# Patient Record
Sex: Female | Born: 1950
Health system: Southern US, Community
[De-identification: ages and names within clinical notes are randomized; demographics above are authoritative.]

## PROBLEM LIST (undated history)

## (undated) ENCOUNTER — Inpatient Hospital Stay: Admission: EM | Payer: Self-pay | Source: Home / Self Care

## (undated) DIAGNOSIS — Z923 Personal history of irradiation: Secondary | ICD-10-CM

## (undated) DIAGNOSIS — IMO0001 Reserved for inherently not codable concepts without codable children: Secondary | ICD-10-CM

## (undated) DIAGNOSIS — K08109 Complete loss of teeth, unspecified cause, unspecified class: Secondary | ICD-10-CM

## (undated) DIAGNOSIS — Z973 Presence of spectacles and contact lenses: Secondary | ICD-10-CM

## (undated) DIAGNOSIS — IMO0002 Reserved for concepts with insufficient information to code with codable children: Secondary | ICD-10-CM

## (undated) DIAGNOSIS — Z972 Presence of dental prosthetic device (complete) (partial): Secondary | ICD-10-CM

## (undated) DIAGNOSIS — M1990 Unspecified osteoarthritis, unspecified site: Secondary | ICD-10-CM

## (undated) DIAGNOSIS — C801 Malignant (primary) neoplasm, unspecified: Secondary | ICD-10-CM

## (undated) HISTORY — DX: Reserved for concepts with insufficient information to code with codable children: IMO0002

## (undated) HISTORY — PX: TUBAL LIGATION: SHX77

## (undated) HISTORY — DX: Reserved for inherently not codable concepts without codable children: IMO0001

## (undated) HISTORY — PX: APPENDECTOMY: SHX54

## (undated) HISTORY — DX: Unspecified osteoarthritis, unspecified site: M19.90

---

## 1999-03-10 ENCOUNTER — Other Ambulatory Visit: Admission: RE | Admit: 1999-03-10 | Discharge: 1999-03-10 | Payer: Self-pay | Admitting: Obstetrics

## 1999-03-23 ENCOUNTER — Ambulatory Visit (HOSPITAL_COMMUNITY): Admission: RE | Admit: 1999-03-23 | Discharge: 1999-03-23 | Payer: Self-pay | Admitting: Obstetrics

## 1999-03-23 ENCOUNTER — Encounter: Payer: Self-pay | Admitting: Obstetrics

## 2000-11-01 ENCOUNTER — Other Ambulatory Visit: Admission: RE | Admit: 2000-11-01 | Discharge: 2000-11-01 | Payer: Self-pay | Admitting: Obstetrics

## 2000-11-08 ENCOUNTER — Ambulatory Visit (HOSPITAL_COMMUNITY): Admission: RE | Admit: 2000-11-08 | Discharge: 2000-11-08 | Payer: Self-pay | Admitting: Obstetrics

## 2000-11-08 ENCOUNTER — Encounter: Payer: Self-pay | Admitting: Obstetrics

## 2002-07-15 ENCOUNTER — Emergency Department (HOSPITAL_COMMUNITY): Admission: EM | Admit: 2002-07-15 | Discharge: 2002-07-15 | Payer: Self-pay | Admitting: Emergency Medicine

## 2002-07-15 ENCOUNTER — Encounter: Payer: Self-pay | Admitting: Emergency Medicine

## 2003-07-13 ENCOUNTER — Emergency Department (HOSPITAL_COMMUNITY): Admission: EM | Admit: 2003-07-13 | Discharge: 2003-07-13 | Payer: Self-pay | Admitting: Emergency Medicine

## 2005-01-31 ENCOUNTER — Emergency Department (HOSPITAL_COMMUNITY): Admission: EM | Admit: 2005-01-31 | Discharge: 2005-01-31 | Payer: Self-pay | Admitting: *Deleted

## 2010-01-30 ENCOUNTER — Emergency Department (HOSPITAL_COMMUNITY): Admission: EM | Admit: 2010-01-30 | Discharge: 2010-01-30 | Payer: Self-pay | Admitting: Emergency Medicine

## 2011-08-01 ENCOUNTER — Emergency Department (HOSPITAL_COMMUNITY)
Admission: EM | Admit: 2011-08-01 | Discharge: 2011-08-01 | Disposition: A | Discharge status: Home / Self Care | Payer: Self-pay | Attending: Emergency Medicine | Admitting: Emergency Medicine

## 2011-08-01 DIAGNOSIS — M545 Low back pain, unspecified: Secondary | ICD-10-CM | POA: Insufficient documentation

## 2012-06-15 ENCOUNTER — Emergency Department (HOSPITAL_BASED_OUTPATIENT_CLINIC_OR_DEPARTMENT_OTHER): Payer: BC Managed Care – PPO

## 2012-06-15 ENCOUNTER — Emergency Department (HOSPITAL_BASED_OUTPATIENT_CLINIC_OR_DEPARTMENT_OTHER)
Admission: EM | Admit: 2012-06-15 | Discharge: 2012-06-15 | Disposition: A | Discharge status: Home / Self Care | Payer: BC Managed Care – PPO | Attending: Emergency Medicine | Admitting: Emergency Medicine

## 2012-06-15 ENCOUNTER — Encounter (HOSPITAL_BASED_OUTPATIENT_CLINIC_OR_DEPARTMENT_OTHER): Payer: Self-pay | Admitting: *Deleted

## 2012-06-15 DIAGNOSIS — S92501A Displaced unspecified fracture of right lesser toe(s), initial encounter for closed fracture: Secondary | ICD-10-CM

## 2012-06-15 DIAGNOSIS — W2209XA Striking against other stationary object, initial encounter: Secondary | ICD-10-CM | POA: Insufficient documentation

## 2012-06-15 DIAGNOSIS — S92919A Unspecified fracture of unspecified toe(s), initial encounter for closed fracture: Secondary | ICD-10-CM | POA: Insufficient documentation

## 2012-06-15 MED ORDER — TRAMADOL HCL 50 MG PO TABS: 50.0000 mg | ORAL_TABLET | Freq: Four times a day (QID) | ORAL | Status: AC | PRN

## 2012-06-15 NOTE — ED Provider Notes (Signed)
Medical screening examination/treatment/procedure(s) were performed by non-physician practitioner and as supervising physician I was immediately available for consultation/collaboration.    Cyndra Numbers, MD 06/15/12 [phone]

## 2012-06-15 NOTE — ED Notes (Signed)
3am she hit her foot against a wall on the way from the bathroom. Right 3rd and 4th toes are painful. Drove herself here.

## 2012-06-15 NOTE — ED Provider Notes (Signed)
History     CSN: [id number]  Arrival date & time 06/15/12  1646   First MD Initiated Contact with Patient 06/15/12 1648      Chief Complaint  Patient presents with  . Foot Pain    (Consider location/radiation/quality/duration/timing/severity/associated sxs/prior treatment) HPI Hx from pt. [name] [name] is a 61 y.o. female presenting with pain to her right third and fourth toes. She states that she was walking back to her bedroom from the bathroom last evening when she hit her foot against the baseboard. She has had persistent pain to the toes since then. She has been able to weight-bear and walk. Pain worsens with these actions. Improves with rest. She has been soaking the foot in Epsom salt and has taken Aleve once with minimal relief of her symptoms. She has not tried ice. Pain aching and constant. No radiation.  History reviewed. No pertinent past medical history.  History reviewed. No pertinent past surgical history.  No family history on file.  History  Substance Use Topics  . Smoking status: Never Smoker   . Smokeless tobacco: Not on file  . Alcohol Use: No    OB History    Grav Para Term Preterm Abortions TAB SAB Ect Mult Living                  Review of Systems as per history of present illness  Allergies  Review of patient's allergies indicates not on file.  Home Medications  No current outpatient prescriptions on file.  BP 135/67  Pulse 66  Temp 98 F (36.7 C) (Oral)  Resp 20  SpO2 100%  Physical Exam  Nursing note and vitals reviewed. Constitutional: She appears well-developed and well-nourished. No distress.  HENT:  Head: Normocephalic and atraumatic.  Neck: Normal range of motion.  Cardiovascular: Normal rate.   Pulmonary/Chest: Effort normal.  Musculoskeletal: Normal range of motion.       R foot: No visible swelling, bruising, or deformity. DP/PT pulses intact. TTP to 3rd and 4th toes, worse to 4th toe. Sensation intact to lt touch. Able  to wiggle toes and wt bear.  Neurological: She is alert.  Skin: Skin is warm and dry. She is not diaphoretic.  Psychiatric: She has a normal mood and affect.    ED Course  Procedures (including critical care time)  Labs Reviewed - No data to display Dg Foot Complete Right  06/15/2012  *RADIOLOGY REPORT*  Clinical Data: History of right foot pain after injury.  RIGHT FOOT COMPLETE - 3+ VIEW  Comparison: None.  Findings: Acute oblique fracture of the diaphysis of the proximal phalanx of the right fourth toe is present.  There is near apposition at the fracture site.  There is near anatomic alignment. There is slight lateral displacement of the distal fracture fragment.  No comminution, dislocation, or other fracture is seen.  IMPRESSION: Fracture of the proximal phalanx of right fourth toe.   Original Report Authenticated By: Crawford Givens, M.D.      No diagnosis found.    MDM  Patient with right third and fourth toe pain after striking the foot on a wall. She has no visible deformity to the foot but is tender to palpation to the third and fourth toes. X-rays reviewed by me and show oblique fracture of the proximal fourth phalanx. Patient's toes buddy taped; she was placed in postop shoe. To f/u with ortho prn. Reasons to return discussed.  Grant Fontana, PA-C 06/15/12 1722

## 2013-04-05 ENCOUNTER — Ambulatory Visit (INDEPENDENT_AMBULATORY_CARE_PROVIDER_SITE_OTHER): Payer: BC Managed Care – PPO | Admitting: Physician Assistant

## 2013-04-05 VITALS — BP 118/70 | HR 77 | Temp 98.0°F | Resp 18 | Ht 64.0 in | Wt 161.8 lb

## 2013-04-05 DIAGNOSIS — H6120 Impacted cerumen, unspecified ear: Secondary | ICD-10-CM

## 2013-04-05 DIAGNOSIS — H6123 Impacted cerumen, bilateral: Secondary | ICD-10-CM

## 2013-04-05 NOTE — Progress Notes (Signed)
  Subjective:    Patient ID: [name] [name], female    DOB: 04/21/1951, 62 y.o.   MRN: [id number]  HPI   [name] [name] is a very pleasant 62 yr old female here because her ears have been stopped up for about a week.  Denies pain, states the ears just feel full.  Unable to pop them.  No URI symptoms  No air travel or scuba diving.  No fever, chills.  No drainage from the ear.  Had something similar about 2 yrs ago.  Has tried otc drops without success.     Review of Systems  Constitutional: Negative for fever and chills.  HENT: Positive for hearing loss and ear pain. Negative for congestion, rhinorrhea and ear discharge.   Respiratory: Negative for cough.   Cardiovascular: Negative.   Gastrointestinal: Negative.   Musculoskeletal: Negative.   Skin: Negative.   Neurological: Negative.        Objective:   Physical Exam  Vitals reviewed. Constitutional: She is oriented to person, place, and time. She appears well-developed and well-nourished. No distress.  HENT:  Head: Normocephalic and atraumatic.  Right Ear: External ear normal.  Left Ear: External ear normal.  bilat ear canals completely occluded with cerumen  Eyes: Conjunctivae are normal. No scleral icterus.  Cardiovascular: Normal rate, regular rhythm and normal heart sounds.   Pulmonary/Chest: Effort normal. She has no wheezes. She has no rales.  Neurological: She is alert and oriented to person, place, and time.  Skin: Skin is warm and dry.  Psychiatric: She has a normal mood and affect. Her behavior is normal.     Bilateral ears irrigated successfully; canals now patent, TMs intact, subjective relief of symptoms     Assessment & Plan:  Cerumen impaction, bilateral   [name] [name] is a very pleasant 62 yr old female with bilateral cerumen impaction.  Ears were successfully irrigated with resolution of symptoms.  Canals patent and TMs clear, no evidence of infection.  Follow up prn.

## 2013-09-26 ENCOUNTER — Other Ambulatory Visit: Payer: Self-pay | Admitting: Family Medicine

## 2013-09-26 DIAGNOSIS — Z1231 Encounter for screening mammogram for malignant neoplasm of breast: Secondary | ICD-10-CM

## 2013-10-29 ENCOUNTER — Ambulatory Visit
Admission: RE | Admit: 2013-10-29 | Discharge: 2013-10-29 | Disposition: A | Discharge status: Home / Self Care | Payer: BC Managed Care – PPO | Source: Ambulatory Visit | Attending: Family Medicine | Admitting: Family Medicine

## 2013-10-29 DIAGNOSIS — Z1231 Encounter for screening mammogram for malignant neoplasm of breast: Secondary | ICD-10-CM

## 2013-10-31 ENCOUNTER — Other Ambulatory Visit: Payer: Self-pay | Admitting: Family Medicine

## 2013-10-31 DIAGNOSIS — R928 Other abnormal and inconclusive findings on diagnostic imaging of breast: Secondary | ICD-10-CM

## 2013-11-18 ENCOUNTER — Other Ambulatory Visit: Payer: Self-pay | Admitting: Family Medicine

## 2013-11-18 ENCOUNTER — Ambulatory Visit
Admission: RE | Admit: 2013-11-18 | Discharge: 2013-11-18 | Disposition: A | Discharge status: Home / Self Care | Payer: BC Managed Care – PPO | Source: Ambulatory Visit | Attending: Physician Assistant | Admitting: Physician Assistant

## 2013-11-18 DIAGNOSIS — R928 Other abnormal and inconclusive findings on diagnostic imaging of breast: Secondary | ICD-10-CM

## 2013-11-18 LAB — PROCEDURE REPORT - SCANNED: PAP SMEAR: NEGATIVE

## 2013-11-19 ENCOUNTER — Ambulatory Visit
Admission: RE | Admit: 2013-11-19 | Discharge: 2013-11-19 | Disposition: A | Discharge status: Home / Self Care | Payer: BC Managed Care – PPO | Source: Ambulatory Visit | Attending: Physician Assistant | Admitting: Physician Assistant

## 2013-11-19 DIAGNOSIS — R928 Other abnormal and inconclusive findings on diagnostic imaging of breast: Secondary | ICD-10-CM

## 2013-11-27 ENCOUNTER — Ambulatory Visit (INDEPENDENT_AMBULATORY_CARE_PROVIDER_SITE_OTHER): Payer: BC Managed Care – PPO | Admitting: General Surgery

## 2013-11-27 ENCOUNTER — Encounter (INDEPENDENT_AMBULATORY_CARE_PROVIDER_SITE_OTHER): Payer: Self-pay | Admitting: General Surgery

## 2013-11-27 VITALS — BP 120/70 | HR 64 | Resp 18 | Ht 64.0 in | Wt 162.8 lb

## 2013-11-27 DIAGNOSIS — N63 Unspecified lump in unspecified breast: Secondary | ICD-10-CM

## 2013-11-27 DIAGNOSIS — N6310 Unspecified lump in the right breast, unspecified quadrant: Secondary | ICD-10-CM

## 2013-11-27 NOTE — Patient Instructions (Signed)
You had developed a large mass in your right breast laterally.  Recent biopsy shows atypical cells, but there  no obvious cancer cells were  seen.   This may or may not be a breast cancer.  The next step is to conservatively excise this area as described and have it completely analyzed in the pathology lab.  Further decisions will be made based on those lab tests.  You'll be scheduled for a right lumpectomy in the near future.     Lumpectomy A lumpectomy is a form of "breast conserving" or "breast preservation" surgery. It may also be referred to as a partial mastectomy. During a lumpectomy, the portion of the breast that contains the cancerous tumor or breast mass (the lump) is removed. Some normal tissue around the lump may also be removed to make sure all the tumor has been removed. This surgery should take 40 minutes or less. LET [facility] CARE PROVIDER KNOW ABOUT:  Any allergies you have.  All medicines you are taking, including vitamins, herbs, eye drops, creams, and over-the-counter medicines.  Previous problems you or members of your family have had with the use of anesthetics.  Any blood disorders you have.  Previous surgeries you have had.  Medical conditions you have. RISKS AND COMPLICATIONS Generally, this is a safe procedure. However, as with any procedure, complications can occur. Possible complications include:  Bleeding.  Infection.  Pain.  Temporary swelling.  Change in the shape of the breast, particularly if a large portion is removed. BEFORE THE PROCEDURE  Ask your health care provider about changing or stopping your regular medicines.  Do not eat or drink anything for 7 8 hours before the surgery or as directed by your health care provider. Ask your health care provider if you can take a sip of water with any approved medicines.  On the day of surgery, your healthcare provider will use a mammogram or ultrasound to locate and mark the tumor in  your breast. These markings on your breast will show where the cut (incision) will be made. PROCEDURE   An IV tube will be put into one of your veins.  You may be given medicine to help you relax before the surgery (sedative). You will be given one of the following:  A medicine that numbs the area (local anesthesia).  A medicine that makes you go to sleep (general anesthesia).  Your health care provider will use a kind of electric scalpel that uses heat to minimize bleeding (electrocautery knife).  A curved incision (like a smile or frown) that follows the natural curve of your breast is made, to allow for minimal scarring and better healing.  The tumor will be removed with some of the surrounding tissue. This will be sent to the lab for analysis. Your health care provider may also remove your lymph nodes at this time if needed.  Sometimes, but not always, a rubber tube called a drain will be surgically inserted into your breast area or armpit to collect excess fluid that may accumulate in the space where the tumor was. This drain is connected to a plastic bulb on the outside of your body. This drain creates suction to help remove the fluid.  The incisions will be closed with stitches (sutures).  A bandage may be placed over the incisions. AFTER THE PROCEDURE  You will be taken to the recovery area.  You will be given medicine for pain.  A small rubber drain may be placed in the breast  for 2 3 days to prevent a collection of blood (hematoma) from developing in the breast. You will be given instructions on caring for the drain before you go home.  A pressure bandage (dressing) will be applied for 1 2 days to prevent bleeding. Ask your health care provider how to care for your bandage at home. Document Released: 11/14/2006 Document Revised: 06/05/2013 Document Reviewed: 03/08/2013 [facility] Patient Information 2014 [place].

## 2013-11-27 NOTE — Progress Notes (Signed)
Patient ID: [name] [name], female   DOB: 10-Aug-1951, 63 y.o.   MRN: [id number]  Chief Complaint  Patient presents with  . papillary lesion of breast    HPI [name] [name] is a 63 y.o. female.  She is referred by Dr. Luberta Robertson at the breast center agrees for a for evaluation and management of a palpable mass in the right breast at the 9:00 position.  Dr. Ruthann Cancer is her gynecologist. Dr. Kreg Shropshire is her PCP.  The patient has no prior history of breast disease. Her last Mammogram was 12 years ago.  Stay she states that she felt a lump in the lateral right breast 2 months ago. Occasional pain. No discharge. Imaging studies showed a 6 cm irregular mass in the upper-outer quadrant of the right breast. The radiologist describes a palpable mass in the right breast at the 9 clock position. Ultrasound of the right axilla is normal. Imaging studies the left breast showed a benign lymph node at the 3:00 position of the left breast. Image guided biopsy of the right breast mass shows atypical papillary lesion. Atypical ductal hyperplasia could not be ruled out. Breast cancer could not be ruled out.  Past history significant for open appendectomy, tubal ligation, otherwise healthy. Takes no medications.  Family history is negative for breast or ovarian cancer.  Social history she is a widow. 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  HPI  Past Medical History  Diagnosis Date  . Arthritis     right knee    Past Surgical History  Procedure Laterality Date  . Appendectomy      History reviewed. No pertinent family history.  Social History History  Substance Use Topics  . Smoking status: Former Research scientist (life sciences)  . Smokeless tobacco: Not on file  . Alcohol Use: No    Allergies  Allergen Reactions  . Codeine Nausea And Vomiting    Current Outpatient Prescriptions  Medication Sig Dispense Refill  . naproxen sodium (ANAPROX) 220 MG tablet Take 220 mg by  mouth 2 (two) times daily with a meal. For pain.       No current facility-administered medications for this visit.    Review of Systems Review of Systems  Constitutional: Negative for fever, chills and unexpected weight change.  HENT: Negative for congestion, hearing loss, sore throat, trouble swallowing and voice change.   Eyes: Negative for visual disturbance.  Respiratory: Negative for cough and wheezing.   Cardiovascular: Negative for chest pain, palpitations and leg swelling.  Gastrointestinal: Negative for nausea, vomiting, abdominal pain, diarrhea, constipation, blood in stool, abdominal distention and anal bleeding.  Genitourinary: Negative for hematuria, vaginal bleeding and difficulty urinating.  Musculoskeletal: Negative for arthralgias.  Skin: Negative for rash and wound.  Neurological: Negative for seizures, syncope and headaches.  Hematological: Negative for adenopathy. Does not bruise/bleed easily.  Psychiatric/Behavioral: Negative for confusion.    Blood pressure 120/70, pulse 64, resp. rate 18, height 5\' 4"  (1.626 m), weight 162 lb 12.8 oz (73.846 kg).  Physical Exam Physical Exam  Constitutional: She is oriented to person, place, and time. She appears well-developed and well-nourished. No distress.  HENT:  Head: Normocephalic and atraumatic.  Nose: Nose normal.  Mouth/Throat: No oropharyngeal exudate.  Eyes: Conjunctivae and EOM are normal. Pupils are equal, round, and reactive to light. Left eye exhibits no discharge. No scleral icterus.  Neck: Neck supple. No JVD present. No tracheal deviation present. No thyromegaly present.  Cardiovascular: Normal rate, regular  rhythm, normal heart sounds and intact distal pulses.   No murmur heard. Pulmonary/Chest: Effort normal and breath sounds normal. No respiratory distress. She has no wheezes. She has no rales. She exhibits no tenderness.    6 cm transversely by 4 cm vertically mobile, palpable, nontender mass right  breast 9:00 position. This does not appear to be fixed or involving skin or pectoralis muscle. No skin changes in either breast. No other mass in either breast. No axillary adenopathy.breast size 38 D.  Abdominal: Soft. Bowel sounds are normal. She exhibits no distension and no mass. There is no tenderness. There is no rebound and no guarding.    Vertical scar, infraumbilical area. Right lower quadrant scar.  Musculoskeletal: She exhibits no edema and no tenderness.  Lymphadenopathy:    She has no cervical adenopathy.  Neurological: She is alert and oriented to person, place, and time. She exhibits normal muscle tone. Coordination normal.  Skin: Skin is warm. No rash noted. She is not diaphoretic. No erythema. No pallor.  Psychiatric: She has a normal mood and affect. Her behavior is normal. Judgment and thought content normal.    Data Reviewed Imaging studies. Pathology report  Assessment    Large right breast mass, 9:00 position, palpable. Due to the size and physical findings this must be considered to be breast cancer until proven otherwise  History open appendectomy  History tubal ligation     Plan    Scheduled for right lumpectomy under general anesthesia in the near future. Since this is so clearly palpable, there is no indication for any localization technique. We will try to achieve conservative but negative margin due to the possibility of malignancy.  I discussed the indications, details, techniques, and numerous risk of the surgery with her. She is aware of the risk of bleeding, infection, cosmetic deformity, skin necrosis, chronic pain, reoperation if this turns out to be a cancer. She understands all these issues well. All questions are answered. She agrees with this plan.        Edsel Petrin. Dalbert Batman, M.D., [facility] General and Minimally invasive Surgery Breast and Colorectal Surgery Office:   [phone] Pager:    [phone]  11/27/2013, 2:33 PM

## 2013-12-10 ENCOUNTER — Encounter (HOSPITAL_BASED_OUTPATIENT_CLINIC_OR_DEPARTMENT_OTHER): Payer: Self-pay | Admitting: *Deleted

## 2013-12-10 NOTE — Progress Notes (Signed)
To come in for [facility] labs and ua

## 2013-12-11 ENCOUNTER — Encounter (HOSPITAL_BASED_OUTPATIENT_CLINIC_OR_DEPARTMENT_OTHER)
Admission: RE | Admit: 2013-12-11 | Discharge: 2013-12-11 | Disposition: A | Discharge status: Home / Self Care | Payer: BC Managed Care – PPO | Source: Ambulatory Visit | Attending: General Surgery | Admitting: General Surgery

## 2013-12-11 DIAGNOSIS — Z01812 Encounter for preprocedural laboratory examination: Secondary | ICD-10-CM | POA: Insufficient documentation

## 2013-12-11 LAB — COMPREHENSIVE METABOLIC PANEL
ALK PHOS: 94 U/L (ref 39–117)
ALT: 15 U/L (ref 0–35)
AST: 20 U/L (ref 0–37)
Albumin: 4.3 g/dL (ref 3.5–5.2)
BILIRUBIN TOTAL: 0.4 mg/dL (ref 0.3–1.2)
BUN: 23 mg/dL (ref 6–23)
CHLORIDE: 103 meq/L (ref 96–112)
CO2: 27 mEq/L (ref 19–32)
Calcium: 9.8 mg/dL (ref 8.4–10.5)
Creatinine, Ser: 0.93 mg/dL (ref 0.50–1.10)
GFR calc Af Amer: 74 mL/min — ABNORMAL LOW (ref >90)
GFR calc non Af Amer: 64 mL/min — ABNORMAL LOW (ref >90)
Glucose, Bld: 91 mg/dL (ref 70–99)
POTASSIUM: 4.6 meq/L (ref 3.7–5.3)
Sodium: 142 mEq/L (ref 137–147)
Total Protein: 7.9 g/dL (ref 6.0–8.3)

## 2013-12-11 LAB — CBC WITH DIFFERENTIAL/PLATELET
BASOS PCT: 0 % (ref 0–1)
Basophils Absolute: 0 10*3/uL (ref 0.0–0.1)
Eosinophils Absolute: 0.1 10*3/uL (ref 0.0–0.7)
Eosinophils Relative: 2 % (ref 0–5)
HCT: 39.7 % (ref 36.0–46.0)
HEMOGLOBIN: 13.4 g/dL (ref 12.0–15.0)
LYMPHS ABS: 1.7 10*3/uL (ref 0.7–4.0)
Lymphocytes Relative: 33 % (ref 12–46)
MCH: 27.5 pg (ref 26.0–34.0)
MCHC: 33.8 g/dL (ref 30.0–36.0)
MCV: 81.5 fL (ref 78.0–100.0)
MONOS PCT: 7 % (ref 3–12)
Monocytes Absolute: 0.4 10*3/uL (ref 0.1–1.0)
NEUTROS ABS: 3 10*3/uL (ref 1.7–7.7)
Neutrophils Relative %: 58 % (ref 43–77)
PLATELETS: 170 10*3/uL (ref 150–400)
RBC: 4.87 MIL/uL (ref 3.87–5.11)
RDW: 13.2 % (ref 11.5–15.5)
WBC: 5.2 10*3/uL (ref 4.0–10.5)

## 2013-12-11 LAB — URINALYSIS, ROUTINE W REFLEX MICROSCOPIC
BILIRUBIN URINE: NEGATIVE
GLUCOSE, UA: NEGATIVE mg/dL
HGB URINE DIPSTICK: NEGATIVE
KETONES UR: NEGATIVE mg/dL
Nitrite: NEGATIVE
PH: 6.5 (ref 5.0–8.0)
PROTEIN: NEGATIVE mg/dL
Specific Gravity, Urine: 1.029 (ref 1.005–1.030)
Urobilinogen, UA: 1 mg/dL (ref 0.0–1.0)

## 2013-12-11 LAB — URINE MICROSCOPIC-ADD ON

## 2013-12-15 NOTE — H&P (Signed)
[name] [name]   MRN:  [id number]   Description: 63 year old female  Provider: Ernestene Mention, MD  Department: [facility]              Diagnoses      Breast mass, right    -  Primary      [phone]       Reason for Visit      papillary lesion of breast        Current Vitals - Last Recorded      BP Pulse Resp Ht Wt BMI      120/70 64 18 5\' 4"  (1.626 m) 162 lb 12.8 oz (73.846 kg) 27.93 kg/m2            History and Physical   Ernestene Mention, MD     Status: Signed            Patient ID: [name] [name], female   DOB: [birth year], 63 y.o.   MRN: [id number]     Chief Complaint   Patient presents with   .  papillary lesion of breast               HPI [name] [name] is a 63 y.o. female.  She is referred by Dr. Anselmo Pickler at the breast center agrees for a for evaluation and management of a palpable mass in the right breast at the 9:00 position.  Dr. Gaynell [name] is her gynecologist. Dr. Hiram Gash is her PCP.   The patient has no prior history of breast disease. Her last Mammogram was 12 years ago.  Stay she states that she felt a lump in the lateral right breast 2 months ago. Occasional pain. No discharge. Imaging studies showed a 6 cm irregular mass in the upper-outer quadrant of the right breast. The radiologist describes a palpable mass in the right breast at the 9 clock position. Ultrasound of the right axilla is normal. Imaging studies the left breast showed a benign lymph node at the 3:00 position of the left breast. Image guided biopsy of the right breast mass shows atypical papillary lesion. Atypical ductal hyperplasia could not be ruled out. Breast cancer could not be ruled out.   Past history significant for open appendectomy, tubal ligation, otherwise healthy. Takes no medications.   Family history is negative for breast or ovarian cancer.   Social history she is a widow. 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         Past Medical History   Diagnosis  Date   .  Arthritis         right knee         Past Surgical History   Procedure  Laterality  Date   .  Appendectomy            History reviewed. No pertinent family history.   Social History History   Substance Use Topics   .  Smoking status:  Former Games developer   .  Smokeless tobacco:  Not on file   .  Alcohol Use:  No         Allergies   Allergen  Reactions   .  Codeine  Nausea And Vomiting         Current Outpatient Prescriptions   Medication  Sig  Dispense  Refill   .  naproxen sodium (ANAPROX) 220 MG tablet  Take 220 mg by mouth 2 (two) times  daily with a meal. For pain.                     Review of Systems  Constitutional: Negative for fever, chills and unexpected weight change.  HENT: Negative for congestion, hearing loss, sore throat, trouble swallowing and voice change.   Eyes: Negative for visual disturbance.  Respiratory: Negative for cough and wheezing.   Cardiovascular: Negative for chest pain, palpitations and leg swelling.  Gastrointestinal: Negative for nausea, vomiting, abdominal pain, diarrhea, constipation, blood in stool, abdominal distention and anal bleeding.  Genitourinary: Negative for hematuria, vaginal bleeding and difficulty urinating.  Musculoskeletal: Negative for arthralgias.  Skin: Negative for rash and wound.  Neurological: Negative for seizures, syncope and headaches.  Hematological: Negative for adenopathy. Does not bruise/bleed easily.  Psychiatric/Behavioral: Negative for confusion.      Blood pressure 120/70, pulse 64, resp. rate 18, height 5\' 4"  (1.626 m), weight 162 lb 12.8 oz (73.846 kg).   Physical Exam  Constitutional: She is oriented to person, place, and time. She appears well-developed and well-nourished. No distress.  HENT:   Head: Normocephalic and atraumatic.   Nose: Nose normal.   Mouth/Throat: No oropharyngeal exudate.  Eyes: Conjunctivae  and EOM are normal. Pupils are equal, round, and reactive to light. Left eye exhibits no discharge. No scleral icterus.  Neck: Neck supple. No JVD present. No tracheal deviation present. No thyromegaly present.  Cardiovascular: Normal rate, regular rhythm, normal heart sounds and intact distal pulses.    No murmur heard. Pulmonary/Chest: Effort normal and breath sounds normal. No respiratory distress. She has no wheezes. She has no rales. She exhibits no tenderness.    6 cm transversely by 4 cm vertically mobile, palpable, nontender mass right breast 9:00 position. This does not appear to be fixed or involving skin or pectoralis muscle. No skin changes in either breast. No other mass in either breast. No axillary adenopathy.breast size 38 D.  Abdominal: Soft. Bowel sounds are normal. She exhibits no distension and no mass. There is no tenderness. There is no rebound and no guarding.    Vertical scar, infraumbilical area. Right lower quadrant scar.  Musculoskeletal: She exhibits no edema and no tenderness.  Lymphadenopathy:    She has no cervical adenopathy.  Neurological: She is alert and oriented to person, place, and time. She exhibits normal muscle tone. Coordination normal.  Skin: Skin is warm. No rash noted. She is not diaphoretic. No erythema. No pallor.  Psychiatric: She has a normal mood and affect. Her behavior is normal. Judgment and thought content normal.      Data Reviewed Imaging studies. Pathology report   Assessment    Large right breast mass, 9:00 position, palpable. Due to the size and physical findings this must be considered to be breast cancer until proven otherwise   History open appendectomy   History tubal ligation      Plan    Scheduled for right lumpectomy under general anesthesia in the near future. Since this is so clearly palpable, there is no indication for any localization technique. We will try to achieve conservative but negative margin due to  the possibility of malignancy.   I discussed the indications, details, techniques, and numerous risk of the surgery with her. She is aware of the risk of bleeding, infection, cosmetic deformity, skin necrosis, chronic pain, reoperation if this turns out to be a cancer. She understands all these issues well. All questions are answered. She agrees with this plan.  Edsel Petrin. Dalbert Batman, M.D., [facility] General and Minimally invasive Surgery Breast and Colorectal Surgery Office:   [phone] Pager:   [phone]

## 2013-12-16 ENCOUNTER — Ambulatory Visit (HOSPITAL_BASED_OUTPATIENT_CLINIC_OR_DEPARTMENT_OTHER): Payer: BC Managed Care – PPO | Admitting: Anesthesiology

## 2013-12-16 ENCOUNTER — Encounter (HOSPITAL_BASED_OUTPATIENT_CLINIC_OR_DEPARTMENT_OTHER)
Admission: RE | Disposition: A | Discharge status: Home / Self Care | Payer: Self-pay | Source: Ambulatory Visit | Attending: General Surgery

## 2013-12-16 ENCOUNTER — Encounter (HOSPITAL_BASED_OUTPATIENT_CLINIC_OR_DEPARTMENT_OTHER): Payer: Self-pay | Admitting: *Deleted

## 2013-12-16 ENCOUNTER — Encounter (HOSPITAL_BASED_OUTPATIENT_CLINIC_OR_DEPARTMENT_OTHER): Payer: BC Managed Care – PPO | Admitting: Anesthesiology

## 2013-12-16 ENCOUNTER — Ambulatory Visit (HOSPITAL_BASED_OUTPATIENT_CLINIC_OR_DEPARTMENT_OTHER)
Admission: RE | Admit: 2013-12-16 | Discharge: 2013-12-16 | Disposition: A | Discharge status: Home / Self Care | Payer: BC Managed Care – PPO | Source: Ambulatory Visit | Attending: General Surgery | Admitting: General Surgery

## 2013-12-16 DIAGNOSIS — N6310 Unspecified lump in the right breast, unspecified quadrant: Secondary | ICD-10-CM

## 2013-12-16 DIAGNOSIS — C50419 Malignant neoplasm of upper-outer quadrant of unspecified female breast: Secondary | ICD-10-CM | POA: Insufficient documentation

## 2013-12-16 DIAGNOSIS — Z87891 Personal history of nicotine dependence: Secondary | ICD-10-CM | POA: Insufficient documentation

## 2013-12-16 DIAGNOSIS — C50919 Malignant neoplasm of unspecified site of unspecified female breast: Secondary | ICD-10-CM

## 2013-12-16 HISTORY — DX: Complete loss of teeth, unspecified cause, unspecified class: K08.109

## 2013-12-16 HISTORY — DX: Presence of spectacles and contact lenses: Z97.3

## 2013-12-16 HISTORY — PX: BREAST LUMPECTOMY: SHX2

## 2013-12-16 HISTORY — DX: Presence of dental prosthetic device (complete) (partial): Z97.2

## 2013-12-16 SURGERY — BREAST LUMPECTOMY
Anesthesia: Regional | Site: Breast | Laterality: Right

## 2013-12-16 MED ORDER — ACETAMINOPHEN 650 MG RE SUPP: 650.0000 mg | RECTAL | Status: DC | PRN

## 2013-12-16 MED ORDER — CEFAZOLIN SODIUM-DEXTROSE 2-3 GM-% IV SOLR
2.0000 g | INTRAVENOUS | Status: AC
  Administered 2013-12-16: 2 g via INTRAVENOUS

## 2013-12-16 MED ORDER — MIDAZOLAM HCL 2 MG/2ML IJ SOLN
INTRAMUSCULAR | Status: AC
  Filled 2013-12-16: qty 2

## 2013-12-16 MED ORDER — SODIUM CHLORIDE 0.9 % IJ SOLN
3.0000 mL | INTRAMUSCULAR | Status: DC | PRN
Start: 2013-12-16 — End: 2013-12-16

## 2013-12-16 MED ORDER — FENTANYL CITRATE 0.05 MG/ML IJ SOLN
INTRAMUSCULAR | Status: AC
  Filled 2013-12-16: qty 6

## 2013-12-16 MED ORDER — SODIUM CHLORIDE 0.9 % IJ SOLN: 3.0000 mL | Freq: Two times a day (BID) | INTRAMUSCULAR | Status: DC

## 2013-12-16 MED ORDER — FENTANYL CITRATE 0.05 MG/ML IJ SOLN: 25.0000 ug | INTRAMUSCULAR | Status: DC | PRN

## 2013-12-16 MED ORDER — BUPIVACAINE-EPINEPHRINE PF 0.5-1:200000 % IJ SOLN
INTRAMUSCULAR | Status: AC
  Filled 2013-12-16: qty 30

## 2013-12-16 MED ORDER — LACTATED RINGERS IV SOLN
INTRAVENOUS | Status: DC
  Administered 2013-12-16: 20 mL/h via INTRAVENOUS
  Administered 2013-12-16: 09:00:00 via INTRAVENOUS

## 2013-12-16 MED ORDER — CEFAZOLIN SODIUM-DEXTROSE 2-3 GM-% IV SOLR
INTRAVENOUS | Status: AC
  Filled 2013-12-16: qty 50

## 2013-12-16 MED ORDER — ONDANSETRON HCL 4 MG/2ML IJ SOLN
INTRAMUSCULAR | Status: DC | PRN
  Administered 2013-12-16: 4 mg via INTRAVENOUS

## 2013-12-16 MED ORDER — FENTANYL CITRATE 0.05 MG/ML IJ SOLN
50.0000 ug | INTRAMUSCULAR | Status: DC | PRN
  Administered 2013-12-16: 100 ug via INTRAVENOUS

## 2013-12-16 MED ORDER — HYDROCODONE-ACETAMINOPHEN 5-325 MG PO TABS: 1.0000 | ORAL_TABLET | ORAL | Status: DC | PRN

## 2013-12-16 MED ORDER — SODIUM CHLORIDE 0.9 % IV SOLN: 250.0000 mL | INTRAVENOUS | Status: DC | PRN

## 2013-12-16 MED ORDER — LIDOCAINE HCL (CARDIAC) 20 MG/ML IV SOLN
INTRAVENOUS | Status: DC | PRN
  Administered 2013-12-16: 50 mg via INTRAVENOUS

## 2013-12-16 MED ORDER — ACETAMINOPHEN 325 MG PO TABS: 650.0000 mg | ORAL_TABLET | ORAL | Status: DC | PRN

## 2013-12-16 MED ORDER — OXYCODONE HCL 5 MG PO TABS: 5.0000 mg | ORAL_TABLET | Freq: Once | ORAL | Status: DC | PRN

## 2013-12-16 MED ORDER — DEXAMETHASONE SODIUM PHOSPHATE 4 MG/ML IJ SOLN
INTRAMUSCULAR | Status: DC | PRN
  Administered 2013-12-16: 10 mg via INTRAVENOUS

## 2013-12-16 MED ORDER — SODIUM CHLORIDE 0.9 % IV SOLN: INTRAVENOUS | Status: DC

## 2013-12-16 MED ORDER — HYDROMORPHONE HCL PF 1 MG/ML IJ SOLN: 0.2500 mg | INTRAMUSCULAR | Status: DC | PRN

## 2013-12-16 MED ORDER — BUPIVACAINE-EPINEPHRINE PF 0.5-1:200000 % IJ SOLN
INTRAMUSCULAR | Status: DC | PRN
  Administered 2013-12-16: 30 mL

## 2013-12-16 MED ORDER — CHLORHEXIDINE GLUCONATE 4 % EX LIQD: 1.0000 "application " | Freq: Once | CUTANEOUS | Status: DC

## 2013-12-16 MED ORDER — ONDANSETRON HCL 4 MG/2ML IJ SOLN
4.0000 mg | Freq: Four times a day (QID) | INTRAMUSCULAR | Status: DC | PRN
Start: 2013-12-16 — End: 2013-12-16

## 2013-12-16 MED ORDER — PROPOFOL 10 MG/ML IV BOLUS
INTRAVENOUS | Status: DC | PRN
  Administered 2013-12-16: 150 mg via INTRAVENOUS

## 2013-12-16 MED ORDER — BUPIVACAINE-EPINEPHRINE 0.5% -1:200000 IJ SOLN
INTRAMUSCULAR | Status: DC | PRN
  Administered 2013-12-16: 5 mL

## 2013-12-16 MED ORDER — MIDAZOLAM HCL 2 MG/2ML IJ SOLN
1.0000 mg | INTRAMUSCULAR | Status: DC | PRN
Start: 2013-12-16 — End: 2013-12-16
  Administered 2013-12-16: 1 mg via INTRAVENOUS

## 2013-12-16 MED ORDER — OXYCODONE HCL 5 MG/5ML PO SOLN: 5.0000 mg | Freq: Once | ORAL | Status: DC | PRN

## 2013-12-16 MED ORDER — EPHEDRINE SULFATE 50 MG/ML IJ SOLN
INTRAMUSCULAR | Status: DC | PRN
  Administered 2013-12-16: 10 mg via INTRAVENOUS

## 2013-12-16 MED ORDER — OXYCODONE HCL 5 MG PO TABS
5.0000 mg | ORAL_TABLET | ORAL | Status: DC | PRN
Start: 2013-12-16 — End: 2013-12-16

## 2013-12-16 MED ORDER — FENTANYL CITRATE 0.05 MG/ML IJ SOLN
INTRAMUSCULAR | Status: AC
  Filled 2013-12-16: qty 2

## 2013-12-16 SURGICAL SUPPLY — 63 items
ADH SKN CLS APL DERMABOND .7 (GAUZE/BANDAGES/DRESSINGS) ×1
APL SKNCLS STERI-STRIP NONHPOA (GAUZE/BANDAGES/DRESSINGS)
APPLIER CLIP 9.375 MED OPEN (MISCELLANEOUS)
APR CLP MED 9.3 20 MLT OPN (MISCELLANEOUS)
BANDAGE ELASTIC 6 VELCRO ST LF (GAUZE/BANDAGES/DRESSINGS) IMPLANT
BENZOIN TINCTURE PRP APPL 2/3 (GAUZE/BANDAGES/DRESSINGS) IMPLANT
BINDER BREAST XLRG (GAUZE/BANDAGES/DRESSINGS) ×1 IMPLANT
BLADE SURG 15 STRL LF DISP TIS (BLADE) ×2 IMPLANT
BLADE SURG 15 STRL SS (BLADE) ×2
CANISTER SUCT 1200ML W/VALVE (MISCELLANEOUS) ×2 IMPLANT
CHLORAPREP W/TINT 26ML (MISCELLANEOUS) ×2 IMPLANT
CLIP APPLIE 9.375 MED OPEN (MISCELLANEOUS) IMPLANT
COVER MAYO STAND STRL (DRAPES) ×2 IMPLANT
COVER TABLE BACK 60X90 (DRAPES) ×2 IMPLANT
DECANTER SPIKE VIAL GLASS SM (MISCELLANEOUS) IMPLANT
DERMABOND ADVANCED (GAUZE/BANDAGES/DRESSINGS) ×1
DERMABOND ADVANCED .7 DNX12 (GAUZE/BANDAGES/DRESSINGS) IMPLANT
DEVICE DUBIN W/COMP PLATE 8390 (MISCELLANEOUS) IMPLANT
DRAPE LAPAROSCOPIC ABDOMINAL (DRAPES) IMPLANT
DRAPE LAPAROTOMY TRNSV 102X78 (DRAPE) IMPLANT
DRAPE PED LAPAROTOMY (DRAPES) ×2 IMPLANT
DRAPE UTILITY XL STRL (DRAPES) ×2 IMPLANT
ELECT REM PT RETURN 9FT ADLT (ELECTROSURGICAL) ×2
ELECTRODE REM PT RTRN 9FT ADLT (ELECTROSURGICAL) ×1 IMPLANT
GAUZE SPONGE 4X4 16PLY XRAY LF (GAUZE/BANDAGES/DRESSINGS) IMPLANT
GLOVE BIO SURGEON STRL SZ7 (GLOVE) ×1 IMPLANT
GLOVE BIOGEL PI IND STRL 7.5 (GLOVE) IMPLANT
GLOVE BIOGEL PI INDICATOR 7.5 (GLOVE) ×1
GLOVE EUDERMIC 7 POWDERFREE (GLOVE) ×2 IMPLANT
GOWN STRL REUS W/ TWL LRG LVL3 (GOWN DISPOSABLE) ×1 IMPLANT
GOWN STRL REUS W/ TWL XL LVL3 (GOWN DISPOSABLE) ×1 IMPLANT
GOWN STRL REUS W/TWL LRG LVL3 (GOWN DISPOSABLE) ×2
GOWN STRL REUS W/TWL XL LVL3 (GOWN DISPOSABLE) ×2
KIT MARKER MARGIN INK (KITS) IMPLANT
NDL HYPO 25X1 1.5 SAFETY (NEEDLE) ×1 IMPLANT
NEEDLE HYPO 22GX1.5 SAFETY (NEEDLE) IMPLANT
NEEDLE HYPO 25X1 1.5 SAFETY (NEEDLE) ×2 IMPLANT
NS IRRIG 1000ML POUR BTL (IV SOLUTION) ×2 IMPLANT
PACK BASIN DAY SURGERY FS (CUSTOM PROCEDURE TRAY) ×2 IMPLANT
PENCIL BUTTON HOLSTER BLD 10FT (ELECTRODE) ×2 IMPLANT
SLEEVE SCD COMPRESS KNEE MED (MISCELLANEOUS) ×1 IMPLANT
SPONGE GAUZE 4X4 12PLY STER LF (GAUZE/BANDAGES/DRESSINGS) IMPLANT
SPONGE LAP 4X18 X RAY DECT (DISPOSABLE) ×2 IMPLANT
STAPLER VISISTAT 35W (STAPLE) IMPLANT
STRIP CLOSURE SKIN 1/2X4 (GAUZE/BANDAGES/DRESSINGS) IMPLANT
SUT ETHILON 4 0 PS 2 18 (SUTURE) IMPLANT
SUT MNCRL AB 4-0 PS2 18 (SUTURE) ×1 IMPLANT
SUT SILK 2 0 SH (SUTURE) ×2 IMPLANT
SUT VIC AB 2-0 CT1 27 (SUTURE) ×6
SUT VIC AB 2-0 CT1 TAPERPNT 27 (SUTURE) IMPLANT
SUT VIC AB 2-0 SH 27 (SUTURE)
SUT VIC AB 2-0 SH 27XBRD (SUTURE) IMPLANT
SUT VIC AB 3-0 FS2 27 (SUTURE) IMPLANT
SUT VIC AB 4-0 P-3 18XBRD (SUTURE) IMPLANT
SUT VIC AB 4-0 P3 18 (SUTURE)
SUT VICRYL 3-0 CR8 SH (SUTURE) ×2 IMPLANT
SUT VICRYL 4-0 PS2 18IN ABS (SUTURE) IMPLANT
SYR BULB 3OZ (MISCELLANEOUS) IMPLANT
SYR CONTROL 10ML LL (SYRINGE) ×2 IMPLANT
TAPE HYPAFIX 4 X10 (GAUZE/BANDAGES/DRESSINGS) IMPLANT
TOWEL OR NON WOVEN STRL DISP B (DISPOSABLE) ×2 IMPLANT
TUBE CONNECTING 20X1/4 (TUBING) ×2 IMPLANT
YANKAUER SUCT BULB TIP NO VENT (SUCTIONS) ×2 IMPLANT

## 2013-12-16 NOTE — Transfer of Care (Signed)
Immediate Anesthesia Transfer of Care Note  Patient: [name] [name]  Procedure(s) Performed: Procedure(s): LUMPECTOMY (Right)  Patient Location: PACU  Anesthesia Type:General  Level of Consciousness: awake and alert   Airway & Oxygen Therapy: Patient Spontanous Breathing and Patient connected to face mask oxygen  Post-op Assessment: Report given to PACU RN and Post -op Vital signs reviewed and stable  Post vital signs: Reviewed and stable  Complications: No apparent anesthesia complications

## 2013-12-16 NOTE — Anesthesia Postprocedure Evaluation (Signed)
  Anesthesia Post-op Note  Patient: [name] [name]  Procedure(s) Performed: Procedure(s): LUMPECTOMY (Right)  Patient Location: PACU  Anesthesia Type:General and PECS block  Level of Consciousness: awake and alert   Airway and Oxygen Therapy: Patient Spontanous Breathing  Post-op Pain: none  Post-op Assessment: Post-op Vital signs reviewed, Patient's Cardiovascular Status Stable and Respiratory Function Stable  Post-op Vital Signs: Reviewed  Filed Vitals:   12/16/13 1148  BP: 132/71  Pulse: 68  Temp: 36.6 C  Resp: 14    Complications: No apparent anesthesia complications

## 2013-12-16 NOTE — Anesthesia Procedure Notes (Addendum)
Anesthesia Regional Block:  Pectoralis block  Pre-Anesthetic Checklist: ,, timeout performed, Correct Patient, Correct Site, Correct Laterality, Correct Procedure, Correct Position, site marked, Risks and benefits discussed, pre-op evaluation, post-op pain management  Laterality: Right  Prep: Maximum Sterile Barrier Precautions used and chloraprep       Needles:  Injection technique: Single-shot  Needle Type: Echogenic Stimulator Needle     Needle Length: 9cm 9 cm Needle Gauge: 21 and 21 G    Additional Needles:  Procedures: ultrasound guided (picture in chart) Pectoralis block Narrative:  Start time: 12/16/2013 8:57 AM End time: 12/16/2013 9:07 AM Injection made incrementally with aspirations every 5 mL. Anesthesiologist: Fitzgerald,MD  Additional Notes: 2% Lidocaine skin wheel.   Procedure Name: LMA Insertion Date/Time: 12/16/2013 9:32 AM Performed by: Melynda Ripple D Pre-anesthesia Checklist: Patient identified, Emergency Drugs available, Suction available and Patient being monitored Patient Re-evaluated:Patient Re-evaluated prior to inductionOxygen Delivery Method: Circle System Utilized Preoxygenation: Pre-oxygenation with 100% oxygen Intubation Type: IV induction Ventilation: Mask ventilation without difficulty LMA: LMA inserted LMA Size: 4.0 Number of attempts: 1 Airway Equipment and Method: bite block Placement Confirmation: positive ETCO2 Tube secured with: Tape Dental Injury: Teeth and Oropharynx as per pre-operative assessment

## 2013-12-16 NOTE — Op Note (Signed)
Patient Name:           [name] [name]   Date of Surgery:        12/16/2013  Pre op Diagnosis:      Right breast mass, 9:00 position, 6 cm diameter  Post op Diagnosis:    Same  Procedure:                 Right partial mastectomy with margin assessment  Surgeon:                     Edsel Petrin. Dalbert Batman, M.D., FACS  Assistant:                      None   Operative Indications:   [name] is a 63 y.o. female. She is referred by Dr. Luberta Robertson at the breast center agrees for a for evaluation and management of a palpable mass in the right breast at the 9:00 position. Dr. Ruthann Cancer is her gynecologist. Dr. Kreg Shropshire is her PCP.  The patient has no prior history of breast disease. Her last Mammogram was 12 years ago. Stay she states that she felt a lump in the lateral right breast 2 months ago. Occasional pain. No discharge. Imaging studies showed a 6 cm Well circumscribed irregular mass in the upper-outer quadrant of the right breast. The radiologist describes a palpable mass in the right breast at the 9 clock position. Ultrasound of the right axilla is normal. Imaging studies the left breast showed a benign lymph node at the 3:00 position of the left breast. Image guided biopsy of the right breast mass shows atypical papillary lesion. Atypical ductal hyperplasia could not be ruled out. Breast cancer could not be ruled out.  All my exam there is an obvious, a lemon sized palpable mass at the 9:00 position the right breast. Quite mobile. No fixation to skin or chest wall. Past history significant for open appendectomy, tubal ligation, otherwise healthy. Takes no medications.  Family history is negative for breast or ovarian cancer  Operative Findings:       The mass at the 9:00 position the right breast was at the 9 oclock position and was easily palpable. I was able to dissect widely around this and get a good cosmetic reconstruction of the lumpectomy defect. Gross intraoperative  assessment by Dr. Donato Heinz was performed, and he stated that the closest margin was 1 cm, anterior and all the other margins appeared to be greater.  Procedure in Detail:          Following the induction of general LMA anesthesia the patient's entire right breast was prepped and draped in a sterile fashion. The patient had undergone a right pectoral block by Dr. Ola Spurr preoperatively. Surgical time out was performed and intravenous antibiotics were given. I supplemented the pectoral blocked with 0.25% Marcaine with epinephrine injected the entire intended incision and subcutaneous tissue around that. I made a radially oriented incision in the modified position overlying the palpable mass. I dissected down into the breast tissue widely around the palpable mass. The specimen was removed and marked with a silk suture to mark superior, lateral, and deep margins. Specimen was sent to the lab and Dr. Donato Heinz performed an intraoperative assessment and stated there were  widely negative margins as described above. The wound was irrigated with sterile saline. Hemostasis was excellent and achieved with electrocautery. I closed the defect in multiple layers with 2-0 Vicryl sutures and  3-0 Vicryl sutures. The skin was closed with a running subcuticular suture of 4-0 Monocryl and Dermabond.   A clean dressing was placed and the breast binder was placed. The patient tolerated the procedure well and was taken to PACU in stable condition. EBL 15 cc. Counts correct. Complications none.     Edsel Petrin. Dalbert Batman, M.D., FACS General and Minimally Invasive Surgery Breast and Colorectal Surgery  12/16/2013 10:20 AM

## 2013-12-16 NOTE — Interval H&P Note (Signed)
History and Physical Interval Note:  12/16/2013 9:16 AM  [name] [name]  has presented today for surgery, with the diagnosis of right breast mass   The goals and the  various methods of treatment have been discussed with the patient and family. After consideration of risks, benefits and other options for treatment, the patient has consented to  Procedure(s): LUMPECTOMY (Right) as a surgical intervention .  The patient's history has been reviewed, patient examined today , no change in status, stable for surgery.  I have reviewed the patient's chart and labs.  Questions were answered to the patient's satisfaction.     Adin Hector

## 2013-12-16 NOTE — Anesthesia Preprocedure Evaluation (Signed)
Anesthesia Evaluation  Patient identified by MRN, date of birth, ID band Patient awake    Reviewed: Allergy & Precautions, H&P , NPO status , Patient's Chart, lab work & pertinent test results  Airway Mallampati: II TM Distance: >3 FB Neck ROM: Full    Dental no notable dental hx. (+) Upper Dentures, Lower Dentures, Dental Advisory Given   Pulmonary neg pulmonary ROS, former smoker,  breath sounds clear to auscultation  Pulmonary exam normal       Cardiovascular negative cardio ROS  Rhythm:Regular Rate:Normal     Neuro/Psych negative neurological ROS  negative psych ROS   GI/Hepatic negative GI ROS, Neg liver ROS,   Endo/Other  negative endocrine ROS  Renal/GU negative Renal ROS  negative genitourinary   Musculoskeletal   Abdominal   Peds  Hematology negative hematology ROS (+)   Anesthesia Other Findings   Reproductive/Obstetrics negative OB ROS                           Anesthesia Physical Anesthesia Plan  ASA: I  Anesthesia Plan: General and Regional   Post-op Pain Management:    Induction: Intravenous  Airway Management Planned: LMA  Additional Equipment:   Intra-op Plan:   Post-operative Plan: Extubation in OR  Informed Consent: I have reviewed the patients History and Physical, chart, labs and discussed the procedure including the risks, benefits and alternatives for the proposed anesthesia with the patient or authorized representative who has indicated his/her understanding and acceptance.   Dental advisory given  Plan Discussed with: CRNA  Anesthesia Plan Comments:         Anesthesia Quick Evaluation

## 2013-12-16 NOTE — Discharge Instructions (Signed)
[place] Office Phone Number [phone]  BREAST BIOPSY/ LUMPECTOMY: POST OP INSTRUCTIONS  Always review your discharge instruction sheet given to you by the facility where your surgery was performed.    1. A prescription for pain medication may be given to you upon discharge.  Take your pain medication as prescribed, if needed.  If narcotic pain medicine is not needed, then you may take acetaminophen (Tylenol) or ibuprofen (Advil) as needed. 2. Take your usually prescribed medications unless otherwise directed 3. If you need a refill on your pain medication, please contact your pharmacy.  They will contact our office to request authorization.  Prescriptions will not be filled after 5pm or on week-ends. 4. You should eat very light the first 24 hours after surgery, such as soup, crackers, pudding, etc.  Resume your normal diet the day after surgery. 5. Most patients will experience some swelling and bruising in the breast.  Ice packs and a good support bra will help.  Swelling and bruising can take several days to resolve.  6. It is common to experience some constipation if taking pain medication after surgery.  Increasing fluid intake and taking a stool softener will usually help or prevent this problem from occurring.  A mild laxative (Milk of Magnesia or Miralax) should be taken according to package directions if there are no bowel movements after 48 hours. 7. Unless discharge instructions indicate otherwise, you may remove your bandages 24-48 hours after surgery, and you may shower at that time.  You may have steri-strips (small skin tapes) in place directly over the incision.  These strips should be left on the skin for 7-10 days.  If your surgeon used skin glue on the incision, you may shower in 24 hours.  The glue will flake off over the next 2-3 weeks.  Any sutures or staples will be removed at the office during your follow-up visit. 8. ACTIVITIES:  You may resume regular  daily activities (gradually increasing) beginning the next day.  Wearing a good support bra or sports bra minimizes pain and swelling.  You may have sexual intercourse when it is comfortable. a. You may drive when you no longer are taking prescription pain medication, you can comfortably wear a seatbelt, and you can safely maneuver your car and apply brakes. b. RETURN TO WORK:  _________________________see our doctor in the office for a follow-up appointment approximately two weeks after your surgery.  Your doctors nurse will typically make your follow-up appointment when she calls you with your pathology report.  Expect your pathology report 2-3 business days after your surgery.  You may call to check if you do not hear from [place] after three days.  WHEN TO CALL YOUR DOCTOR: 1. Fever over 101.0 2. Nausea and/or vomiting. 3. Extreme swelling or bruising. 4. Continued bleeding from incision. 5. Increased pain, redness, or drainage from the incision.  The clinic staff is available to answer your questions during regular business hours.  Please dont hesitate to call and ask to speak to one of the nurses for clinical concerns.  If you have a medical emergency, go to the nearest emergency room or call 911.  A surgeon from [facility] is always on call at the hospital.  For further questions, please visit [email or web address]

## 2013-12-16 NOTE — Progress Notes (Signed)
Assisted Dr. Ola Spurr with right, ultrasound guided, pectoralis block. Side rails up, monitors on throughout procedure. See vital signs in flow sheet. Tolerated Procedure well.

## 2013-12-17 LAB — POCT HEMOGLOBIN-HEMACUE: Hemoglobin: 12.1 g/dL (ref 12.0–15.0)

## 2013-12-18 ENCOUNTER — Encounter (HOSPITAL_BASED_OUTPATIENT_CLINIC_OR_DEPARTMENT_OTHER): Payer: Self-pay | Admitting: General Surgery

## 2013-12-19 ENCOUNTER — Other Ambulatory Visit (INDEPENDENT_AMBULATORY_CARE_PROVIDER_SITE_OTHER): Payer: Self-pay

## 2013-12-19 ENCOUNTER — Telehealth (INDEPENDENT_AMBULATORY_CARE_PROVIDER_SITE_OTHER): Payer: Self-pay | Admitting: General Surgery

## 2013-12-19 DIAGNOSIS — C50919 Malignant neoplasm of unspecified site of unspecified female breast: Secondary | ICD-10-CM

## 2013-12-19 NOTE — Telephone Encounter (Signed)
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 Widely negative margins. Dr. Donato Heinz described this as an indolent breast cancer with a good prognosis but still needs to be staged as an invasive cancer technically.  I called [name] [name] and discussed this with her. I told her that we would need to make further decisions about whether she needed chemotherapy, radiation therapy, and axillary lymph node biopsy. She will be scheduled for MRI. She'll be scheduled for medical oncology consultation She will be posted for presentation at breast conference next week She is scheduled to see me on March 17.  She expressed understanding and appreciation for all this information and is willing to undergo all of these of next steps.   Edsel Petrin. Dalbert Batman, M.D., [facility]

## 2013-12-19 NOTE — Telephone Encounter (Signed)
Ms. is his pathology report shows intracystic papillary carcinoma of the breast. I've discussed this with Dr. Chriss Czar. He says that he we have widely negative margins. He says this is a fairly indolent carcinoma and has an excellent prognosis but will still need to be technically staged as an invasive carcinoma.  I called Ms. [name] [name] and discuss this with her. I told her that we would need to make decisions about additional therapies including axillary lymph node biopsy, radiation therapy, and chemotherapy.  She will be discussed at breast conference next week. She'll be referred to medical oncology female MRI will be scheduled She is scheduled to see me on 12/31/2013.  We discussed this for a while. She seems to understand all these issues. She is willing to undergo all these next aspirin.   Edsel Petrin. Dalbert Batman, M.D., [facility] General and Minimally invasive Surgery Breast and Colorectal Surgery Office:   [phone] Pager:   [phone]

## 2013-12-19 NOTE — Telephone Encounter (Signed)
LMOM for her to call me to discuss Pathology report.

## 2013-12-20 ENCOUNTER — Telehealth (INDEPENDENT_AMBULATORY_CARE_PROVIDER_SITE_OTHER): Payer: Self-pay | Admitting: *Deleted

## 2013-12-20 ENCOUNTER — Telehealth (INDEPENDENT_AMBULATORY_CARE_PROVIDER_SITE_OTHER): Payer: Self-pay

## 2013-12-20 NOTE — Telephone Encounter (Signed)
Pt notified and states she understands. 

## 2013-12-20 NOTE — Telephone Encounter (Signed)
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 Pt rtw date was 12-23-13. Breast lumpectomy was 12-16-13.  Pt states they are 10-15 lbs each and she may lift them all day long. Pt wants to know if it will be safe for her to return to work on 12-23-13 with this much lifting. I advised pt this question will be sent to Dr Dalbert Batman for review. Pt advised we will call her back and do a work note per Dr Darrel Hoover recommendation.

## 2013-12-20 NOTE — Telephone Encounter (Signed)
She may lift 10-15 pounds as much as she would like. No restrictions there. The biggest thing to avoid is being hit in the lumpectomy incision. Also do not want her to carry heavy objects up against her chest where would cause pressure on the breast.   hmi

## 2013-12-20 NOTE — Telephone Encounter (Signed)
I spoke with pt and informed her of the appt for her MRI at [id number] on 12/24/13 with an arrival time of 8:45am.  She is agreeable with appt information provided.

## 2013-12-23 ENCOUNTER — Encounter (INDEPENDENT_AMBULATORY_CARE_PROVIDER_SITE_OTHER): Payer: Self-pay | Admitting: General Surgery

## 2013-12-24 ENCOUNTER — Ambulatory Visit
Admission: RE | Admit: 2013-12-24 | Discharge: 2013-12-24 | Disposition: A | Discharge status: Home / Self Care | Payer: BC Managed Care – PPO | Source: Ambulatory Visit | Attending: General Surgery | Admitting: General Surgery

## 2013-12-24 ENCOUNTER — Telehealth (INDEPENDENT_AMBULATORY_CARE_PROVIDER_SITE_OTHER): Payer: Self-pay

## 2013-12-24 ENCOUNTER — Encounter (INDEPENDENT_AMBULATORY_CARE_PROVIDER_SITE_OTHER): Payer: Self-pay

## 2013-12-24 DIAGNOSIS — C50919 Malignant neoplasm of unspecified site of unspecified female breast: Secondary | ICD-10-CM

## 2013-12-24 MED ORDER — GADOBENATE DIMEGLUMINE 529 MG/ML IV SOLN
15.0000 mL | Freq: Once | INTRAVENOUS | Status: AC | PRN
  Administered 2013-12-24: 15 mL via INTRAVENOUS

## 2013-12-24 NOTE — Telephone Encounter (Signed)
Patient states she went back to soon , She is asking to be out of work  until 12-30-13 please advise; Please fax note to [phone]

## 2013-12-24 NOTE — Telephone Encounter (Signed)
3/16 is OK.  hmi

## 2013-12-27 ENCOUNTER — Encounter: Payer: Self-pay | Admitting: *Deleted

## 2013-12-27 NOTE — Progress Notes (Signed)
Received referral in EPIC - gave paperwork to Dr. Humphrey Rolls for an appt date and time and emailed Deanna at [facility] to make her aware.

## 2013-12-30 ENCOUNTER — Telehealth (INDEPENDENT_AMBULATORY_CARE_PROVIDER_SITE_OTHER): Payer: Self-pay | Admitting: General Surgery

## 2013-12-30 NOTE — Telephone Encounter (Signed)
I called the patient's home number this evening to discuss her MRI report. I left a message on the machine and requested that she call me in the office tomorrow.   Adin Hector 6:29 PM

## 2013-12-31 ENCOUNTER — Encounter (INDEPENDENT_AMBULATORY_CARE_PROVIDER_SITE_OTHER): Payer: Self-pay

## 2013-12-31 ENCOUNTER — Ambulatory Visit (INDEPENDENT_AMBULATORY_CARE_PROVIDER_SITE_OTHER): Payer: BC Managed Care – PPO | Admitting: General Surgery

## 2013-12-31 ENCOUNTER — Telehealth (INDEPENDENT_AMBULATORY_CARE_PROVIDER_SITE_OTHER): Payer: Self-pay

## 2013-12-31 ENCOUNTER — Telehealth: Payer: Self-pay | Admitting: *Deleted

## 2013-12-31 ENCOUNTER — Encounter (INDEPENDENT_AMBULATORY_CARE_PROVIDER_SITE_OTHER): Payer: Self-pay | Admitting: General Surgery

## 2013-12-31 VITALS — BP 128/74 | HR 75 | Temp 97.8°F | Resp 16 | Ht 64.0 in | Wt 166.0 lb

## 2013-12-31 DIAGNOSIS — C50311 Malignant neoplasm of lower-inner quadrant of right female breast: Secondary | ICD-10-CM | POA: Insufficient documentation

## 2013-12-31 DIAGNOSIS — C50319 Malignant neoplasm of lower-inner quadrant of unspecified female breast: Secondary | ICD-10-CM

## 2013-12-31 DIAGNOSIS — Z170 Estrogen receptor positive status [ER+]: Secondary | ICD-10-CM | POA: Insufficient documentation

## 2013-12-31 NOTE — Patient Instructions (Signed)
Your recent right breast lumpectomy reveals a large, but very slow growing  breast cancer called intracystic papillary carcinoma. The tumor has been completely removed from your breast with clean margins.  Your MRI shows no other area of abnormality.  You will be scheduled for a right axillary sentinel lymph node biopsy, as we discussed. You have also been referred to a medical oncologist to talk about whether you will need radiation therapy or other forms of treatment such as antiestrogen pill treatment.  Sentinel Lymph Node Biopsy Sentinel lymph node biopsy is a procedure in which a single lymph node is identified, removed, and examined for cancer. Lymph nodes are collections of tissue that help filter infections, cancer cells, and other waste substances from the bloodstream. Certain types of cancer can spread to nearby lymph nodes. The cancer spreads to one lymph node first, and then to others. The first lymph node that your cancer could spread to is called the sentinel lymph node. Examining the sentinel lymph node for cancer can help your caregiver plan future treatment for you. LET YOUR CAREGIVER KNOW ABOUT:   Allergies to food or medicine.  Medicines taken, including vitamins, herbs, eyedrops, over-the-counter medicines, and creams.  Use of steroids (by mouth or creams).  Previous problems with numbing medicines.  History of bleeding problems or blood clots.  Previous surgery.  Other health problems, including diabetes and kidney problems.  Possibility of pregnancy, if this applies. RISKS AND COMPLICATIONS   Infection.  Bleeding.  Allergic reaction to the dye used for the procedure.  Blue staining of the skin where the dye is injected.  Damaged lymph vessels, causing a buildup of fluid (lymphedema).  Pain or bruising at the biopsy site. BEFORE THE PROCEDURE   Stop smoking at least 2 weeks before the procedure. Not smoking will improve your health after the procedure and  decrease the chance of getting a wound infection.  You may have blood tests to make sure your blood clots normally.  Ask your caregiver about changing or stopping your regular medicines.  Do not eat or drink anything for 8 hours before the procedure. PROCEDURE   You will be given medicine that makes you sleep (general anesthetic).  A blue, radioactive dye will be injected near the tumor. The dye will then spread into the sentinel lymph node.  A scanner will identify the sentinel lymph node.  A small cut (incision) will be made, and the sentinel lymph node will be removed.  The sentinel lymph node will be examined in a lab. Sometimes, a sentinel lymph node biopsy is performed during another surgery, such as a mastectomy or lumpectomy for breast cancer.  AFTER THE PROCEDURE   You will go to a recovery room.  You will be monitored for several hours.  If complications do not occur, you will be allowed to go home a few hours after the procedure.  Your urine may be blue for the next 24 hours. This is normal. It is caused by the dye used during the procedure.  Your skin where the dye was injected may be blue for up to 8 weeks. Document Released: 12/26/2011 Document Reviewed: 12/26/2011 [facility] Patient Information 2014 [place].

## 2013-12-31 NOTE — Telephone Encounter (Signed)
Received appt date and time from Dr. Humphrey Rolls & I called and left a message for the pt to return my call so I can schedule a med onc appt.

## 2013-12-31 NOTE — Telephone Encounter (Signed)
Pt needs to be informed of the following:  Please call patient Tuesday morning. Tell her that her breast MRI shows only lumpectomy changes in the outer right breast but there is no MRI evidence of malignancy elsewhere. However the lymph nodes looked normal. Tell her this is good news.   Thanks

## 2013-12-31 NOTE — Progress Notes (Signed)
Patient ID: [name] [name], female   DOB: 1951-03-19, 63 y.o.   MRN: [id number] History: This patient recently presented with a very large palpable mass at the 9:00 position of the right breast. Image guided biopsy showed atypical papillary lesion. Family history is negative Right partial mastectomy was performed by me on 12/16/2013. She is recovering uneventfully from that surgery. The final pathology report shows papillary carcinoma, intracystic type, solid variant. 5.2 cm.  Widely negative margins. ER-positive 100%, PR-positive 100%, Ki-67 8%, HER-2 negative. MRI of both breasts reveal no other abnormal areas other than the biopsy site. Review of NIH guidelines revealed that this is a low-grade tumor with excellent long-term prognosis. Sentinel lymph node biopsy is felt to be prudent. Antiestrogen therapy is felt to be indicated. Radiation therapy is recommended on a case by case basis. I've discussed this with the patient in detail. She is scheduled to see one of the medical oncologist in the near future.  Past history, family history, social history, and review of systems are documented on the chart, unchanged, and noncontributory except as described above  Exam: Patient is alert and in no distress Neck without adenopathy or mass Lungs clear to auscultation bilaterally Heart regular rate and rhythm. No murmur. No ectopy   breasts:  right breast is examined. It is large. Linear incision in the lower outer quadrant of the right breast is healing nicely. The tissues are soft. Cosmesis is excellent. No sign of infection or hematoma.  Assessment: Invasive cancer right breast, lower outer quadrant, 5.2 cm, low-grade papillary carcinoma, intracystic type. Stage T3, Nx. History of open appendectomy History tubal ligation Family history negative for breast or ovarian cancer  Plan: Scheduled for a right axillary sentinel lymph node biopsy Referral to medical oncology and radiation oncology I  discussed the indications, details, techniques, and numerous risk of the surgery with her. She is aware of the risk of bleeding, infection, nerve damage and numbness or pain, or swelling, and other unforeseen problems. All of her questions are answered. She understands these issues well. She agrees with this plan.    Edsel Petrin. Dalbert Batman, M.D., [facility] General and Minimally invasive Surgery Breast and Colorectal Surgery Office:   [phone] Pager:   [phone]

## 2014-01-02 ENCOUNTER — Telehealth: Payer: Self-pay | Admitting: *Deleted

## 2014-01-02 NOTE — Telephone Encounter (Signed)
Left message for pt to return my call so I can schedule a Med Onc appt. 

## 2014-01-03 ENCOUNTER — Telehealth: Payer: Self-pay | Admitting: *Deleted

## 2014-01-03 NOTE — Telephone Encounter (Signed)
Received appt date and time from Dr. Humphrey Rolls.  Called and confirmed 01/28/14 appt w/ pt.  Mailed before appt letter, welcome packet & intake form to pt.  Emailed Deanna at [facility] to make her aware.  Took paperwork to Med Rec for chart.

## 2014-01-20 ENCOUNTER — Other Ambulatory Visit: Payer: Self-pay | Admitting: *Deleted

## 2014-01-20 DIAGNOSIS — C50319 Malignant neoplasm of lower-inner quadrant of unspecified female breast: Secondary | ICD-10-CM

## 2014-01-20 NOTE — Progress Notes (Signed)
Lab orders put in EPIC.  Chart placed in Dr. Debbe Odea for review.

## 2014-01-22 ENCOUNTER — Other Ambulatory Visit (INDEPENDENT_AMBULATORY_CARE_PROVIDER_SITE_OTHER): Payer: Self-pay

## 2014-01-22 DIAGNOSIS — C50919 Malignant neoplasm of unspecified site of unspecified female breast: Secondary | ICD-10-CM

## 2014-01-23 ENCOUNTER — Encounter (HOSPITAL_BASED_OUTPATIENT_CLINIC_OR_DEPARTMENT_OTHER): Payer: Self-pay | Admitting: *Deleted

## 2014-01-23 NOTE — Progress Notes (Signed)
Pt had lumpectomy here 12/16/13-did well-not needs snbx

## 2014-01-27 NOTE — H&P (Signed)
  History and Physical    History:  This patient recently presented with a very large palpable mass at the 9:00 position of the right breast. Image guided biopsy showed atypical papillary lesion.  Family history is negative  Right partial mastectomy was performed by me on 12/16/2013. She is recovering uneventfully from that surgery.  The final pathology report shows papillary carcinoma, intracystic type, solid variant. 5.2 cm. Widely negative margins. ER-positive 100%, PR-positive 100%, Ki-67 8%, HER-2 negative.  MRI of both breasts reveal no other abnormal areas other than the biopsy site.  Review of NIH guidelines revealed that this is a low-grade tumor with excellent long-term prognosis. Sentinel lymph node biopsy is felt to be prudent. Antiestrogen therapy is felt to be indicated. Radiation therapy is recommended on a case by case basis.  I've discussed this with the patient in detail.  She is scheduled to see one of the medical oncologists in the near future.   Past history, family history, social history, and review of systems are documented on the chart, unchanged, and noncontributory except as described above   Exam:  Patient is alert and in no distress  Neck without adenopathy or mass  Lungs clear to auscultation bilaterally  Heart regular rate and rhythm. No murmur. No ectopy  breasts: right breast is examined. It is large. Linear incision in the lower outer quadrant of the right breast is healing nicely. The tissues are soft. Cosmesis is excellent. No sign of infection or hematoma.   Assessment:  Invasive cancer right breast, lower outer quadrant, 5.2 cm, low-grade papillary carcinoma, intracystic type. Stage T3, Nx.  History of open appendectomy  History tubal ligation  Family history negative for breast or ovarian cancer   Plan:  Scheduled for a right axillary sentinel lymph node biopsy  Referral to medical oncology and radiation oncology  I discussed the indications,  details, techniques, and numerous risk of the surgery with her. She is aware of the risk of bleeding, infection, nerve damage and numbness or pain, or swelling, and other unforeseen problems. All of her questions are answered. She understands these issues well. She agrees with this plan.     Edsel Petrin. Dalbert Batman, M.D., [facility]  General and Minimally invasive Surgery  Breast and Colorectal Surgery  Office: [phone]  Pager: [phone]

## 2014-01-28 ENCOUNTER — Encounter: Payer: Self-pay | Admitting: Oncology

## 2014-01-28 ENCOUNTER — Other Ambulatory Visit (HOSPITAL_BASED_OUTPATIENT_CLINIC_OR_DEPARTMENT_OTHER): Payer: BC Managed Care – PPO

## 2014-01-28 ENCOUNTER — Encounter: Payer: Self-pay | Admitting: *Deleted

## 2014-01-28 ENCOUNTER — Ambulatory Visit (HOSPITAL_BASED_OUTPATIENT_CLINIC_OR_DEPARTMENT_OTHER): Payer: BC Managed Care – PPO | Admitting: Oncology

## 2014-01-28 ENCOUNTER — Ambulatory Visit: Payer: BC Managed Care – PPO

## 2014-01-28 VITALS — BP 133/79 | HR 76 | Temp 97.1°F | Resp 18 | Ht 64.0 in | Wt 163.2 lb

## 2014-01-28 DIAGNOSIS — C50319 Malignant neoplasm of lower-inner quadrant of unspecified female breast: Secondary | ICD-10-CM

## 2014-01-28 DIAGNOSIS — C50311 Malignant neoplasm of lower-inner quadrant of right female breast: Secondary | ICD-10-CM

## 2014-01-28 DIAGNOSIS — C50419 Malignant neoplasm of upper-outer quadrant of unspecified female breast: Secondary | ICD-10-CM

## 2014-01-28 LAB — CBC WITH DIFFERENTIAL/PLATELET
BASO%: 0.4 % (ref 0.0–2.0)
BASOS ABS: 0 10*3/uL (ref 0.0–0.1)
EOS%: 2.4 % (ref 0.0–7.0)
Eosinophils Absolute: 0.1 10*3/uL (ref 0.0–0.5)
HCT: 39.7 % (ref 34.8–46.6)
HEMOGLOBIN: 13 g/dL (ref 11.6–15.9)
LYMPH#: 1.8 10*3/uL (ref 0.9–3.3)
LYMPH%: 33.8 % (ref 14.0–49.7)
MCH: 26.9 pg (ref 25.1–34.0)
MCHC: 32.7 g/dL (ref 31.5–36.0)
MCV: 82 fL (ref 79.5–101.0)
MONO#: 0.4 10*3/uL (ref 0.1–0.9)
MONO%: 6.7 % (ref 0.0–14.0)
NEUT#: 3.1 10*3/uL (ref 1.5–6.5)
NEUT%: 56.7 % (ref 38.4–76.8)
Platelets: 177 10*3/uL (ref 145–400)
RBC: 4.84 10*6/uL (ref 3.70–5.45)
RDW: 13.4 % (ref 11.2–14.5)
WBC: 5.4 10*3/uL (ref 3.9–10.3)

## 2014-01-28 LAB — COMPREHENSIVE METABOLIC PANEL (CC13)
ALT: 10 U/L (ref 0–55)
AST: 18 U/L (ref 5–34)
Albumin: 4.4 g/dL (ref 3.5–5.0)
Alkaline Phosphatase: 101 U/L (ref 40–150)
Anion Gap: 11 mEq/L (ref 3–11)
BUN: 17.4 mg/dL (ref 7.0–26.0)
CALCIUM: 10.1 mg/dL (ref 8.4–10.4)
CO2: 24 mEq/L (ref 22–29)
Chloride: 108 mEq/L (ref 98–109)
Creatinine: 1 mg/dL (ref 0.6–1.1)
Glucose: 91 mg/dl (ref 70–140)
POTASSIUM: 4.2 meq/L (ref 3.5–5.1)
Sodium: 143 mEq/L (ref 136–145)
Total Bilirubin: 0.38 mg/dL (ref 0.20–1.20)
Total Protein: 7.8 g/dL (ref 6.4–8.3)

## 2014-01-28 NOTE — Progress Notes (Signed)
Checked in new patient with no financial issues. She has not been out of [place]. °

## 2014-01-28 NOTE — [facility] Oncology Navigator Note (Signed)
Met with pt to give Journey bad, binder and navigation resources.  Informed pt we will be sending an oncotype on her specimen once we have the results of her SLN bx.  Received verbal understanding.  Pt denies further needs at this time.

## 2014-01-28 NOTE — Progress Notes (Signed)
[name] [name] [id number] [birth year] 63 y.o. 01/28/2014 3:46 PM  CC  Hollis,Lachina M, FNP [address] [id number] Dr. Fanny Skates  REASON FOR CONSULTATION:  63 year old female with new diagnosis of papillary carcinoma of the right breast.  STAGE:     REFERRING PHYSICIAN: Dr. Fanny Skates  HISTORY OF PRESENT ILLNESS:  [name] [name] is a 63 y.o. female.  Has not had a mammogram in about 12 years. Approximately in December patient felt a mass in the lateral right breast. She does tell me that she has occasionally had some pain. She had mammogram performed that showed a 6 cm irregular mass in the upper outer quadrant of the right breast. Ultrasound of the right axilla was normal. The left breast showed a benign lymph node at the 3:00 position. Patient underwent a biopsy of the right breast mass that showed atypical papillary lesion. Because of this she was seen by Dr. Fanny Skates. He recommended a lumpectomy and removal of this lesion. Patient went on to have a lumpectomy performed on 11/27/2013. 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 Margins were negative. Tumor was ER +100% PR +100% HER-2/neu negative with a proliferation marker Ki-67 8%. She went on to have MRI of the breasts performed that were negative. Patient's case was discussed at the multidisciplinary breast conference. She was recommended further workup including doing sentinel lymph node. Patient is seen in medical oncology for discussion of treatment options adjuvantly. She is without any complaints  Past Medical History: Past Medical History  Diagnosis Date  . Arthritis     right knee  . Wears glasses   . Full dentures     Past Surgical History: Past Surgical History  Procedure Laterality Date  . Appendectomy    . Tubal ligation    . Breast lumpectomy Right 12/16/2013    Procedure: LUMPECTOMY;  Surgeon: Adin Hector, MD;  Location:  [facility] [facility];  Service: General;  Laterality: Right;    Family History: Family History  Problem Relation Age of Onset  . Hypertension Sister   . Hypertension Brother     Social History History  Substance Use Topics  . Smoking status: Former Smoker    Quit date: 12/10/1990  . Smokeless tobacco: Not on file  . Alcohol Use: No    Allergies: Allergies  Allergen Reactions  . Codeine Nausea And Vomiting    Current Medications: Current Outpatient Prescriptions  Medication Sig Dispense Refill  . celecoxib (CELEBREX) 200 MG capsule Take 200 mg by mouth as needed.      Marland Kitchen HYDROcodone-acetaminophen (NORCO/VICODIN) 5-325 MG per tablet Take 1-2 tablets by mouth every 4 (four) hours as needed.  30 tablet  0  . naproxen sodium (ANAPROX) 220 MG tablet Take 220 mg by mouth 2 (two) times daily with a meal. For pain.       No current facility-administered medications for this visit.    OB/GYN History: menarche at 47, menopause at 68, no HRT, first live birth at 19, GxP62  Fertility Discussion: n/a Prior History of Cancer: no  Health Maintenance:  Colonoscopy yes 01/2014 Bone Density none Last PAP smear march 2015  ECOG PERFORMANCE STATUS: 0 - Asymptomatic  Genetic Counseling/testing: N/A  REVIEW OF SYSTEMS:  A comprehensive review of systems was negative.  PHYSICAL EXAMINATION: Blood pressure 133/79, pulse 76, temperature 97.1 F (36.2 C), temperature source Oral, resp. rate 18, height _0  (1.626 m), weight 163 lb  3.2 oz (74.027 kg).  General:  well-nourished in no acute distress.  Eyes:  no scleral icterus.  ENT:  There were no oropharyngeal lesions.  Neck was without thyromegaly.  Lymphatics:  Negative cervical, supraclavicular or axillary adenopathy.  Respiratory: lungs were clear bilaterally without wheezing or crackles.  Cardiovascular:  Regular rate and rhythm, S1/S2, without murmur, rub or gallop.  There was no pedal edema.  GI:  abdomen was soft, flat,  nontender, nondistended, without organomegaly.  Muscoloskeletal:  no spinal tenderness of palpation of vertebral spine.  Skin exam was without echymosis, petichae.  Neuro exam was nonfocal.  Patient was able to get on and off exam table without assistance.  Gait was normal.  Patient was alerted and oriented.  Attention was good.   Language was appropriate.  Mood was normal without depression.  Speech was not pressured.  Thought content was not tangential.   Breasts: right breast normal without mass, skin or nipple changes or axillary nodes healing lumpectomy scar. No other changes, left breast normal without mass, skin or nipple changes or axillary nodes.   STUDIES/RESULTS: No results found.   LABS:    Chemistry      Component Value Date/Time   NA 143 01/28/2014 1443   NA 142 12/11/2013 1400   K 4.2 01/28/2014 1443   K 4.6 12/11/2013 1400   CL 103 12/11/2013 1400   CO2 24 01/28/2014 1443   CO2 27 12/11/2013 1400   BUN 17.4 01/28/2014 1443   BUN 23 12/11/2013 1400   CREATININE 1.0 01/28/2014 1443   CREATININE 0.93 12/11/2013 1400      Component Value Date/Time   CALCIUM 10.1 01/28/2014 1443   CALCIUM 9.8 12/11/2013 1400   ALKPHOS 101 01/28/2014 1443   ALKPHOS 94 12/11/2013 1400   AST 18 01/28/2014 1443   AST 20 12/11/2013 1400   ALT 10 01/28/2014 1443   ALT 15 12/11/2013 1400   BILITOT 0.38 01/28/2014 1443   BILITOT 0.4 12/11/2013 1400      Lab Results  Component Value Date   WBC 5.4 01/28/2014   HGB 13.0 01/28/2014   HCT 39.7 01/28/2014   MCV 82.0 01/28/2014   PLT 177 01/28/2014       ASSESSMENT/PLAN   63 year old female with  #1 presentation with a right palpable mass at the 9:00 position. Eventual workup including a right partial mastectomy/lumpectomy performed on 12/16/2013 revealed 5.2 cm papillary carcinoma, intracystic type, solid variant. Tumor was ER positive PR positive HER-2/neu negative with a low proliferation marker Ki-67. Postoperatively she is doing well. Patient overall  has a good prognosis. However question arises whether or not she needs further adjuvant therapy such as chemotherapy versus just antiestrogen therapy. According to guidelines this tumor has an excellent prognosis and she may do very well with antiestrogen therapy alone. However I did discuss doing an Oncotype DX testing on the patient. I would also like to await the results of her sentinel node biopsy that will be performed on 01/30/2014. We discussed rationale for Oncotype testing.  #2 I would also like to have the patient be seen by radiation oncology for further evaluation and possible consideration of adjuvant radiation therapy.  #3 I will plan on seeing the patient back in 3-4 weeks time in followup after her Oncotype results are back to   Thank you so much for allowing me to participate in the care of [name] [name]. I will continue to follow up the patient with you and assist in her  care.  All questions were answered. The patient knows to call the clinic with any problems, questions or concerns. We can certainly see the patient much sooner if necessary.  I spent 40 minutes counseling the patient face to face. The total time spent in the appointment was 45 minutes.  Marcy Panning, MD Medical/Oncology [facility] [phone] (beeper) [phone] (Office)  01/28/2014, 3:46 PM

## 2014-01-29 ENCOUNTER — Telehealth: Payer: Self-pay

## 2014-01-29 ENCOUNTER — Encounter (HOSPITAL_BASED_OUTPATIENT_CLINIC_OR_DEPARTMENT_OTHER)
Admission: RE | Disposition: A | Discharge status: Home / Self Care | Payer: Self-pay | Source: Ambulatory Visit | Attending: General Surgery

## 2014-01-29 ENCOUNTER — Ambulatory Visit (HOSPITAL_BASED_OUTPATIENT_CLINIC_OR_DEPARTMENT_OTHER): Payer: BC Managed Care – PPO | Admitting: Anesthesiology

## 2014-01-29 ENCOUNTER — Encounter (HOSPITAL_BASED_OUTPATIENT_CLINIC_OR_DEPARTMENT_OTHER): Payer: Self-pay | Admitting: Anesthesiology

## 2014-01-29 ENCOUNTER — Encounter (HOSPITAL_BASED_OUTPATIENT_CLINIC_OR_DEPARTMENT_OTHER): Payer: BC Managed Care – PPO | Admitting: Anesthesiology

## 2014-01-29 ENCOUNTER — Other Ambulatory Visit (INDEPENDENT_AMBULATORY_CARE_PROVIDER_SITE_OTHER): Payer: Self-pay | Admitting: General Surgery

## 2014-01-29 ENCOUNTER — Ambulatory Visit (HOSPITAL_BASED_OUTPATIENT_CLINIC_OR_DEPARTMENT_OTHER)
Admission: RE | Admit: 2014-01-29 | Discharge: 2014-01-29 | Disposition: A | Discharge status: Home / Self Care | Payer: BC Managed Care – PPO | Source: Ambulatory Visit | Attending: General Surgery | Admitting: General Surgery

## 2014-01-29 ENCOUNTER — Encounter (HOSPITAL_COMMUNITY)
Admission: RE | Admit: 2014-01-29 | Discharge: 2014-01-29 | Disposition: A | Discharge status: Home / Self Care | Payer: BC Managed Care – PPO | Source: Ambulatory Visit | Attending: General Surgery | Admitting: General Surgery

## 2014-01-29 ENCOUNTER — Telehealth: Payer: Self-pay | Admitting: Oncology

## 2014-01-29 DIAGNOSIS — C50311 Malignant neoplasm of lower-inner quadrant of right female breast: Secondary | ICD-10-CM | POA: Diagnosis present

## 2014-01-29 DIAGNOSIS — Z87891 Personal history of nicotine dependence: Secondary | ICD-10-CM | POA: Insufficient documentation

## 2014-01-29 DIAGNOSIS — C50919 Malignant neoplasm of unspecified site of unspecified female breast: Secondary | ICD-10-CM

## 2014-01-29 DIAGNOSIS — Z170 Estrogen receptor positive status [ER+]: Secondary | ICD-10-CM | POA: Diagnosis present

## 2014-01-29 DIAGNOSIS — C50519 Malignant neoplasm of lower-outer quadrant of unspecified female breast: Secondary | ICD-10-CM | POA: Insufficient documentation

## 2014-01-29 DIAGNOSIS — C50319 Malignant neoplasm of lower-inner quadrant of unspecified female breast: Secondary | ICD-10-CM | POA: Insufficient documentation

## 2014-01-29 HISTORY — PX: AXILLARY SENTINEL NODE BIOPSY: SHX5738

## 2014-01-29 SURGERY — BIOPSY, LYMPH NODE, SENTINEL, AXILLARY
Anesthesia: General | Site: Breast | Laterality: Right

## 2014-01-29 MED ORDER — HYDROCODONE-ACETAMINOPHEN 5-325 MG PO TABS: 1.0000 | ORAL_TABLET | Freq: Four times a day (QID) | ORAL | Status: DC | PRN

## 2014-01-29 MED ORDER — SODIUM CHLORIDE 0.9 % IJ SOLN: 3.0000 mL | INTRAMUSCULAR | Status: DC | PRN

## 2014-01-29 MED ORDER — OXYCODONE HCL 5 MG PO TABS: 5.0000 mg | ORAL_TABLET | ORAL | Status: DC | PRN

## 2014-01-29 MED ORDER — ONDANSETRON HCL 4 MG/2ML IJ SOLN
INTRAMUSCULAR | Status: DC | PRN
  Administered 2014-01-29: 4 mg via INTRAVENOUS

## 2014-01-29 MED ORDER — DEXAMETHASONE SODIUM PHOSPHATE 4 MG/ML IJ SOLN
INTRAMUSCULAR | Status: DC | PRN
  Administered 2014-01-29: 10 mg via INTRAVENOUS

## 2014-01-29 MED ORDER — FENTANYL CITRATE 0.05 MG/ML IJ SOLN
INTRAMUSCULAR | Status: AC
  Filled 2014-01-29: qty 4

## 2014-01-29 MED ORDER — OXYCODONE HCL 5 MG PO TABS
ORAL_TABLET | ORAL | Status: AC
  Filled 2014-01-29: qty 1

## 2014-01-29 MED ORDER — BUPIVACAINE-EPINEPHRINE 0.5% -1:200000 IJ SOLN
INTRAMUSCULAR | Status: DC | PRN
  Administered 2014-01-29: 8 mL

## 2014-01-29 MED ORDER — MIDAZOLAM HCL 2 MG/2ML IJ SOLN
1.0000 mg | INTRAMUSCULAR | Status: DC | PRN
  Administered 2014-01-29: 2 mg via INTRAVENOUS

## 2014-01-29 MED ORDER — CHLORHEXIDINE GLUCONATE 4 % EX LIQD: 1.0000 "application " | Freq: Once | CUTANEOUS | Status: DC

## 2014-01-29 MED ORDER — MIDAZOLAM HCL 2 MG/2ML IJ SOLN
INTRAMUSCULAR | Status: AC
  Filled 2014-01-29: qty 2

## 2014-01-29 MED ORDER — OXYCODONE HCL 5 MG/5ML PO SOLN: 5.0000 mg | Freq: Once | ORAL | Status: AC | PRN

## 2014-01-29 MED ORDER — SODIUM CHLORIDE 0.9 % IV SOLN: INTRAVENOUS | Status: DC

## 2014-01-29 MED ORDER — FENTANYL CITRATE 0.05 MG/ML IJ SOLN
INTRAMUSCULAR | Status: AC
  Filled 2014-01-29: qty 2

## 2014-01-29 MED ORDER — FENTANYL CITRATE 0.05 MG/ML IJ SOLN
INTRAMUSCULAR | Status: DC | PRN
Start: 2014-01-29 — End: 2014-01-29
  Administered 2014-01-29: 25 ug via INTRAVENOUS

## 2014-01-29 MED ORDER — LIDOCAINE HCL (CARDIAC) 20 MG/ML IV SOLN
INTRAVENOUS | Status: DC | PRN
  Administered 2014-01-29: 50 mg via INTRAVENOUS

## 2014-01-29 MED ORDER — ACETAMINOPHEN 325 MG PO TABS: 650.0000 mg | ORAL_TABLET | ORAL | Status: DC | PRN

## 2014-01-29 MED ORDER — METHYLENE BLUE 1 % INJ SOLN
INTRAMUSCULAR | Status: DC | PRN
Start: 2014-01-29 — End: 2014-01-29
  Administered 2014-01-29: 2 mL via SUBMUCOSAL

## 2014-01-29 MED ORDER — FENTANYL CITRATE 0.05 MG/ML IJ SOLN
50.0000 ug | INTRAMUSCULAR | Status: DC | PRN
  Administered 2014-01-29: 100 ug via INTRAVENOUS

## 2014-01-29 MED ORDER — FENTANYL CITRATE 0.05 MG/ML IJ SOLN: 25.0000 ug | INTRAMUSCULAR | Status: DC | PRN

## 2014-01-29 MED ORDER — PROPOFOL 10 MG/ML IV BOLUS
INTRAVENOUS | Status: DC | PRN
  Administered 2014-01-29: 150 mg via INTRAVENOUS

## 2014-01-29 MED ORDER — HYDROMORPHONE HCL PF 1 MG/ML IJ SOLN
0.2500 mg | INTRAMUSCULAR | Status: DC | PRN
  Administered 2014-01-29: 0.25 mg via INTRAVENOUS
  Administered 2014-01-29: 0.5 mg via INTRAVENOUS

## 2014-01-29 MED ORDER — SODIUM CHLORIDE 0.9 % IV SOLN: 250.0000 mL | INTRAVENOUS | Status: DC | PRN

## 2014-01-29 MED ORDER — ACETAMINOPHEN 650 MG RE SUPP: 650.0000 mg | RECTAL | Status: DC | PRN

## 2014-01-29 MED ORDER — EPHEDRINE SULFATE 50 MG/ML IJ SOLN
INTRAMUSCULAR | Status: DC | PRN
  Administered 2014-01-29: 10 mg via INTRAVENOUS

## 2014-01-29 MED ORDER — SODIUM CHLORIDE 0.9 % IJ SOLN
INTRAMUSCULAR | Status: DC | PRN
  Administered 2014-01-29: 3 mL via INTRAVENOUS

## 2014-01-29 MED ORDER — LACTATED RINGERS IV SOLN
INTRAVENOUS | Status: DC
  Administered 2014-01-29 (×2): via INTRAVENOUS

## 2014-01-29 MED ORDER — SODIUM CHLORIDE 0.9 % IJ SOLN
3.0000 mL | Freq: Two times a day (BID) | INTRAMUSCULAR | Status: DC
Start: 2014-01-29 — End: 2014-01-29

## 2014-01-29 MED ORDER — MIDAZOLAM HCL 5 MG/5ML IJ SOLN
INTRAMUSCULAR | Status: DC | PRN
  Administered 2014-01-29 (×2): 0.5 mg via INTRAVENOUS

## 2014-01-29 MED ORDER — BUPIVACAINE-EPINEPHRINE PF 0.5-1:200000 % IJ SOLN
INTRAMUSCULAR | Status: AC
  Filled 2014-01-29: qty 30

## 2014-01-29 MED ORDER — MEPERIDINE HCL 25 MG/ML IJ SOLN: 6.2500 mg | INTRAMUSCULAR | Status: DC | PRN

## 2014-01-29 MED ORDER — CEFAZOLIN SODIUM-DEXTROSE 2-3 GM-% IV SOLR
INTRAVENOUS | Status: AC
  Filled 2014-01-29: qty 50

## 2014-01-29 MED ORDER — SODIUM CHLORIDE 0.9 % IJ SOLN
INTRAMUSCULAR | Status: AC
  Filled 2014-01-29: qty 10

## 2014-01-29 MED ORDER — ONDANSETRON HCL 4 MG/2ML IJ SOLN: 4.0000 mg | Freq: Once | INTRAMUSCULAR | Status: DC | PRN

## 2014-01-29 MED ORDER — CEFAZOLIN SODIUM-DEXTROSE 2-3 GM-% IV SOLR
2.0000 g | INTRAVENOUS | Status: AC
  Administered 2014-01-29: 2 g via INTRAVENOUS

## 2014-01-29 MED ORDER — HYDROMORPHONE HCL PF 1 MG/ML IJ SOLN
INTRAMUSCULAR | Status: AC
  Filled 2014-01-29: qty 1

## 2014-01-29 MED ORDER — TECHNETIUM TC 99M SULFUR COLLOID FILTERED
1.0000 | Freq: Once | INTRAVENOUS | Status: AC | PRN
  Administered 2014-01-29: 1 via INTRADERMAL

## 2014-01-29 MED ORDER — OXYCODONE HCL 5 MG PO TABS
5.0000 mg | ORAL_TABLET | Freq: Once | ORAL | Status: AC | PRN
  Administered 2014-01-29: 5 mg via ORAL

## 2014-01-29 SURGICAL SUPPLY — 67 items
ADH SKN CLS APL DERMABOND .7 (GAUZE/BANDAGES/DRESSINGS) ×1
APL SKNCLS STERI-STRIP NONHPOA (GAUZE/BANDAGES/DRESSINGS)
APPLIER CLIP 11 MED OPEN (CLIP)
APR CLP MED 11 20 MLT OPN (CLIP)
BANDAGE ELASTIC 6 VELCRO ST LF (GAUZE/BANDAGES/DRESSINGS) IMPLANT
BENZOIN TINCTURE PRP APPL 2/3 (GAUZE/BANDAGES/DRESSINGS) IMPLANT
BLADE HEX COATED 2.75 (ELECTRODE) ×2 IMPLANT
BLADE SURG 10 STRL SS (BLADE) ×2 IMPLANT
BLADE SURG 15 STRL LF DISP TIS (BLADE) ×2 IMPLANT
BLADE SURG 15 STRL SS (BLADE) ×2
CANISTER SUCT 1200ML W/VALVE (MISCELLANEOUS) ×2 IMPLANT
CHLORAPREP W/TINT 26ML (MISCELLANEOUS) ×2 IMPLANT
CLIP APPLIE 11 MED OPEN (CLIP) IMPLANT
CLIP TI MEDIUM 6 (CLIP) ×2 IMPLANT
COVER MAYO STAND STRL (DRAPES) ×2 IMPLANT
COVER PROBE W GEL 5X96 (DRAPES) ×2 IMPLANT
COVER TABLE BACK 60X90 (DRAPES) ×2 IMPLANT
DECANTER SPIKE VIAL GLASS SM (MISCELLANEOUS) IMPLANT
DERMABOND ADVANCED (GAUZE/BANDAGES/DRESSINGS) ×1
DERMABOND ADVANCED .7 DNX12 (GAUZE/BANDAGES/DRESSINGS) IMPLANT
DRAIN CHANNEL 19F RND (DRAIN) IMPLANT
DRAIN HEMOVAC 1/8 X 5 (WOUND CARE) IMPLANT
DRAPE LAPAROSCOPIC ABDOMINAL (DRAPES) ×2 IMPLANT
DRAPE UTILITY XL STRL (DRAPES) ×3 IMPLANT
DRSG PAD ABDOMINAL 8X10 ST (GAUZE/BANDAGES/DRESSINGS) IMPLANT
ELECT REM PT RETURN 9FT ADLT (ELECTROSURGICAL) ×2
ELECTRODE REM PT RTRN 9FT ADLT (ELECTROSURGICAL) ×1 IMPLANT
EVACUATOR SILICONE 100CC (DRAIN) IMPLANT
GAUZE SPONGE 4X4 16PLY XRAY LF (GAUZE/BANDAGES/DRESSINGS) IMPLANT
GLOVE BIOGEL PI IND STRL 7.0 (GLOVE) IMPLANT
GLOVE BIOGEL PI INDICATOR 7.0 (GLOVE) ×1
GLOVE ECLIPSE 6.5 STRL STRAW (GLOVE) ×1 IMPLANT
GLOVE EUDERMIC 7 POWDERFREE (GLOVE) ×2 IMPLANT
GOWN STRL REUS W/ TWL LRG LVL3 (GOWN DISPOSABLE) ×1 IMPLANT
GOWN STRL REUS W/ TWL XL LVL3 (GOWN DISPOSABLE) ×1 IMPLANT
GOWN STRL REUS W/TWL LRG LVL3 (GOWN DISPOSABLE) ×2
GOWN STRL REUS W/TWL XL LVL3 (GOWN DISPOSABLE) ×2
NDL HYPO 25X1 1.5 SAFETY (NEEDLE) ×2 IMPLANT
NDL SAFETY ECLIPSE 18X1.5 (NEEDLE) ×1 IMPLANT
NEEDLE HYPO 18GX1.5 SHARP (NEEDLE) ×2
NEEDLE HYPO 25X1 1.5 SAFETY (NEEDLE) ×4 IMPLANT
NS IRRIG 1000ML POUR BTL (IV SOLUTION) ×2 IMPLANT
PACK BASIN DAY SURGERY FS (CUSTOM PROCEDURE TRAY) ×2 IMPLANT
PAD ALCOHOL SWAB (MISCELLANEOUS) ×1 IMPLANT
PENCIL BUTTON HOLSTER BLD 10FT (ELECTRODE) ×2 IMPLANT
PIN SAFETY STERILE (MISCELLANEOUS) IMPLANT
SHEET MEDIUM DRAPE 40X70 STRL (DRAPES) ×2 IMPLANT
SLEEVE SCD COMPRESS KNEE MED (MISCELLANEOUS) ×1 IMPLANT
SPONGE GAUZE 4X4 12PLY (GAUZE/BANDAGES/DRESSINGS) ×2 IMPLANT
SPONGE GAUZE 4X4 12PLY STER LF (GAUZE/BANDAGES/DRESSINGS) IMPLANT
SPONGE LAP 18X18 X RAY DECT (DISPOSABLE) IMPLANT
SPONGE LAP 4X18 X RAY DECT (DISPOSABLE) ×2 IMPLANT
STAPLER VISISTAT 35W (STAPLE) ×1 IMPLANT
STRIP CLOSURE SKIN 1/2X4 (GAUZE/BANDAGES/DRESSINGS) IMPLANT
SUT ETHILON 3 0 FSL (SUTURE) IMPLANT
SUT MNCRL AB 4-0 PS2 18 (SUTURE) ×3 IMPLANT
SUT SILK 2 0 SH (SUTURE) ×1 IMPLANT
SUT VIC AB 2-0 CT1 27 (SUTURE)
SUT VIC AB 2-0 CT1 TAPERPNT 27 (SUTURE) IMPLANT
SUT VIC AB 3-0 SH 27 (SUTURE)
SUT VIC AB 3-0 SH 27X BRD (SUTURE) IMPLANT
SUT VICRYL 3-0 CR8 SH (SUTURE) IMPLANT
SYR CONTROL 10ML LL (SYRINGE) ×4 IMPLANT
TOWEL OR 17X24 6PK STRL BLUE (TOWEL DISPOSABLE) ×4 IMPLANT
TOWEL OR NON WOVEN STRL DISP B (DISPOSABLE) ×1 IMPLANT
TUBE CONNECTING 20X1/4 (TUBING) ×2 IMPLANT
YANKAUER SUCT BULB TIP NO VENT (SUCTIONS) ×2 IMPLANT

## 2014-01-29 NOTE — Anesthesia Preprocedure Evaluation (Signed)
Anesthesia Evaluation  Patient identified by MRN, date of birth, ID band Patient awake    Reviewed: Allergy & Precautions, H&P , NPO status , Patient's Chart, lab work & pertinent test results  Airway Mallampati: I TM Distance: >3 FB Neck ROM: Full    Dental   Pulmonary former smoker,          Cardiovascular     Neuro/Psych    GI/Hepatic   Endo/Other    Renal/GU      Musculoskeletal   Abdominal   Peds  Hematology   Anesthesia Other Findings   Reproductive/Obstetrics                           Anesthesia Physical Anesthesia Plan  ASA: II  Anesthesia Plan: General   Post-op Pain Management:    Induction: Intravenous  Airway Management Planned: LMA  Additional Equipment:   Intra-op Plan:   Post-operative Plan: Extubation in OR  Informed Consent: I have reviewed the patients History and Physical, chart, labs and discussed the procedure including the risks, benefits and alternatives for the proposed anesthesia with the patient or authorized representative who has indicated his/her understanding and acceptance.     Plan Discussed with: CRNA and Surgeon  Anesthesia Plan Comments:         Anesthesia Quick Evaluation  

## 2014-01-29 NOTE — Transfer of Care (Signed)
Immediate Anesthesia Transfer of Care Note  Patient: [name] [name]  Procedure(s) Performed: Procedure(s): RIGHT AXILLARY SENTINEL LYMPHNODE BIOPSY (Right)  Patient Location: PACU  Anesthesia Type:General  Level of Consciousness: awake  Airway & Oxygen Therapy: Patient Spontanous Breathing and Patient connected to face mask oxygen  Post-op Assessment: Report given to PACU RN and Post -op Vital signs reviewed and stable  Post vital signs: Reviewed and stable  Complications: No apparent anesthesia complications

## 2014-01-29 NOTE — Anesthesia Postprocedure Evaluation (Signed)
Anesthesia Post Note  Patient: [name] [name]  Procedure(s) Performed: Procedure(s) (LRB): RIGHT AXILLARY SENTINEL LYMPHNODE BIOPSY (Right)  Anesthesia type: general  Patient location: PACU  Post pain: Pain level controlled  Post assessment: Patient's Cardiovascular Status Stable  Last Vitals:  Filed Vitals:   01/29/14 1249  BP: 123/65  Pulse: 63  Temp:   Resp: 14    Post vital signs: Reviewed and stable  Level of consciousness: sedated  Complications: No apparent anesthesia complications

## 2014-01-29 NOTE — Anesthesia Procedure Notes (Signed)
Procedure Name: LMA Insertion Date/Time: 01/29/2014 10:24 AM Performed by: Lieutenant Diego Pre-anesthesia Checklist: Patient identified, Emergency Drugs available, Suction available and Patient being monitored Patient Re-evaluated:Patient Re-evaluated prior to inductionOxygen Delivery Method: Circle System Utilized Preoxygenation: Pre-oxygenation with 100% oxygen Intubation Type: IV induction Ventilation: Mask ventilation without difficulty LMA: LMA inserted LMA Size: 4.0 Number of attempts: 1 Airway Equipment and Method: bite block Placement Confirmation: positive ETCO2 and breath sounds checked- equal and bilateral Tube secured with: Tape Dental Injury: Teeth and Oropharynx as per pre-operative assessment

## 2014-01-29 NOTE — Telephone Encounter (Signed)
per email used 10:30 for Dr Wandalee Ferdinand taken by Bjorn Loser use 11:00 slot

## 2014-01-29 NOTE — Telephone Encounter (Signed)
Charting error.

## 2014-01-29 NOTE — Interval H&P Note (Signed)
History and Physical Interval Note:  01/29/2014 10:06 AM  [name] [name]  has presented today for surgery, with the diagnosis of right breast cancer  The various methods of treatment have been discussed with the patient and family. After consideration of risks, benefits and other options for treatment, the patient has consented to  Procedure(s): RIGHT AXILLARY SENTINEL LYMPHNODE BIOPSY (Right) as a surgical intervention .  The patient's history has been reviewed, patient examined, no change in status, stable for surgery.  I have reviewed the patient's chart and labs.  Questions were answered to the patient's satisfaction.     Adin Hector

## 2014-01-29 NOTE — Discharge Instructions (Signed)
you take a shower, starting tomorrow. No tub baths.  Use an ice pack intermittently for 24 hours  No sports or heavy lifting for 2 weeks  You may return to work, light-duty in one to 2 weeks  Return to see Dr. Dalbert Batman in 3 weeks.  Be sure that you have appointments with the medical oncologist and radiation oncologist in the near future.   Post Anesthesia Home Care Instructions  Activity: Get plenty of rest for the remainder of the day. A responsible adult should stay with you for 24 hours following the procedure.  For the next 24 hours, DO NOT: -Drive a car -Paediatric nurse -Drink alcoholic beverages -Take any medication unless instructed by your physician -Make any legal decisions or sign important papers.  Meals: Start with liquid foods such as gelatin or soup. Progress to regular foods as tolerated. Avoid greasy, spicy, heavy foods. If nausea and/or vomiting occur, drink only clear liquids until the nausea and/or vomiting subsides. Call your physician if vomiting continues.  Special Instructions/Symptoms: Your throat may feel dry or sore from the anesthesia or the breathing tube placed in your throat during surgery. If this causes discomfort, gargle with warm salt water. The discomfort should disappear within 24 hours.

## 2014-01-29 NOTE — Telephone Encounter (Signed)
cld pt and left message of time & date of appt-mailed sch

## 2014-01-29 NOTE — Op Note (Signed)
Patient Name:           [name] [name]   Date of Surgery:        01/29/2014  Note: This dictation was prepared with Dragon/digital dictation along with [facility] technology. Any transcriptional errors that result from this process are unintentional.  Pre op Diagnosis:    Invasive cancer right breast, lower outer quadrant, 5.2 cm, low-grade papillary carcinoma, intracystic type. Stage T3, Nx    Post op Diagnosis:    Same  Procedure:                 Inject blue dye right breast, right axillary sentinel node biopsy  Surgeon:                     Edsel Petrin. Dalbert Batman, M.D., FACS  Assistant:                      None  Operative Indications:   This patient recently presented with a very large palpable mass at the 9:00 position of the right breast.  Right partial mastectomy was performed by me on 12/16/2013.  The final pathology report shows papillary carcinoma, intracystic type, solid variant. 5.2 cm. Widely negative margins. ER-positive 100%, PR-positive 100%, Ki-67 8%, HER-2 negative.  MRI of both breasts reveal no other abnormal areas other than the biopsy site.  Review of NIH guidelines revealed that this is a low-grade tumor with excellent long-term prognosis. Sentinel lymph node biopsy is felt to be prudent. Antiestrogen therapy is felt to be indicated. Radiation therapy is recommended on a case by case basis.  I've discussed this with the patient in detail. She has been referred to medical oncology and radiation oncology. She is brought to the operating room for right axillary sentinel node biopsy   Operative Findings:       I found 2 sentinel lymph nodes  Procedure in Detail:          The patient underwent injection of radionuclide in the right breast by the nuclear medicine technician, and the patient was then taken to the operating room and underwent general LMA anesthesia. Surgical time out was performed. Intravenous antibiotics were given. Following alcohol prep I injected 5 cc of blue  dye in the subareolar area in the upper outer quadrant of the right breast. This was 2 cc of methylene blue mixed with 3 cc of saline. We massaged the breast for a few minutes. We then prepped and draped the breast. 0.5% Marcaine with epinephrine was used as local infiltration anesthetic. An incision was made at the right axillary hairline. Dissection was carried down through subjacent tissue and the clavipectoral fascia was incised. Using the neoprobe I mapped out 2 sentinel lymph nodes which were very hot and blue. These were sent as separate specimens. After these 2 lymph nodes were removed there was essentially minimal radioactivity. Hemostasis was excellent and achieved with electrocautery. Wound was irrigated with saline. The deeper tissues were closed with 3-0 Vicryl sutures the skin closed with a running subcuticular suture of 4-0 Monocryl and Dermabond. The patient tolerated the procedure well and was taken to PACU in stable condition. EBL 10 cc. Counts correct. Complications none.     Edsel Petrin. Dalbert Batman, M.D., FACS General and Minimally Invasive Surgery Breast and Colorectal Surgery  01/29/2014 11:02 AM

## 2014-01-30 ENCOUNTER — Other Ambulatory Visit: Payer: Self-pay | Admitting: Oncology

## 2014-01-31 ENCOUNTER — Telehealth: Payer: Self-pay | Admitting: Oncology

## 2014-01-31 ENCOUNTER — Telehealth (INDEPENDENT_AMBULATORY_CARE_PROVIDER_SITE_OTHER): Payer: Self-pay

## 2014-01-31 ENCOUNTER — Encounter: Payer: Self-pay | Admitting: *Deleted

## 2014-01-31 NOTE — Telephone Encounter (Signed)
per pof 4/16 sch appt-cld & spoke w/pt and gave time & date for appt-pt understood

## 2014-01-31 NOTE — [facility] Oncology Navigator Note (Signed)
Per Dr. Khan oncotype ordered.  Requisition sent to pathology.  Received by Mandy.  PAC sent to [employer]. 

## 2014-01-31 NOTE — Telephone Encounter (Signed)
Message copied by Dois Davenport on Fri Jan 31, 2014 12:36 PM ------      Message from: Adin Hector      Created: Fri Jan 31, 2014 12:19 PM       Inform patient of Pathology report,.Nodes are negative. Good news.   Confirm Med-onc and rad-onc referrals.            hmi ------

## 2014-01-31 NOTE — Telephone Encounter (Signed)
Pt notified of path result and pt states she has onc and rad onc appts.

## 2014-01-31 NOTE — Progress Notes (Signed)
Quick Note:  Inform patient of Pathology report,.Nodes are negative. Good news. Confirm Med-onc and rad-onc referrals.  hmi ______

## 2014-02-03 ENCOUNTER — Encounter (HOSPITAL_BASED_OUTPATIENT_CLINIC_OR_DEPARTMENT_OTHER): Payer: Self-pay | Admitting: General Surgery

## 2014-02-04 ENCOUNTER — Telehealth: Payer: Self-pay | Admitting: Oncology

## 2014-02-04 NOTE — Progress Notes (Signed)
Location of Breast Cancer:Right breast invasive cancer of lower outer quadrant. 9 o'clock position 5.2 cm  Histology per Pathology Report: 01/29/2014  Diagnosis 1. Lymph node, sentinel, biopsy, Right Axillary #1 - ONE BENIGN LYMPH NODE (0/1). 2. Lymph node, sentinel, biopsy, Right Axillary #2 - ONE BENIGN LYMPH NODE (0/1). [name] MD Pathologist, Electronic Signature  12/16/2013 Diagnosis Breast, lumpectomy, Right - PAPILLARY CARCINOMA, INTRACYSTIC TYPE, SOLID VARIANT SEE COMMENT. - CARCINOMA IS 1 CM FROM THE NEAREST MARGIN (SUPERIOR). - SEE TUMOR SYNOPTIC TEMPLATE BELOW.  10/30/12:screening mammogram 11/18/13:u/s guided biopsy  Receptor Status: ER(+), PR (+), Her2-neu (-)  Did patient present with symptoms (if so, please note symptoms) or was this found on screening mammography?:Palpable mass found in right breast.  Past/Anticipated interventions by surgeon, if CRF:VOHKG breast partial mastectomy on 12/16/2013 by Dr.Ingram  Past/Anticipated interventions by medical oncology, if any: Chemotherapy:follow up in medical oncology after Oncotype results received.  Lymphedema issues, if any:No  Pain issues, if any:No  SAFETY ISSUES:  Prior radiation?No  Pacemaker/ICD?No  Possible current pregnancy?No  Is the patient on methotrexate?No   Current Complaints / other details:Widowed. 2 children.menarche age 82, menopause age 1.No HRT.First live birth age 65.GXP2    [name] Mcclimans Edison Nasuti, RN 02/04/2014,11:52 AM

## 2014-02-04 NOTE — Telephone Encounter (Signed)
, °

## 2014-02-06 ENCOUNTER — Encounter: Payer: Self-pay | Admitting: Radiation Oncology

## 2014-02-06 ENCOUNTER — Ambulatory Visit
Admission: RE | Admit: 2014-02-06 | Discharge: 2014-02-06 | Disposition: A | Discharge status: Home / Self Care | Payer: BC Managed Care – PPO | Source: Ambulatory Visit | Attending: Radiation Oncology | Admitting: Radiation Oncology

## 2014-02-06 VITALS — BP 129/68 | HR 75 | Temp 98.5°F | Wt 166.5 lb

## 2014-02-06 DIAGNOSIS — Z510 Encounter for antineoplastic radiation therapy: Secondary | ICD-10-CM | POA: Insufficient documentation

## 2014-02-06 DIAGNOSIS — C50319 Malignant neoplasm of lower-inner quadrant of unspecified female breast: Secondary | ICD-10-CM | POA: Insufficient documentation

## 2014-02-06 DIAGNOSIS — C50311 Malignant neoplasm of lower-inner quadrant of right female breast: Secondary | ICD-10-CM

## 2014-02-06 NOTE — Progress Notes (Signed)
Radiation Oncology         [phone]) [phone] ________________________________  Initial outpatient Consultation - Date: 02/06/2014   Name: [name] [name] MRN: [id number]   DOB: Sep 26, 1951  REFERRING PHYSICIAN: Adin Hector, MD  DIAGNOSIS: No diagnosis found.  STAGE: Breast cancer of lower-inner quadrant of right female breast   Primary site: Breast (Right)   Staging method: AJCC 7th Edition   Pathologic: Stage IIB (T3, NX, cM0) signed by Deatra Robinson, MD on 01/30/2014 10:09 PM   Summary: Stage IIB (T3, NX, cM0)  HISTORY OF PRESENT ILLNESS::[name] [name] is a 63 y.o. female  who presented with a palpable mass in the right breast. This was shown to be an atypical papillary lesion. She chose for breast conservation and a mastectomy was performed on 3-15. The final pathology showed a papillary carcinoma measuring 5.2 cm. Margins were negative. This was ER positive at 100% PR positive at 100% Ki-67 was 8%. HER-2 was negative. MRI of the bilateral breasts was negative. Review of a 9-gauge guidelines that are cancer conference showed that this was a low-grade tumor with an excellent prognosis. She underwent a sentinel lymph node biopsy on 01/06/2014 where 2 sentinel lymph nodes were removed. These were both negative. She met with medical oncology and an Oncotype has been ordered. She still has some soreness of her breast. She is feeling well. There is no family history of malignancy. She had menarche at 75 menopause at 51 it was never on hormone replacement. Her first live birth was at 69. She is GX P2.  PREVIOUS RADIATION THERAPY: No  PAST MEDICAL HISTORY:  has a past medical history of Arthritis; Wears glasses; and Full dentures.    PAST SURGICAL HISTORY: Past Surgical History  Procedure Laterality Date  . Appendectomy    . Tubal ligation    . Breast lumpectomy Right 12/16/2013    Procedure: LUMPECTOMY;  Surgeon: Adin Hector, MD;  Location: [place];  Service: General;   Laterality: Right;  . Axillary sentinel node biopsy Right 01/29/2014    Procedure: RIGHT AXILLARY SENTINEL LYMPHNODE BIOPSY;  Surgeon: Adin Hector, MD;  Location: [place];  Service: General;  Laterality: Right;    FAMILY HISTORY:  Family History  Problem Relation Age of Onset  . Hypertension Sister   . Hypertension Brother     SOCIAL HISTORY:  History  Substance Use Topics  . Smoking status: Former Smoker    Quit date: 12/10/1990  . Smokeless tobacco: Not on file  . Alcohol Use: No    ALLERGIES: Codeine  MEDICATIONS:  Current Outpatient Prescriptions  Medication Sig Dispense Refill  . celecoxib (CELEBREX) 200 MG capsule Take 200 mg by mouth as needed.      Marland Kitchen HYDROcodone-acetaminophen (NORCO) 5-325 MG per tablet Take 1-2 tablets by mouth every 6 (six) hours as needed.  30 tablet  0  . naproxen sodium (ANAPROX) 220 MG tablet Take 220 mg by mouth 2 (two) times daily with a meal. For pain.       No current facility-administered medications for this encounter.    REVIEW OF SYSTEMS:  A 15 point review of systems is documented in the electronic medical record. This was obtained by the nursing staff. However, I reviewed this with the patient to discuss relevant findings and make appropriate changes.  Pertinent items are noted in HPI.  PHYSICAL EXAM:  Filed Vitals:   02/06/14 1109  BP: 129/68  Pulse: 75  Temp: 98.5  F (36.9 C)  .166 lb 8 oz (75.524 kg). Pleasant female in no distress sitting comfortably on examining table. She has a healing lumpectomy incision on the lateral aspect of the right breast. Her sentinel lymph node incision has some glue but otherwise is healing well. No evidence of infection. She has no palpable abnormalities of the left breast. She is alert minus x3 with 5 out of 5 strength bilaterally.  LABORATORY DATA:  Lab Results  Component Value Date   WBC 5.4 01/28/2014   HGB 13.0 01/28/2014   HCT 39.7 01/28/2014   MCV 82.0 01/28/2014    PLT 177 01/28/2014   Lab Results  Component Value Date   NA 143 01/28/2014   K 4.2 01/28/2014   CL 103 12/11/2013   CO2 24 01/28/2014   Lab Results  Component Value Date   ALT 10 01/28/2014   AST 18 01/28/2014   ALKPHOS 101 01/28/2014   BILITOT 0.38 01/28/2014     RADIOGRAPHY: Nm Sentinel Node Inj-no Rpt (breast)  01/29/2014   CLINICAL DATA: Cancer right breast   Sulfur colloid was injected intradermally by the nuclear medicine  technologist for breast cancer sentinel node localization.       IMPRESSION: T3 N0 papillary carcinoma of the right breast  PLAN: I spoke with the patient and her sister today. We discussed the process of simulation and the placement tattoos. We discussed the role of radiation and decreasing local failures. Despite the excellent prognosis of this tumor it was a quite large size. For that reason I recommended adjuvant radiation to decrease the chance of local recurrence. We discussed 4-6 weeks of treatment as an outpatient. Given her widely negative margins I think that this will not be necessary. We discussed fatigue and skin changes as possible side effects during treatment. We discussed the low likelihood of symptomatic lung rib or heart damage. She will receive the results of her Oncotype back May 7. I will schedule her for simulation after that as I do feel it will be unlikely she will require chemotherapy. She has signed informed consent to facilitate this. I spent 60 minutes  face to face with the patient and more than 50% of that time was spent in counseling and/or coordination of care.   ------------------------------------------------  Thea Silversmith, MD

## 2014-02-10 ENCOUNTER — Encounter (HOSPITAL_COMMUNITY): Payer: Self-pay

## 2014-02-10 ENCOUNTER — Encounter: Payer: Self-pay | Admitting: *Deleted

## 2014-02-10 ENCOUNTER — Other Ambulatory Visit: Payer: Self-pay | Admitting: Oncology

## 2014-02-10 NOTE — Progress Notes (Signed)
Received Oncotype Dx results of 0.  Gave a copy to Dr. Jana Hakim since Dr. Humphrey Rolls is not here and took a copy to HIM to scan.

## 2014-02-11 ENCOUNTER — Encounter: Payer: Self-pay | Admitting: *Deleted

## 2014-02-11 NOTE — Progress Notes (Signed)
[facility] Psychosocial Distress Screening Clinical Social Work  Clinical Social Work was referred by distress screening protocol.  The patient scored a 6 on the Psychosocial Distress Thermometer which indicates moderate distress. Clinical Social Worker contacted pt at home to assess for distress and other psychosocial needs.  CSW left a message offering support and information on support services at [facility].  CSW encouraged pt to call with any questions or concerns.     [name] [name], MSW, [place] Worker [facility] [phone]

## 2014-02-13 ENCOUNTER — Encounter (INDEPENDENT_AMBULATORY_CARE_PROVIDER_SITE_OTHER): Payer: Self-pay | Admitting: *Deleted

## 2014-02-13 ENCOUNTER — Telehealth: Payer: Self-pay | Admitting: *Deleted

## 2014-02-13 NOTE — Telephone Encounter (Signed)
Called and spoke with patient and informed her that Dr. Humphrey Rolls is out on a leave of absence and needed to reschedule her appt. Confirmed new appt. For 04/29/14 at 1130 for labs and 12N for Dr. Jana Hakim.  Encouraged patient to call with any needs or concerns.

## 2014-02-20 ENCOUNTER — Ambulatory Visit: Payer: BC Managed Care – PPO | Admitting: Adult Health

## 2014-03-04 ENCOUNTER — Ambulatory Visit: Payer: BC Managed Care – PPO | Admitting: Oncology

## 2014-03-04 ENCOUNTER — Other Ambulatory Visit: Payer: BC Managed Care – PPO

## 2014-03-06 ENCOUNTER — Other Ambulatory Visit: Payer: Self-pay | Admitting: Radiation Oncology

## 2014-03-07 ENCOUNTER — Ambulatory Visit (INDEPENDENT_AMBULATORY_CARE_PROVIDER_SITE_OTHER): Payer: BC Managed Care – PPO | Admitting: General Surgery

## 2014-03-07 ENCOUNTER — Encounter (INDEPENDENT_AMBULATORY_CARE_PROVIDER_SITE_OTHER): Payer: Self-pay | Admitting: General Surgery

## 2014-03-07 VITALS — BP 126/70 | HR 60 | Temp 98.2°F | Resp 16 | Ht 64.0 in | Wt 169.2 lb

## 2014-03-07 DIAGNOSIS — C50319 Malignant neoplasm of lower-inner quadrant of unspecified female breast: Secondary | ICD-10-CM

## 2014-03-07 DIAGNOSIS — C50311 Malignant neoplasm of lower-inner quadrant of right female breast: Secondary | ICD-10-CM

## 2014-03-07 NOTE — Progress Notes (Signed)
Patient ID: [name] [name], female   DOB: [birth year], 63 y.o.   MRN: [id number] History: This patient underwent right axillary sentinel lymph node biopsy on 01/29/2014. Final pathology shows that two(2) lymph nodes are negative.  Right partial mastectomy was previously performed by me on 12/16/2013.  The final pathology report shows papillary carcinoma, intracystic type, solid variant. 5.2 cm. Widely negative margins. ER-positive 100%, PR-positive 100%, Ki-67 8%, HER-2 negative.  MRI of both breasts reveal no other abnormal areas other than the biopsy site.  Review of NIH guidelines revealed that this is a low-grade tumor with excellent long-term prognosis. Sentinel lymph node biopsy is felt to be prudent. Antiestrogen therapy is felt to be indicated. Radiation therapy is recommended on a case by case basis.  She is doing very well from both surgeries. No problems with wounds. No arm pain or numbness. Good range of motion. No swelling. She has seen Dr. Marcy Panning. Oncotype is pending. Individual antiestrogen therapy is anticipated.  She has seen Dr. Thea Silversmith. Radiation therapy is planned.  Exam: Patient is in good spirits and looks well. Right breast shows a healing incision laterally with excellent cosmetic result. No infection or hematoma. Right axillary incision is also healing normally. No hematoma. 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 Pathologic stage T3, N0 Doing well with very good cosmetic result following extensive right partial mastectomy and subsequent right axilla sentinel node biopsy  Plan:  Proceed with radiation therapy Obtain Oncotype-DX results Followup with medical oncology following completion of radiation therapy for decision regarding antiestrogen therapy Activities discussed Return to see me in 4 months.    Edsel Petrin. Dalbert Batman,  M.D., [facility] General and Minimally invasive Surgery Breast and Colorectal Surgery Office:   [phone] Pager:   [phone]

## 2014-03-07 NOTE — Patient Instructions (Signed)
You are recovering from your right breast and right axillary lymph node surgery without any obvious surgical complications.  You may resume normal physical activities without restriction  It is safe to proceed with radiation therapy  Return to see Dr. Dalbert Batman in 4 months

## 2014-03-12 ENCOUNTER — Ambulatory Visit
Admission: RE | Admit: 2014-03-12 | Discharge: 2014-03-12 | Disposition: A | Discharge status: Home / Self Care | Payer: BC Managed Care – PPO | Source: Ambulatory Visit | Attending: Radiation Oncology | Admitting: Radiation Oncology

## 2014-03-12 DIAGNOSIS — Z510 Encounter for antineoplastic radiation therapy: Secondary | ICD-10-CM | POA: Diagnosis not present

## 2014-03-12 DIAGNOSIS — C50311 Malignant neoplasm of lower-inner quadrant of right female breast: Secondary | ICD-10-CM

## 2014-03-13 DIAGNOSIS — Z510 Encounter for antineoplastic radiation therapy: Secondary | ICD-10-CM | POA: Diagnosis not present

## 2014-03-13 NOTE — Progress Notes (Signed)
Radiation Oncology         [phone] ________________________________  Name: [name] [name]      MRN: [id number]          Date: 03/12/2014              DOB: [birth year]  Optical Surface Tracking Plan:  Since intensity modulated radiotherapy (IMRT) and 3D conformal radiation treatment methods are predicated on accurate and precise positioning for treatment, intrafraction motion monitoring is medically necessary to ensure accurate and safe treatment delivery.  The ability to quantify intrafraction motion without excessive ionizing radiation dose can only be performed with optical surface tracking. Accordingly, surface imaging offers the opportunity to obtain 3D measurements of patient position throughout IMRT and 3D treatments without excessive radiation exposure.  I am ordering optical surface tracking for this patient's upcoming course of radiotherapy. ________________________________ Signature   Reference:   Ursula Alert, J, et al. Surface imaging-based analysis of intrafraction motion for breast radiotherapy patients.Journal of [place], n. 6, nov. 2014. ISSN [id number].   Available at: <http://www.jacmp.org/index.php/jacmp/article/view/4957>.

## 2014-03-13 NOTE — Progress Notes (Addendum)
Name: PATTIJO JUSTE   MRN: [id number]  Date:  03/12/14 DOB: 21-May-1951  Status:outpatient    DIAGNOSIS: Breast cancer.  CONSENT VERIFIED: yes   SET UP: Patient is setup supine   IMMOBILIZATION:  The following immobilization was used:Custom Moldable Pillow, breast board.   NARRATIVE: Ms. Goughnour was brought to the [facility].  Identity was confirmed.  All relevant records and images related to the planned course of therapy were reviewed.  Then, the patient was positioned in a stable reproducible clinical set-up for radiation therapy.  Wires were placed to delineate the clinical extent of breast tissue. A wire was placed on the scar as well.  CT images were obtained.  An isocenter was placed. Skin markings were placed.  The CT images were loaded into the planning software where the target and avoidance structures were contoured.  The radiation prescription was entered and confirmed. The patient was discharged in stable condition and tolerated simulation well.    TREATMENT PLANNING NOTE:  Treatment planning then occurred. I have requested : MLC's, isodose plan, basic dose calculation  I personally designed and supervised the construction of 3 medically necessary complex treatment devices for the protection of critical normal structures including the lungs and contralateral breast as well as the immobilization device which is necessary for set up certainty.   3D simulation will be performed. I have requested and will analyze a dose volume histogram of the lung, heart and lumpectomy cavity.

## 2014-03-14 DIAGNOSIS — Z510 Encounter for antineoplastic radiation therapy: Secondary | ICD-10-CM | POA: Diagnosis not present

## 2014-03-17 ENCOUNTER — Encounter: Payer: Self-pay | Admitting: Radiation Oncology

## 2014-03-17 ENCOUNTER — Ambulatory Visit
Admission: RE | Admit: 2014-03-17 | Discharge: 2014-03-17 | Disposition: A | Discharge status: Home / Self Care | Payer: BC Managed Care – PPO | Source: Ambulatory Visit | Attending: Radiation Oncology | Admitting: Radiation Oncology

## 2014-03-17 DIAGNOSIS — Z510 Encounter for antineoplastic radiation therapy: Secondary | ICD-10-CM | POA: Diagnosis not present

## 2014-03-17 NOTE — Progress Notes (Signed)
Simulation Verification Note  The patient was brought to the treatment unit and placed in the planned treatment position. The clinical setup was verified. Then port films were obtained and uploaded to the radiation oncology medical record software.  The treatment beams were carefully compared against the planned radiation fields. The position location and shape of the radiation fields was reviewed. They targeted volume of tissue appears to be appropriately covered by the radiation beams. Organs at risk appear to be excluded as planned.  Based on my personal review, I approved the simulation verification. The patient's treatment will proceed as planned.  -----------------------------------  [name] Hopfensperger, MD  

## 2014-03-18 ENCOUNTER — Ambulatory Visit
Admission: RE | Admit: 2014-03-18 | Discharge: 2014-03-18 | Disposition: A | Discharge status: Home / Self Care | Payer: BC Managed Care – PPO | Source: Ambulatory Visit | Attending: Radiation Oncology | Admitting: Radiation Oncology

## 2014-03-18 VITALS — BP 113/70 | HR 69 | Temp 98.1°F | Resp 20 | Wt 169.9 lb

## 2014-03-18 DIAGNOSIS — Z510 Encounter for antineoplastic radiation therapy: Secondary | ICD-10-CM | POA: Diagnosis not present

## 2014-03-18 DIAGNOSIS — C50311 Malignant neoplasm of lower-inner quadrant of right female breast: Secondary | ICD-10-CM

## 2014-03-18 MED ORDER — ALRA NON-METALLIC DEODORANT (RAD-ONC)
1.0000 | Freq: Once | TOPICAL | Status: AC
Start: 2014-03-18 — End: 2014-03-18
  Administered 2014-03-18: 1 via TOPICAL

## 2014-03-18 MED ORDER — RADIAPLEXRX EX GEL
Freq: Once | CUTANEOUS | Status: AC
  Administered 2014-03-18: 1 via TOPICAL

## 2014-03-18 NOTE — Progress Notes (Signed)
  Radiation Oncology         ([phone]) [phone] ________________________________  Name: [name] [name] MRN: [id number]  Date: 03/18/2014  DOB: 02/23/1951  Weekly Radiation Therapy Management Breast cancer of lower-inner quadrant of right female breast   Primary site: Breast (Right)   Staging method: AJCC 7th Edition   Pathologic: Stage IIB (T3, N0, cM0)    Summary: Stage IIB (T3, N0, cM0)  Current Dose: 4 Gy     Planned Dose:  50 + Gy  Narrative . . . . . . . . The patient presents for routine under treatment assessment.                                   The patient is without complaint.                                 Set-up films were reviewed.                                 The chart was checked. Physical Findings. . .  weight is 169 lb 14.4 oz (77.066 kg). Her oral temperature is 98.1 F (36.7 C). Her blood pressure is 113/70 and her pulse is 69. Her respiration is 20 and oxygen saturation is 100%. . No significant skin reaction in the right breast at this time. Impression . . . . . . . The patient is tolerating radiation. Plan . . . . . . . . . . . . Continue treatment as planned.  ________________________________   Blair Promise, PhD, MD

## 2014-03-18 NOTE — Progress Notes (Addendum)
Weekly rad txs,  2nd treatment right breast,  Tenderness only, patient education done, discussed fatigue, skin irritation,increase protein in diet,stay hydrated,drink plenty water, rad book, alra deodorant, and radiaplex gel given to patient with instructions of use of products, will see MD weekly and prn, gave Rebeca Allegra 's business card,teach back given 4:27 PMgood appetite,

## 2014-03-19 ENCOUNTER — Ambulatory Visit: Payer: BC Managed Care – PPO | Admitting: Radiation Oncology

## 2014-03-19 ENCOUNTER — Ambulatory Visit
Admission: RE | Admit: 2014-03-19 | Discharge: 2014-03-19 | Disposition: A | Discharge status: Home / Self Care | Payer: BC Managed Care – PPO | Source: Ambulatory Visit | Attending: Radiation Oncology | Admitting: Radiation Oncology

## 2014-03-19 DIAGNOSIS — Z510 Encounter for antineoplastic radiation therapy: Secondary | ICD-10-CM | POA: Diagnosis not present

## 2014-03-20 ENCOUNTER — Ambulatory Visit: Payer: BC Managed Care – PPO

## 2014-03-20 ENCOUNTER — Ambulatory Visit
Admission: RE | Admit: 2014-03-20 | Discharge: 2014-03-20 | Disposition: A | Discharge status: Home / Self Care | Payer: BC Managed Care – PPO | Source: Ambulatory Visit | Attending: Radiation Oncology | Admitting: Radiation Oncology

## 2014-03-20 DIAGNOSIS — Z510 Encounter for antineoplastic radiation therapy: Secondary | ICD-10-CM | POA: Diagnosis not present

## 2014-03-21 ENCOUNTER — Ambulatory Visit
Admission: RE | Admit: 2014-03-21 | Discharge: 2014-03-21 | Disposition: A | Discharge status: Home / Self Care | Payer: BC Managed Care – PPO | Source: Ambulatory Visit | Attending: Radiation Oncology | Admitting: Radiation Oncology

## 2014-03-21 DIAGNOSIS — Z510 Encounter for antineoplastic radiation therapy: Secondary | ICD-10-CM | POA: Diagnosis not present

## 2014-03-24 ENCOUNTER — Ambulatory Visit
Admission: RE | Admit: 2014-03-24 | Discharge: 2014-03-24 | Disposition: A | Discharge status: Home / Self Care | Payer: BC Managed Care – PPO | Source: Ambulatory Visit | Attending: Radiation Oncology | Admitting: Radiation Oncology

## 2014-03-24 DIAGNOSIS — Z510 Encounter for antineoplastic radiation therapy: Secondary | ICD-10-CM | POA: Diagnosis not present

## 2014-03-25 ENCOUNTER — Ambulatory Visit
Admission: RE | Admit: 2014-03-25 | Discharge: 2014-03-25 | Disposition: A | Discharge status: Home / Self Care | Payer: BC Managed Care – PPO | Source: Ambulatory Visit | Attending: Radiation Oncology | Admitting: Radiation Oncology

## 2014-03-25 DIAGNOSIS — C50311 Malignant neoplasm of lower-inner quadrant of right female breast: Secondary | ICD-10-CM

## 2014-03-25 DIAGNOSIS — Z510 Encounter for antineoplastic radiation therapy: Secondary | ICD-10-CM | POA: Diagnosis not present

## 2014-03-25 NOTE — Progress Notes (Signed)
Weekly Management Note Current Dose:  12 Gy  Projected Dose: 50 Gy   Narrative:  The patient presents for routine under treatment assessment.  CBCT/MVCT images/Port film x-rays were reviewed.  The chart was checked. Doing well. No complaints except some soreness in the lateral breast near scar. Helps to put cotton between bra and skin.   Physical Findings: Weight: Slight hyperpigmentation of the right breast  Impression:  The patient is tolerating radiation.  Plan:  Continue treatment as planned. Doing well. Continue radiaplex.

## 2014-03-26 ENCOUNTER — Ambulatory Visit
Admission: RE | Admit: 2014-03-26 | Discharge: 2014-03-26 | Disposition: A | Discharge status: Home / Self Care | Payer: BC Managed Care – PPO | Source: Ambulatory Visit | Attending: Radiation Oncology | Admitting: Radiation Oncology

## 2014-03-26 DIAGNOSIS — Z510 Encounter for antineoplastic radiation therapy: Secondary | ICD-10-CM | POA: Diagnosis not present

## 2014-03-27 ENCOUNTER — Ambulatory Visit
Admission: RE | Admit: 2014-03-27 | Discharge: 2014-03-27 | Disposition: A | Discharge status: Home / Self Care | Payer: BC Managed Care – PPO | Source: Ambulatory Visit | Attending: Radiation Oncology | Admitting: Radiation Oncology

## 2014-03-27 DIAGNOSIS — Z510 Encounter for antineoplastic radiation therapy: Secondary | ICD-10-CM | POA: Diagnosis not present

## 2014-03-28 ENCOUNTER — Ambulatory Visit
Admission: RE | Admit: 2014-03-28 | Discharge: 2014-03-28 | Disposition: A | Discharge status: Home / Self Care | Payer: BC Managed Care – PPO | Source: Ambulatory Visit | Attending: Radiation Oncology | Admitting: Radiation Oncology

## 2014-03-28 DIAGNOSIS — Z510 Encounter for antineoplastic radiation therapy: Secondary | ICD-10-CM | POA: Diagnosis not present

## 2014-03-31 ENCOUNTER — Ambulatory Visit
Admission: RE | Admit: 2014-03-31 | Discharge: 2014-03-31 | Disposition: A | Discharge status: Home / Self Care | Payer: BC Managed Care – PPO | Source: Ambulatory Visit | Attending: Radiation Oncology | Admitting: Radiation Oncology

## 2014-03-31 DIAGNOSIS — Z510 Encounter for antineoplastic radiation therapy: Secondary | ICD-10-CM | POA: Diagnosis not present

## 2014-04-01 ENCOUNTER — Ambulatory Visit
Admission: RE | Admit: 2014-04-01 | Discharge: 2014-04-01 | Disposition: A | Discharge status: Home / Self Care | Payer: BC Managed Care – PPO | Source: Ambulatory Visit | Attending: Radiation Oncology | Admitting: Radiation Oncology

## 2014-04-01 VITALS — BP 98/63 | HR 78 | Temp 98.1°F | Wt 169.9 lb

## 2014-04-01 DIAGNOSIS — Z510 Encounter for antineoplastic radiation therapy: Secondary | ICD-10-CM | POA: Diagnosis not present

## 2014-04-01 DIAGNOSIS — C50311 Malignant neoplasm of lower-inner quadrant of right female breast: Secondary | ICD-10-CM

## 2014-04-01 NOTE — Progress Notes (Signed)
Weekly Management Note Current Dose:  24 Gy  Projected Dose: 50 Gy   Narrative:  The patient presents for routine under treatment assessment.  CBCT/MVCT images/Port film x-rays were reviewed.  The chart was checked. Doing well. No complaints. Asked about sun protection for the beach  Physical Findings: Weight: 169 lb 14.4 oz (77.066 kg). Unchanged  Impression:  The patient is tolerating radiation.  Plan:  Continue treatment as planned. Gave printouts on rashguards at several stores and emphasized UV protection.

## 2014-04-01 NOTE — Progress Notes (Signed)
Weekly assessment of radiation to right breat.Mild discoloration , no peeling, no pain or fatigue.continue application of radiation bid.

## 2014-04-02 ENCOUNTER — Ambulatory Visit
Admission: RE | Admit: 2014-04-02 | Discharge: 2014-04-02 | Disposition: A | Discharge status: Home / Self Care | Payer: BC Managed Care – PPO | Source: Ambulatory Visit | Attending: Radiation Oncology | Admitting: Radiation Oncology

## 2014-04-02 DIAGNOSIS — Z510 Encounter for antineoplastic radiation therapy: Secondary | ICD-10-CM | POA: Diagnosis not present

## 2014-04-03 ENCOUNTER — Ambulatory Visit
Admission: RE | Admit: 2014-04-03 | Discharge: 2014-04-03 | Disposition: A | Discharge status: Home / Self Care | Payer: BC Managed Care – PPO | Source: Ambulatory Visit | Attending: Radiation Oncology | Admitting: Radiation Oncology

## 2014-04-03 DIAGNOSIS — Z510 Encounter for antineoplastic radiation therapy: Secondary | ICD-10-CM | POA: Diagnosis not present

## 2014-04-04 ENCOUNTER — Ambulatory Visit
Admission: RE | Admit: 2014-04-04 | Discharge: 2014-04-04 | Disposition: A | Discharge status: Home / Self Care | Payer: BC Managed Care – PPO | Source: Ambulatory Visit | Attending: Radiation Oncology | Admitting: Radiation Oncology

## 2014-04-04 DIAGNOSIS — Z510 Encounter for antineoplastic radiation therapy: Secondary | ICD-10-CM | POA: Diagnosis not present

## 2014-04-07 ENCOUNTER — Ambulatory Visit
Admission: RE | Admit: 2014-04-07 | Discharge: 2014-04-07 | Disposition: A | Discharge status: Home / Self Care | Payer: BC Managed Care – PPO | Source: Ambulatory Visit | Attending: Radiation Oncology | Admitting: Radiation Oncology

## 2014-04-07 DIAGNOSIS — Z510 Encounter for antineoplastic radiation therapy: Secondary | ICD-10-CM | POA: Diagnosis not present

## 2014-04-08 ENCOUNTER — Ambulatory Visit
Admission: RE | Admit: 2014-04-08 | Discharge: 2014-04-08 | Disposition: A | Discharge status: Home / Self Care | Payer: BC Managed Care – PPO | Source: Ambulatory Visit | Attending: Radiation Oncology | Admitting: Radiation Oncology

## 2014-04-08 VITALS — BP 119/79 | HR 74 | Temp 98.3°F | Wt 167.9 lb

## 2014-04-08 DIAGNOSIS — C50311 Malignant neoplasm of lower-inner quadrant of right female breast: Secondary | ICD-10-CM

## 2014-04-08 DIAGNOSIS — Z510 Encounter for antineoplastic radiation therapy: Secondary | ICD-10-CM | POA: Diagnosis not present

## 2014-04-08 MED ORDER — RADIAPLEXRX EX GEL
Freq: Once | CUTANEOUS | Status: AC
Start: 2014-04-08 — End: 2014-04-08
  Administered 2014-04-08: 16:00:00 via TOPICAL

## 2014-04-08 NOTE — Progress Notes (Signed)
Weekly assessment of radiation to right [employer].ch 17 of 25 treatments.Mild discoloration and hyperpigmentation of mammary fold.Denies pain.giventube of radiaplex.

## 2014-04-08 NOTE — Progress Notes (Signed)
Weekly Management Note Current Dose: 34  Gy  Projected Dose: 50 Gy   Narrative:  The patient presents for routine under treatment assessment.  CBCT/MVCT images/Port film x-rays were reviewed.  The chart was checked. Doing well. No complaints.   Physical Findings: Weight: 167 lb 14.4 oz (76.159 kg). Mild hyperpigmentation.   Impression:  The patient is tolerating radiation.  Plan:  Continue treatment as planned. Continue radiaplex.

## 2014-04-09 ENCOUNTER — Ambulatory Visit
Admission: RE | Admit: 2014-04-09 | Discharge: 2014-04-09 | Disposition: A | Discharge status: Home / Self Care | Payer: BC Managed Care – PPO | Source: Ambulatory Visit | Attending: Radiation Oncology | Admitting: Radiation Oncology

## 2014-04-09 DIAGNOSIS — Z510 Encounter for antineoplastic radiation therapy: Secondary | ICD-10-CM | POA: Diagnosis not present

## 2014-04-10 ENCOUNTER — Ambulatory Visit
Admission: RE | Admit: 2014-04-10 | Discharge: 2014-04-10 | Disposition: A | Discharge status: Home / Self Care | Payer: BC Managed Care – PPO | Source: Ambulatory Visit | Attending: Radiation Oncology | Admitting: Radiation Oncology

## 2014-04-10 DIAGNOSIS — Z510 Encounter for antineoplastic radiation therapy: Secondary | ICD-10-CM | POA: Diagnosis not present

## 2014-04-11 ENCOUNTER — Ambulatory Visit
Admission: RE | Admit: 2014-04-11 | Discharge: 2014-04-11 | Disposition: A | Discharge status: Home / Self Care | Payer: BC Managed Care – PPO | Source: Ambulatory Visit | Attending: Radiation Oncology | Admitting: Radiation Oncology

## 2014-04-11 DIAGNOSIS — Z510 Encounter for antineoplastic radiation therapy: Secondary | ICD-10-CM | POA: Diagnosis not present

## 2014-04-14 ENCOUNTER — Ambulatory Visit
Admission: RE | Admit: 2014-04-14 | Discharge: 2014-04-14 | Disposition: A | Discharge status: Home / Self Care | Payer: BC Managed Care – PPO | Source: Ambulatory Visit | Attending: Radiation Oncology | Admitting: Radiation Oncology

## 2014-04-14 DIAGNOSIS — Z510 Encounter for antineoplastic radiation therapy: Secondary | ICD-10-CM | POA: Diagnosis not present

## 2014-04-15 ENCOUNTER — Ambulatory Visit
Admission: RE | Admit: 2014-04-15 | Discharge: 2014-04-15 | Disposition: A | Discharge status: Home / Self Care | Payer: BC Managed Care – PPO | Source: Ambulatory Visit | Attending: Radiation Oncology | Admitting: Radiation Oncology

## 2014-04-15 VITALS — BP 122/72 | HR 69 | Temp 97.9°F | Wt 164.5 lb

## 2014-04-15 DIAGNOSIS — C50311 Malignant neoplasm of lower-inner quadrant of right female breast: Secondary | ICD-10-CM

## 2014-04-15 DIAGNOSIS — Z510 Encounter for antineoplastic radiation therapy: Secondary | ICD-10-CM | POA: Diagnosis not present

## 2014-04-15 NOTE — Progress Notes (Signed)
Weekly assessment of right breast.Has 3 more treatments remaining in boost.Hyperpigmentation prominent of right mammary fold.Continue application of radiaplex.Mild fatigue.

## 2014-04-15 NOTE — Progress Notes (Signed)
Weekly Management Note Current Dose: 44  Gy  Projected Dose:50  Gy   Narrative:  The patient presents for routine under treatment assessment.  CBCT/MVCT images/Port film x-rays were reviewed.  The chart was checked. Some breast soreness. Leaving for [facility] on Friday.   Physical Findings: Weight: 164 lb 8 oz (74.617 kg). Dark left breast. No moist desquamation.   Impression:  The patient is tolerating radiation.  Plan:  Continue treatment as planned.continue radiaplex. Discussed what to do if moist desquamation (neosporin and gave non adherent bandages.) Has appt with med onc in July. 1 mo follow up with me.

## 2014-04-16 ENCOUNTER — Ambulatory Visit
Admission: RE | Admit: 2014-04-16 | Discharge: 2014-04-16 | Disposition: A | Discharge status: Home / Self Care | Payer: BC Managed Care – PPO | Source: Ambulatory Visit | Attending: Radiation Oncology | Admitting: Radiation Oncology

## 2014-04-16 DIAGNOSIS — Z510 Encounter for antineoplastic radiation therapy: Secondary | ICD-10-CM | POA: Diagnosis not present

## 2014-04-17 ENCOUNTER — Encounter: Payer: Self-pay | Admitting: Radiation Oncology

## 2014-04-17 ENCOUNTER — Ambulatory Visit
Admission: RE | Admit: 2014-04-17 | Discharge: 2014-04-17 | Disposition: A | Discharge status: Home / Self Care | Payer: BC Managed Care – PPO | Source: Ambulatory Visit | Attending: Radiation Oncology | Admitting: Radiation Oncology

## 2014-04-17 DIAGNOSIS — Z510 Encounter for antineoplastic radiation therapy: Secondary | ICD-10-CM | POA: Diagnosis not present

## 2014-04-21 ENCOUNTER — Ambulatory Visit: Payer: BC Managed Care – PPO

## 2014-04-22 ENCOUNTER — Ambulatory Visit: Payer: BC Managed Care – PPO

## 2014-04-23 ENCOUNTER — Ambulatory Visit: Payer: BC Managed Care – PPO

## 2014-04-24 ENCOUNTER — Ambulatory Visit: Payer: BC Managed Care – PPO

## 2014-04-25 ENCOUNTER — Ambulatory Visit: Payer: BC Managed Care – PPO

## 2014-04-28 ENCOUNTER — Other Ambulatory Visit: Payer: Self-pay | Admitting: *Deleted

## 2014-04-28 ENCOUNTER — Ambulatory Visit: Payer: BC Managed Care – PPO

## 2014-04-28 DIAGNOSIS — C50311 Malignant neoplasm of lower-inner quadrant of right female breast: Secondary | ICD-10-CM

## 2014-04-29 ENCOUNTER — Ambulatory Visit (HOSPITAL_BASED_OUTPATIENT_CLINIC_OR_DEPARTMENT_OTHER): Payer: BC Managed Care – PPO | Admitting: Oncology

## 2014-04-29 ENCOUNTER — Ambulatory Visit: Payer: BC Managed Care – PPO

## 2014-04-29 ENCOUNTER — Other Ambulatory Visit (HOSPITAL_BASED_OUTPATIENT_CLINIC_OR_DEPARTMENT_OTHER): Payer: BC Managed Care – PPO

## 2014-04-29 VITALS — BP 133/71 | HR 82 | Temp 98.1°F | Resp 18 | Ht 64.0 in | Wt 166.5 lb

## 2014-04-29 DIAGNOSIS — Z170 Estrogen receptor positive status [ER+]: Secondary | ICD-10-CM

## 2014-04-29 DIAGNOSIS — C50311 Malignant neoplasm of lower-inner quadrant of right female breast: Secondary | ICD-10-CM

## 2014-04-29 DIAGNOSIS — C50419 Malignant neoplasm of upper-outer quadrant of unspecified female breast: Secondary | ICD-10-CM

## 2014-04-29 LAB — CBC WITH DIFFERENTIAL/PLATELET
BASO%: 0.6 % (ref 0.0–2.0)
BASOS ABS: 0 10*3/uL (ref 0.0–0.1)
EOS%: 1.7 % (ref 0.0–7.0)
Eosinophils Absolute: 0.1 10*3/uL (ref 0.0–0.5)
HEMATOCRIT: 39.4 % (ref 34.8–46.6)
HEMOGLOBIN: 12.7 g/dL (ref 11.6–15.9)
LYMPH#: 0.6 10*3/uL — AB (ref 0.9–3.3)
LYMPH%: 16.3 % (ref 14.0–49.7)
MCH: 26.3 pg (ref 25.1–34.0)
MCHC: 32.2 g/dL (ref 31.5–36.0)
MCV: 81.8 fL (ref 79.5–101.0)
MONO#: 0.4 10*3/uL (ref 0.1–0.9)
MONO%: 10 % (ref 0.0–14.0)
NEUT#: 2.8 10*3/uL (ref 1.5–6.5)
NEUT%: 71.4 % (ref 38.4–76.8)
Platelets: 153 10*3/uL (ref 145–400)
RBC: 4.81 10*6/uL (ref 3.70–5.45)
RDW: 13.7 % (ref 11.2–14.5)
WBC: 4 10*3/uL (ref 3.9–10.3)

## 2014-04-29 LAB — COMPREHENSIVE METABOLIC PANEL (CC13)
ALBUMIN: 3.9 g/dL (ref 3.5–5.0)
ALT: 18 U/L (ref 0–55)
ANION GAP: 8 meq/L (ref 3–11)
AST: 20 U/L (ref 5–34)
Alkaline Phosphatase: 75 U/L (ref 40–150)
BUN: 14.1 mg/dL (ref 7.0–26.0)
CO2: 28 meq/L (ref 22–29)
CREATININE: 1 mg/dL (ref 0.6–1.1)
Calcium: 9.8 mg/dL (ref 8.4–10.4)
Chloride: 107 mEq/L (ref 98–109)
Glucose: 113 mg/dl (ref 70–140)
POTASSIUM: 3.8 meq/L (ref 3.5–5.1)
Sodium: 143 mEq/L (ref 136–145)
Total Bilirubin: 0.54 mg/dL (ref 0.20–1.20)
Total Protein: 7.1 g/dL (ref 6.4–8.3)

## 2014-04-29 MED ORDER — TAMOXIFEN CITRATE 20 MG PO TABS: 20.0000 mg | ORAL_TABLET | Freq: Every day | ORAL | Status: DC

## 2014-04-29 NOTE — Progress Notes (Signed)
[facility]  Telephone:([phone] Fax:(336) [phone]     ID: [name] [name] DOB: 1951-01-22  MR#: [id number]  SVX#:[id number]  Patient Care Team: [name] Dew, FNP as PCP - General (Family Medicine)  CHIEF COMPLAINT:   CURRENT TREATMENT:    BREAST CANCER HISTORY: From doctor [name] [name] 01/28/2014 intake nodes:   "[name] [name] is a 63 y.o. female. Has not had a mammogram in about 12 years. Approximately in December patient felt a mass in the lateral right breast. She does tell me that she has occasionally had some pain. She had mammogram performed that showed a 6 cm irregular mass in the upper outer quadrant of the right breast. Ultrasound of the right axilla was normal. The left breast showed a benign lymph node at the 3:00 position. Patient underwent a biopsy of the right breast mass that showed atypical papillary lesion. Because of this she was seen by Dr. Fanny [name]. He recommended a lumpectomy and removal of this lesion. Patient went on to have a lumpectomy performed on 11/27/2013. 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 Margins were negative. Tumor was ER +100% PR +100% HER-2/neu negative with a proliferation marker Ki-67 8%. She went on to have MRI of the breasts performed that were negative. Patient's case was discussed at the multidisciplinary breast conference. She was recommended further workup including doing sentinel lymph node."  Her subsequent history is as detailed below.  INTERVAL HISTORY: Center returns today for followup of her breast cancer. Since her last visit here she completed her radiation treatments. She generally did well with dose, and was able to continue to work full-time. She is establishing herself in my service today  REVIEW OF SYSTEMS: Over the last week or so she has developed some peeling under that right breast. She is using an antibiotic cream there. She is also using radio  plaques over the breast and what is already a lighting up a little bit". She has minimal hot flashes. She does not complain of vaginal dryness, insomnia, progressive weight gain, mood changes, or other menopausal symptoms.. Aside from these issues, a detailed review of systems was entirely negative  PAST MEDICAL HISTORY: Past Medical History  Diagnosis Date  . Arthritis     right knee  . Wears glasses   . Full dentures     PAST SURGICAL HISTORY: Past Surgical History  Procedure Laterality Date  . Appendectomy    . Tubal ligation    . Breast lumpectomy Right 12/16/2013    Procedure: LUMPECTOMY;  Surgeon: [name] Hector, MD;  Location: [facility];  Service: General;  Laterality: Right;  . Axillary sentinel node biopsy Right 01/29/2014    Procedure: RIGHT AXILLARY SENTINEL LYMPHNODE BIOPSY;  Surgeon: [name] Hector, MD;  Location: [facility];  Service: General;  Laterality: Right;    FAMILY HISTORY Family History  Problem Relation Age of Onset  . Hypertension Sister   . Hypertension Brother    the patient's father died at the age of 21 from cancer, but the patient does not know the primary site. The patient's mother died "of natural causes" at age 20. The patient has 2 brothers, one half-sister. There is no cancer history in the family to her knowledge  GYNECOLOGIC HISTORY:  No LMP recorded. Patient is postmenopausal. Menarche age 23, first live birth age 43, the patient is [name] [name] P2. She went through the change of life around age 34. She did  not take hormone replacement.  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 She is widowed. At home she lives with her sister [name] [name] date. The patient's children are [name] [name] who works as a Runner, broadcasting/film/video in [place], and [name] [name],, works at urgent care here in town. The patient has 5 grandchildren. She is a Psychologist, forensic    ADVANCED DIRECTIVES:    HEALTH  MAINTENANCE: History  Substance Use Topics  . Smoking status: Former Smoker    Quit date: 12/10/1990  . Smokeless tobacco: Not on file  . Alcohol Use: No     Colonoscopy:  PAP:  Bone density:  Lipid panel:  Allergies  Allergen Reactions  . Codeine Nausea And Vomiting    Current Outpatient Prescriptions  Medication Sig Dispense Refill  . celecoxib (CELEBREX) 200 MG capsule Take 200 mg by mouth as needed.      . hyaluronate sodium (RADIAPLEXRX) GEL Apply 1 application topically 2 (two) times daily. Apply to rt breast after rad tx and in am after shower, daily and on weekends      . naproxen sodium (ANAPROX) 220 MG tablet Take 220 mg by mouth 2 (two) times daily with a meal. For pain.      . non-metallic deodorant Jethro Poling) MISC Apply 1 application topically daily.       No current facility-administered medications for this visit.    OBJECTIVE: Middle-aged [place] American woman in no acute distress Filed Vitals:   04/29/14 1153  BP: 133/71  Pulse: 82  Temp: 98.1 F (36.7 C)  Resp: 18     Body mass index is 28.57 kg/(m^2).    ECOG FS:1 - Symptomatic but completely ambulatory  Ocular: Sclerae unicteric, pupils equal, round and reactive to light Ear-nose-throat: Oropharynx clear and moist Lymphatic: No cervical or supraclavicular adenopathy Lungs no rales or rhonchi, good excursion bilaterally Heart regular rate and rhythm, no murmur appreciated Abd soft, nontender, positive bowel sounds MSK no focal spinal tenderness, no upper extremity lymphedema Neuro: non-focal, well-oriented, positive affect Breasts: The right breast is status post lumpectomy and radiation. There is hyperpigmentation over the radiation port. There is dry desquamation in the inframammary fold. There are no masses palpated and the right axilla is benign. The left breast is unremarkable   LAB RESULTS:  CMP     Component Value Date/Time   NA 143 04/29/2014 1143   NA 142 12/11/2013 1400   K 3.8  04/29/2014 1143   K 4.6 12/11/2013 1400   CL 103 12/11/2013 1400   CO2 28 04/29/2014 1143   CO2 27 12/11/2013 1400   GLUCOSE 113 04/29/2014 1143   GLUCOSE 91 12/11/2013 1400   BUN 14.1 04/29/2014 1143   BUN 23 12/11/2013 1400   CREATININE 1.0 04/29/2014 1143   CREATININE 0.93 12/11/2013 1400   CALCIUM 9.8 04/29/2014 1143   CALCIUM 9.8 12/11/2013 1400   PROT 7.1 04/29/2014 1143   PROT 7.9 12/11/2013 1400   ALBUMIN 3.9 04/29/2014 1143   ALBUMIN 4.3 12/11/2013 1400   AST 20 04/29/2014 1143   AST 20 12/11/2013 1400   ALT 18 04/29/2014 1143   ALT 15 12/11/2013 1400   ALKPHOS 75 04/29/2014 1143   ALKPHOS 94 12/11/2013 1400   BILITOT 0.54 04/29/2014 1143   BILITOT 0.4 12/11/2013 1400   GFRNONAA 64* 12/11/2013 1400   GFRAA 74* 12/11/2013 1400    I No results found for this basename: SPEP, UPEP,  kappa and lambda light chains  Lab Results  Component Value Date   WBC 4.0 04/29/2014   NEUTROABS 2.8 04/29/2014   HGB 12.7 04/29/2014   HCT 39.4 04/29/2014   MCV 81.8 04/29/2014   PLT 153 04/29/2014      Chemistry      Component Value Date/Time   NA 143 04/29/2014 1143   NA 142 12/11/2013 1400   K 3.8 04/29/2014 1143   K 4.6 12/11/2013 1400   CL 103 12/11/2013 1400   CO2 28 04/29/2014 1143   CO2 27 12/11/2013 1400   BUN 14.1 04/29/2014 1143   BUN 23 12/11/2013 1400   CREATININE 1.0 04/29/2014 1143   CREATININE 0.93 12/11/2013 1400      Component Value Date/Time   CALCIUM 9.8 04/29/2014 1143   CALCIUM 9.8 12/11/2013 1400   ALKPHOS 75 04/29/2014 1143   ALKPHOS 94 12/11/2013 1400   AST 20 04/29/2014 1143   AST 20 12/11/2013 1400   ALT 18 04/29/2014 1143   ALT 15 12/11/2013 1400   BILITOT 0.54 04/29/2014 1143   BILITOT 0.4 12/11/2013 1400       No results found for this basename: LABCA2    No components found with this basename: LABCA125    No results found for this basename: INR,  in the last 168 hours  Urinalysis    Component Value Date/Time   COLORURINE YELLOW 12/11/2013 [place]  12/11/2013 1331   LABSPEC 1.029 12/11/2013 1331   PHURINE 6.5 12/11/2013 1331   GLUCOSEU NEGATIVE 12/11/2013 1331   HGBUR NEGATIVE 12/11/2013 1331   BILIRUBINUR NEGATIVE 12/11/2013 1331   KETONESUR NEGATIVE 12/11/2013 1331   PROTEINUR NEGATIVE 12/11/2013 1331   UROBILINOGEN 1.0 12/11/2013 1331   NITRITE NEGATIVE 12/11/2013 1331   LEUKOCYTESUR TRACE* 12/11/2013 1331    STUDIES: No results found.  ASSESSMENT: 63 y.o. [place] woman Status post right upper outer quadrant breast biopsy to 12/06/2013 for an atypical papillary lesion.  (1) right lumpectomy 12-2013 showed an intracystic papillary carcinoma measuring 5.2 cm, grade 1, 100% estrogen and 100% progesterone receptor positive, with an MIB-1 of 8% and no HER-2 amplification.  (2) status post right axillary lymph node sampling for 15 2015, both sentinel lymph nodes being clear.  (3) Oncotype score of 0 predicts a 10 years risk of outside the breast recurrence of 3% if the patient's only systemic therapy is tamoxifen for 5 years  (4) completed adjuvant radiation 04/15/2014.  (5) To start tamoxifen 05/17/2014.  PLAN: I reviewed Xitl[place] situation in detail with her today. She understands that she has a very good prognosis and that her risk of this cancer coming back will be minimal if she can tolerate an antiestrogen for 5 years. We discussed anastrozole and tamoxifen and we are going to start with tamoxifen and see how she tolerates that.  She has a good understanding of the possible toxicities, side effects and complications of this agent including blood clots and endometrial carcinoma. She took birth controls pills remotely with no clotting issues so a risk of blood clots is very low. She will let us know if she develops any vaginal bleeding of course.  Otherwise she will return to see me in November. If she is tolerating tamoxifen well I will start seeing her on an every 6 month basis for the next 2 years and daily until she completes her 5  years of followup.  The patient has a good understanding of the overall plan. She agrees with it. She knows the goal of  treatment in her case is cure. She will call with any problems that may develop before her next visit here.  Chauncey Cruel, MD   04/29/2014 12:21 PM

## 2014-04-30 ENCOUNTER — Ambulatory Visit: Payer: BC Managed Care – PPO

## 2014-05-01 ENCOUNTER — Ambulatory Visit: Payer: BC Managed Care – PPO

## 2014-05-02 ENCOUNTER — Ambulatory Visit: Payer: BC Managed Care – PPO

## 2014-05-05 ENCOUNTER — Ambulatory Visit: Payer: BC Managed Care – PPO

## 2014-05-06 ENCOUNTER — Ambulatory Visit: Payer: BC Managed Care – PPO

## 2014-05-23 ENCOUNTER — Ambulatory Visit: Payer: Self-pay | Admitting: Radiation Oncology

## 2014-05-28 ENCOUNTER — Encounter: Payer: Self-pay | Admitting: Radiation Oncology

## 2014-05-28 NOTE — Progress Notes (Signed)
Reminded pt of her appt w/Dr. Pablo Ledger tomorrow at 1pm. Pt agreeable.

## 2014-05-29 ENCOUNTER — Ambulatory Visit
Admission: RE | Admit: 2014-05-29 | Discharge: 2014-05-29 | Disposition: A | Discharge status: Home / Self Care | Payer: BC Managed Care – PPO | Source: Ambulatory Visit | Attending: Radiation Oncology | Admitting: Radiation Oncology

## 2014-05-29 VITALS — BP 121/78 | HR 78 | Temp 98.7°F | Resp 20 | Wt 167.9 lb

## 2014-05-29 DIAGNOSIS — C50311 Malignant neoplasm of lower-inner quadrant of right female breast: Secondary | ICD-10-CM

## 2014-05-29 NOTE — Progress Notes (Signed)
Patient states her right breast occasionally has shooting pains and is sore, especially her right nipple. Pt states she is applying Radiaplex to right breast, and her skin is lightening, denies desquamation. She is not fatigued. She takes Tamoxifen daily.

## 2014-05-29 NOTE — Progress Notes (Signed)
   Department of Radiation Oncology  Phone:  [phone] Fax:        [phone]   Name: ARIANN KHAIMOV MRN: [id number]  DOB: Aug 26, 1951  Date: 05/29/2014  Follow Up Visit Note  Diagnosis: T3N0 Papillary Carcinoma   Summary and Interval since last radiation: 50 Gy completed 04/17/14  Interval History: [name] [name] presents today for routine followup.  She has done well. She enjoyed her time in [place]. She has started tamoxifen and is tolerating that well. She has no other complaints. She is pleased with how her skin is healing. She is back to work. She is using radiaplex. She has occasional sharp pain over her right breast especially towards the nipple.    Allergies:  Allergies  Allergen Reactions  . Codeine Nausea And Vomiting    Medications:  Current Outpatient Prescriptions  Medication Sig Dispense Refill  . hyaluronate sodium (RADIAPLEXRX) GEL Apply 1 application topically 2 (two) times daily. Apply to rt breast after rad tx and in am after shower, daily and on weekends      . tamoxifen (NOLVADEX) 20 MG tablet Take 1 tablet (20 mg total) by mouth daily.  90 tablet  12  . celecoxib (CELEBREX) 200 MG capsule Take 200 mg by mouth as needed.       No current facility-administered medications for this encounter.    Physical Exam:  Filed Vitals:   05/29/14 1332  BP: 121/78  Pulse: 78  Temp: 98.7 F (37.1 C)  TempSrc: Oral  Resp: 20  Weight: 167 lb 14.4 oz (76.159 kg)   Tanning over inferior and lateral aspect of right breast.   IMPRESSION: [name] [name] is a 63 y.o. female s/p breast conservation with resolving acute effects of treatment   PLAN:  She is doing well. We discussed the need for follow up every 4-6 months which she has scheduled.  We discussed the need for yearly mammograms which she can schedule with her OBGYN or with medical oncology. We discussed the need for sun protection in the treated area.  She can always call me with questions.  I will follow up with her on an as  needed basis.     Thea Silversmith, MD

## 2014-06-13 NOTE — Progress Notes (Signed)
  Radiation Oncology         [phone]) [phone] ________________________________  Name: [name] [name] MRN: [id number]  Date: 04/17/2014  DOB: [birth year]  End of Treatment Note  Diagnosis:   T3N0 Papillary Carcinoma     Indication for treatment:  Curative       Radiation treatment dates:   03/17/2014-04/17/2014  Site/dose:   Right breast/ 50 Gy in 25 fractions at 2 Gy per fraction (last 2 treatments given BID)  Beams/energy:   Forward planned tangents with electronic compensation and 6 MV photons   Narrative: The patient tolerated radiation treatment relatively well.   She had minimal skin darkening and no pain.   Plan: The patient has completed radiation treatment. The patient will return to radiation oncology clinic for routine followup in one month. I advised them to call or return sooner if they have any questions or concerns related to their recovery or treatment.  ------------------------------------------------  Thea Silversmith, MD

## 2014-07-24 ENCOUNTER — Ambulatory Visit (INDEPENDENT_AMBULATORY_CARE_PROVIDER_SITE_OTHER): Payer: BC Managed Care – PPO | Admitting: General Surgery

## 2015-07-26 ENCOUNTER — Other Ambulatory Visit: Payer: Self-pay | Admitting: Oncology

## 2015-07-27 ENCOUNTER — Other Ambulatory Visit: Payer: Self-pay

## 2015-07-27 ENCOUNTER — Telehealth: Payer: Self-pay | Admitting: Oncology

## 2015-07-27 DIAGNOSIS — C50311 Malignant neoplasm of lower-inner quadrant of right female breast: Secondary | ICD-10-CM

## 2015-07-27 MED ORDER — TAMOXIFEN CITRATE 20 MG PO TABS: ORAL_TABLET | ORAL | Status: DC

## 2015-07-27 NOTE — Telephone Encounter (Signed)
Pt called requesting a refill be sent to [facility]. She did not specify which medication. She was supposed to see Dr Jana Hakim last November. She will need to call us back with which medication she is talking about and a POF sent to scheduler.

## 2015-07-27 NOTE — Progress Notes (Signed)
Patient was due to see Dr. Jana Hakim in July 2016.  She missed that appt and is now scheduled for an appt on 09/08/15.  She will be out of tamoxifen in one week.  Sent over a 60 day supply to her pharmacy.  Patient aware of appt and knows it is important for her to make that appt. It will be a year and a half since her last appt.

## 2015-07-27 NOTE — Telephone Encounter (Signed)
Pt cell/home phone number is not a working number.

## 2015-07-27 NOTE — Telephone Encounter (Signed)
Mailed schedule

## 2015-07-28 ENCOUNTER — Telehealth: Payer: Self-pay | Admitting: Oncology

## 2015-07-28 NOTE — Telephone Encounter (Signed)
Called patient with added lab appointment and her phone is a non working number,a calendar and letter has been mailed to pateint

## 2015-09-07 ENCOUNTER — Other Ambulatory Visit: Payer: Self-pay | Admitting: *Deleted

## 2015-09-07 DIAGNOSIS — C50311 Malignant neoplasm of lower-inner quadrant of right female breast: Secondary | ICD-10-CM

## 2015-09-08 ENCOUNTER — Ambulatory Visit (HOSPITAL_BASED_OUTPATIENT_CLINIC_OR_DEPARTMENT_OTHER): Payer: Managed Care, Other (non HMO) | Admitting: Oncology

## 2015-09-08 ENCOUNTER — Other Ambulatory Visit (HOSPITAL_BASED_OUTPATIENT_CLINIC_OR_DEPARTMENT_OTHER): Payer: Managed Care, Other (non HMO)

## 2015-09-08 VITALS — BP 119/55 | HR 75 | Temp 98.4°F | Resp 18 | Ht 64.0 in | Wt 167.8 lb

## 2015-09-08 DIAGNOSIS — C50311 Malignant neoplasm of lower-inner quadrant of right female breast: Secondary | ICD-10-CM

## 2015-09-08 LAB — CBC WITH DIFFERENTIAL/PLATELET
BASO%: 0.4 % (ref 0.0–2.0)
Basophils Absolute: 0 10*3/uL (ref 0.0–0.1)
EOS%: 1.7 % (ref 0.0–7.0)
Eosinophils Absolute: 0.1 10*3/uL (ref 0.0–0.5)
HCT: 38.4 % (ref 34.8–46.6)
HGB: 12.7 g/dL (ref 11.6–15.9)
LYMPH%: 26.9 % (ref 14.0–49.7)
MCH: 27.4 pg (ref 25.1–34.0)
MCHC: 33.1 g/dL (ref 31.5–36.0)
MCV: 82.8 fL (ref 79.5–101.0)
MONO#: 0.3 10*3/uL (ref 0.1–0.9)
MONO%: 6.9 % (ref 0.0–14.0)
NEUT%: 64.1 % (ref 38.4–76.8)
NEUTROS ABS: 3 10*3/uL (ref 1.5–6.5)
Platelets: 138 10*3/uL — ABNORMAL LOW (ref 145–400)
RBC: 4.64 10*6/uL (ref 3.70–5.45)
RDW: 13.4 % (ref 11.2–14.5)
WBC: 4.6 10*3/uL (ref 3.9–10.3)
lymph#: 1.2 10*3/uL (ref 0.9–3.3)

## 2015-09-08 LAB — COMPREHENSIVE METABOLIC PANEL (CC13)
ALT: 14 U/L (ref 0–55)
ANION GAP: 9 meq/L (ref 3–11)
AST: 18 U/L (ref 5–34)
Albumin: 3.8 g/dL (ref 3.5–5.0)
Alkaline Phosphatase: 56 U/L (ref 40–150)
BILIRUBIN TOTAL: 0.56 mg/dL (ref 0.20–1.20)
BUN: 18.6 mg/dL (ref 7.0–26.0)
CO2: 24 meq/L (ref 22–29)
CREATININE: 0.9 mg/dL (ref 0.6–1.1)
Calcium: 9.4 mg/dL (ref 8.4–10.4)
Chloride: 109 mEq/L (ref 98–109)
EGFR: 82 mL/min/{1.73_m2} — ABNORMAL LOW (ref >90)
GLUCOSE: 90 mg/dL (ref 70–140)
Potassium: 4.2 mEq/L (ref 3.5–5.1)
SODIUM: 142 meq/L (ref 136–145)
TOTAL PROTEIN: 6.7 g/dL (ref 6.4–8.3)

## 2015-09-08 NOTE — Progress Notes (Signed)
[facility]  Telephone:(336) [phone] Fax:(336) [phone]     ID: NEL STONEKING DOB: 1951/01/18  MR#: [id number]  JME#:[id number]  Patient Care Team: Dorena Dew, FNP as PCP - General (Family Medicine) Chauncey Cruel, MD as Consulting Physician (Oncology) PCP: Dorena Dew, FNP GYN: Gracy Racer MD SU: Fanny Skates MD OTHER MD: Thea Silversmith MD  CHIEF COMPLAINT: Estrogen receptor positive breast cancer  CURRENT TREATMENT: Tamoxifen   BREAST CANCER HISTORY: From doctor Dana Allan 01/28/2014 intake nodes:   "[name] [name] is a 64 y.o. female. Has not had a mammogram in about 12 years. Approximately in December patient felt a mass in the lateral right breast. She does tell me that she has occasionally had some pain. She had mammogram performed that showed a 6 cm irregular mass in the upper outer quadrant of the right breast. Ultrasound of the right axilla was normal. The left breast showed a benign lymph node at the 3:00 position. Patient underwent a biopsy of the right breast mass that showed atypical papillary lesion. Because of this she was seen by Dr. Fanny Skates. He recommended a lumpectomy and removal of this lesion. Patient went on to have a lumpectomy performed on 11/27/2013. 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 Margins were negative. Tumor was ER +100% PR +100% HER-2/neu negative with a proliferation marker Ki-67 8%. She went on to have MRI of the breasts performed that were negative. Patient's case was discussed at the multidisciplinary breast conference. She was recommended further workup including doing sentinel lymph node."  Her subsequent history is as detailed below.  INTERVAL HISTORY: [name] [name] returns today for follow-up of her estrogen receptor positive breast cancer. She started tamoxifen 05/18/2015. She is tolerating it remarkably well. She has not had problems with hot flashes.  She has minimal to no vaginal discharge. She was able to obtain the drug 4 $25/three-month supply  REVIEW OF SYSTEMS: She has minimal soreness over the surgical breast at times but this is inconstant. Rarely she has "shooting pains". She understands these are both common types of post operative discomfort and do not indicate breast cancer recurrence. A detailed review of systems today was otherwise stable.  PAST MEDICAL HISTORY: Past Medical History  Diagnosis Date  . Arthritis     right knee  . Wears glasses   . Full dentures   . Radiation     Left Breast    PAST SURGICAL HISTORY: Past Surgical History  Procedure Laterality Date  . Appendectomy    . Tubal ligation    . Breast lumpectomy Right 12/16/2013    Procedure: LUMPECTOMY;  Surgeon: Adin Hector, MD;  Location: [facility];  Service: General;  Laterality: Right;  . Axillary sentinel node biopsy Right 01/29/2014    Procedure: RIGHT AXILLARY SENTINEL LYMPHNODE BIOPSY;  Surgeon: Adin Hector, MD;  Location: [facility];  Service: General;  Laterality: Right;    FAMILY HISTORY Family History  Problem Relation Age of Onset  . Hypertension Sister   . Hypertension Brother    the patient's father died at the age of 47 from cancer, but the patient does not know the primary site. The patient's mother died "of natural causes" at age 57. The patient has 2 brothers, one half-sister. There is no cancer history in the family to her knowledge  GYNECOLOGIC HISTORY:  No LMP recorded. Patient is postmenopausal. Menarche age 45, first live birth age 89, the patient  is GX P2. She went through the change of life around age 50. She did not take hormone replacement.  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 At home she lives with her sister [name] [name] . The patient's children are [name] [name] who works as a Runner, broadcasting/film/video in [place], and [name] [name],,  works at urgent care here in town. The patient has 5 grandchildren. She is a Psychologist, forensic    ADVANCED DIRECTIVES: Not in place   HEALTH MAINTENANCE: Social History  Substance Use Topics  . Smoking status: Former Smoker    Quit date: 12/10/1990  . Smokeless tobacco: Not on file  . Alcohol Use: No     Colonoscopy: 2016?  PAP: Per Dr. Ruthann Cancer  Bone density:  Lipid panel:  Allergies  Allergen Reactions  . Codeine Nausea And Vomiting    Current Outpatient Prescriptions  Medication Sig Dispense Refill  . celecoxib (CELEBREX) 200 MG capsule Take 200 mg by mouth as needed.    . naproxen sodium (ALEVE) 220 MG tablet Take 1 tablet (220 mg total) by mouth 2 (two) times daily with a meal.    . tamoxifen (NOLVADEX) 20 MG tablet TAKE 1 TABLET (20 MG TOTAL) BY MOUTH DAILY. 60 tablet 0   No current facility-administered medications for this visit.    OBJECTIVE: Middle-aged [place] American woman who appears well Filed Vitals:   09/08/15 0908  BP: 119/55  Pulse: 75  Temp: 98.4 F (36.9 C)  Resp: 18     Body mass index is 28.79 kg/(m^2).    ECOG FS:0 - Asymptomatic  Sclerae unicteric, pupils round and equal Oropharynx clear and moist-- no thrush or other lesions No cervical or supraclavicular adenopathy Lungs no rales or rhonchi Heart regular rate and rhythm Abd soft, nontender, positive bowel sounds MSK no focal spinal tenderness, no upper extremity lymphedema Neuro: nonfocal, well oriented, appropriate affect Breasts: The right breast is status post lumpectomy and radiation. There is hyperpigmentation and some skin thickening, but no findings to suggest recurrent or residual disease. The right axilla is benign per the left breast is unremarkable.  LAB RESULTS:  CMP     Component Value Date/Time   NA 143 04/29/2014 1143   NA 142 12/11/2013 1400   K 3.8 04/29/2014 1143   K 4.6 12/11/2013 1400   CL 103 12/11/2013 1400   CO2 28 04/29/2014 1143   CO2 27 12/11/2013 1400    GLUCOSE 113 04/29/2014 1143   GLUCOSE 91 12/11/2013 1400   BUN 14.1 04/29/2014 1143   BUN 23 12/11/2013 1400   CREATININE 1.0 04/29/2014 1143   CREATININE 0.93 12/11/2013 1400   CALCIUM 9.8 04/29/2014 1143   CALCIUM 9.8 12/11/2013 1400   PROT 7.1 04/29/2014 1143   PROT 7.9 12/11/2013 1400   ALBUMIN 3.9 04/29/2014 1143   ALBUMIN 4.3 12/11/2013 1400   AST 20 04/29/2014 1143   AST 20 12/11/2013 1400   ALT 18 04/29/2014 1143   ALT 15 12/11/2013 1400   ALKPHOS 75 04/29/2014 1143   ALKPHOS 94 12/11/2013 1400   BILITOT 0.54 04/29/2014 1143   BILITOT 0.4 12/11/2013 1400   GFRNONAA 64* 12/11/2013 1400   GFRAA 74* 12/11/2013 1400    I No results found for: SPEP  Lab Results  Component Value Date   WBC 4.6 09/08/2015   NEUTROABS 3.0 09/08/2015   HGB 12.7 09/08/2015   HCT 38.4 09/08/2015   MCV 82.8 09/08/2015   PLT 138* 09/08/2015  Chemistry      Component Value Date/Time   NA 143 04/29/2014 1143   NA 142 12/11/2013 1400   K 3.8 04/29/2014 1143   K 4.6 12/11/2013 1400   CL 103 12/11/2013 1400   CO2 28 04/29/2014 1143   CO2 27 12/11/2013 1400   BUN 14.1 04/29/2014 1143   BUN 23 12/11/2013 1400   CREATININE 1.0 04/29/2014 1143   CREATININE 0.93 12/11/2013 1400      Component Value Date/Time   CALCIUM 9.8 04/29/2014 1143   CALCIUM 9.8 12/11/2013 1400   ALKPHOS 75 04/29/2014 1143   ALKPHOS 94 12/11/2013 1400   AST 20 04/29/2014 1143   AST 20 12/11/2013 1400   ALT 18 04/29/2014 1143   ALT 15 12/11/2013 1400   BILITOT 0.54 04/29/2014 1143   BILITOT 0.4 12/11/2013 1400       No results found for: LABCA2  No components found for: LABCA125  No results for input(s): INR in the last 168 hours.  Urinalysis    Component Value Date/Time   COLORURINE YELLOW 12/11/2013 1331   APPEARANCEUR CLEAR 12/11/2013 1331   LABSPEC 1.029 12/11/2013 1331   PHURINE 6.5 12/11/2013 1331   GLUCOSEU NEGATIVE 12/11/2013 1331   HGBUR NEGATIVE 12/11/2013 1331   BILIRUBINUR  NEGATIVE 12/11/2013 1331   KETONESUR NEGATIVE 12/11/2013 1331   PROTEINUR NEGATIVE 12/11/2013 1331   UROBILINOGEN 1.0 12/11/2013 1331   NITRITE NEGATIVE 12/11/2013 1331   LEUKOCYTESUR TRACE* 12/11/2013 1331    STUDIES: No results found.   ASSESSMENT: 64 y.o. [place] woman Status post right upper outer quadrant breast biopsy to 12/06/2013 for an atypical papillary lesion.  (1) right lumpectomy 12-2013 showed an intracystic papillary carcinoma measuring 5.2 cm, grade 1, 100% estrogen and 100% progesterone receptor positive, with an MIB-1 of 8% and no HER-2 amplification.  (2) status post right axillary lymph node sampling 01/29/2014, both sentinel lymph nodes being clear.  (3) Oncotype score of 0 predicts a 10 years risk of outside the breast recurrence of 3% if the patient's only systemic therapy is tamoxifen for 5 years  (4) adjuvant radiation 03/17/2014-04/17/2014: Right breast/ 50 Gy in 25 fractions at 2 Gy per fraction (last 2 treatments given BID)   (5) started tamoxifen 05/17/2014.  PLAN: Jacquelyne is tolerating tamoxifen with no significant side effects. She is obtaining it at a very good price. Giving her a very good prognosis the plan will be for tamoxifen 5 years and then "graduation".  If she sees her surgeon Dr. Dalbert Batman in February, she can see [place] next in May. We can continue to "team her" in that fashion for a year or 2 and then broaden the follow-up interval  She knows to call for any problems that may develop before her next visit here. Chauncey Cruel, MD   09/08/2015 9:30 AM

## 2015-09-09 ENCOUNTER — Telehealth: Payer: Self-pay | Admitting: Oncology

## 2015-09-09 NOTE — Telephone Encounter (Signed)
s.w. pt and advised on May appt....pt ok and aware of dt

## 2016-03-07 ENCOUNTER — Other Ambulatory Visit: Payer: Self-pay

## 2016-03-07 DIAGNOSIS — C50311 Malignant neoplasm of lower-inner quadrant of right female breast: Secondary | ICD-10-CM

## 2016-03-08 ENCOUNTER — Other Ambulatory Visit (HOSPITAL_BASED_OUTPATIENT_CLINIC_OR_DEPARTMENT_OTHER): Payer: Managed Care, Other (non HMO)

## 2016-03-08 ENCOUNTER — Ambulatory Visit (HOSPITAL_BASED_OUTPATIENT_CLINIC_OR_DEPARTMENT_OTHER): Payer: Managed Care, Other (non HMO) | Admitting: Nurse Practitioner

## 2016-03-08 ENCOUNTER — Encounter: Payer: Self-pay | Admitting: Nurse Practitioner

## 2016-03-08 VITALS — BP 130/66 | HR 75 | Temp 98.4°F | Resp 18 | Ht 64.0 in | Wt 173.1 lb

## 2016-03-08 DIAGNOSIS — C50311 Malignant neoplasm of lower-inner quadrant of right female breast: Secondary | ICD-10-CM | POA: Diagnosis not present

## 2016-03-08 DIAGNOSIS — M25562 Pain in left knee: Secondary | ICD-10-CM

## 2016-03-08 LAB — COMPREHENSIVE METABOLIC PANEL
ALBUMIN: 3.5 g/dL (ref 3.5–5.0)
ALT: 11 U/L (ref 0–55)
ANION GAP: 7 meq/L (ref 3–11)
AST: 15 U/L (ref 5–34)
Alkaline Phosphatase: 48 U/L (ref 40–150)
BUN: 17.8 mg/dL (ref 7.0–26.0)
CHLORIDE: 111 meq/L — AB (ref 98–109)
CO2: 23 mEq/L (ref 22–29)
CREATININE: 0.9 mg/dL (ref 0.6–1.1)
Calcium: 9.2 mg/dL (ref 8.4–10.4)
EGFR: 75 mL/min/{1.73_m2} — ABNORMAL LOW (ref >90)
Glucose: 91 mg/dl (ref 70–140)
POTASSIUM: 4.5 meq/L (ref 3.5–5.1)
Sodium: 141 mEq/L (ref 136–145)
Total Bilirubin: 0.46 mg/dL (ref 0.20–1.20)
Total Protein: 6.4 g/dL (ref 6.4–8.3)

## 2016-03-08 LAB — CBC WITH DIFFERENTIAL/PLATELET
BASO%: 0.6 % (ref 0.0–2.0)
Basophils Absolute: 0 10*3/uL (ref 0.0–0.1)
EOS%: 1.9 % (ref 0.0–7.0)
Eosinophils Absolute: 0.1 10*3/uL (ref 0.0–0.5)
HEMATOCRIT: 35.6 % (ref 34.8–46.6)
HEMOGLOBIN: 11.6 g/dL (ref 11.6–15.9)
LYMPH#: 1.3 10*3/uL (ref 0.9–3.3)
LYMPH%: 28.4 % (ref 14.0–49.7)
MCH: 26.5 pg (ref 25.1–34.0)
MCHC: 32.6 g/dL (ref 31.5–36.0)
MCV: 81.3 fL (ref 79.5–101.0)
MONO#: 0.4 10*3/uL (ref 0.1–0.9)
MONO%: 8.3 % (ref 0.0–14.0)
NEUT#: 2.8 10*3/uL (ref 1.5–6.5)
NEUT%: 60.8 % (ref 38.4–76.8)
PLATELETS: 150 10*3/uL (ref 145–400)
RBC: 4.38 10*6/uL (ref 3.70–5.45)
RDW: 13.4 % (ref 11.2–14.5)
WBC: 4.7 10*3/uL (ref 3.9–10.3)

## 2016-03-08 NOTE — Progress Notes (Signed)
[place]  Telephone:(336) [phone] Fax:(336) [phone]   ID: [name] [name] DOB: 1951/06/03  MR#: [id number]  CVK#:[id number]  Patient Care Team: Dorena Dew, FNP as PCP - General (Family Medicine) Chauncey Cruel, MD as Consulting Physician (Oncology) PCP: Dorena Dew, FNP GYN: Gracy Racer MD SU: Fanny Skates MD OTHER MD: Thea Silversmith MD  CHIEF COMPLAINT: Estrogen receptor positive breast cancer  CURRENT TREATMENT: Tamoxifen   BREAST CANCER HISTORY: From doctor Dana Allan 01/28/2014 intake nodes:   "[name] [name] is a 65 y.o. female. Has not had a mammogram in about 12 years. Approximately in December patient felt a mass in the lateral right breast. She does tell me that she has occasionally had some pain. She had mammogram performed that showed a 6 cm irregular mass in the upper outer quadrant of the right breast. Ultrasound of the right axilla was normal. The left breast showed a benign lymph node at the 3:00 position. Patient underwent a biopsy of the right breast mass that showed atypical papillary lesion. Because of this she was seen by Dr. Fanny Skates. He recommended a lumpectomy and removal of this lesion. Patient went on to have a lumpectomy performed on 11/27/2013. 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 Margins were negative. Tumor was ER +100% PR +100% HER-2/neu negative with a proliferation marker Ki-67 8%. She went on to have MRI of the breasts performed that were negative. Patient's case was discussed at the multidisciplinary breast conference. She was recommended further workup including doing sentinel lymph node."  Her subsequent history is as detailed below.  INTERVAL HISTORY: [name] [name] returns today for follow-up of her estrogen receptor positive breast cancer. She has been on tamoxifen since August 2016. She is tolerating this drug well with rare hot flashes and mild vaginal  dryness. The interval history is generally unremarkable.  REVIEW OF SYSTEMS: [name] [name]'s main complaint today is chronic left knee pain that has grown worse over the past 2 months. She has visited urgent care who prescribed her celebrex. This is somewhat helpful. Some days she takes 1 naproxen tablet. She has occasional shooting pains to the right breast. She denies fevers, chills, nausea, vomiting, or changes in bowel or bladder habits. She denies shortness of breath, chest pain, cough, or palpitations. She has no unusual headaches, dizziness, or vision changes. She has good energy during the day, and sleeps well at night. A detailed review of systems is otherwise stable.  PAST MEDICAL HISTORY: Past Medical History  Diagnosis Date  . Arthritis     right knee  . Wears glasses   . Full dentures   . Radiation     Left Breast    PAST SURGICAL HISTORY: Past Surgical History  Procedure Laterality Date  . Appendectomy    . Tubal ligation    . Breast lumpectomy Right 12/16/2013    Procedure: LUMPECTOMY;  Surgeon: Adin Hector, MD;  Location: [facility];  Service: General;  Laterality: Right;  . Axillary sentinel node biopsy Right 01/29/2014    Procedure: RIGHT AXILLARY SENTINEL LYMPHNODE BIOPSY;  Surgeon: Adin Hector, MD;  Location: [facility];  Service: General;  Laterality: Right;    FAMILY HISTORY Family History  Problem Relation Age of Onset  . Hypertension Sister   . Hypertension Brother    the patient's father died at the age of 69 from cancer, but the patient does not know the primary site. The patient's mother died "  of natural causes" at age 39. The patient has 2 brothers, one half-sister. There is no cancer history in the family to her knowledge  GYNECOLOGIC HISTORY:  No LMP recorded. Patient is postmenopausal. Menarche age 73, first live birth age 23, the patient is [place] P2. She went through the change of life around age 77. She did not take  hormone replacement.  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 At home she lives with her sister [name] [name] . The patient's children are [name] [name] who works as a Runner, broadcasting/film/video in [place], and [name] [name],, works at urgent care here in town. The patient has 5 grandchildren. She is a Psychologist, forensic    ADVANCED DIRECTIVES: Not in place   HEALTH MAINTENANCE: Social History  Substance Use Topics  . Smoking status: Former Smoker    Quit date: 12/10/1990  . Smokeless tobacco: Not on file  . Alcohol Use: No     Colonoscopy: 2016?  PAP: Per Dr. Ruthann Cancer  Bone density:  Lipid panel:  Allergies  Allergen Reactions  . Codeine Nausea And Vomiting    Current Outpatient Prescriptions  Medication Sig Dispense Refill  . celecoxib (CELEBREX) 200 MG capsule Take 200 mg by mouth as needed.    . naproxen sodium (ALEVE) 220 MG tablet Take 1 tablet (220 mg total) by mouth 2 (two) times daily with a meal.    . tamoxifen (NOLVADEX) 20 MG tablet TAKE 1 TABLET (20 MG TOTAL) BY MOUTH DAILY. 60 tablet 0   No current facility-administered medications for this visit.    OBJECTIVE: Middle-aged [place] American woman who appears well Filed Vitals:   03/08/16 0933  BP: 130/66  Pulse: 75  Temp: 98.4 F (36.9 C)  Resp: 18     Body mass index is 29.7 kg/(m^2).    ECOG FS:0 - Asymptomatic  Skin: warm, dry  HEENT: sclerae anicteric, conjunctivae pink, oropharynx clear. No thrush or mucositis.  Lymph Nodes: No cervical or supraclavicular lymphadenopathy  Lungs: clear to auscultation bilaterally, no rales, wheezes, or rhonci  Heart: regular rate and rhythm  Abdomen: round, soft, non tender, positive bowel sounds  Musculoskeletal: No focal spinal tenderness, no peripheral edema  Neuro: non focal, well oriented, positive affect  Breasts: right breast status post lumpectomy and radiation. Residual hyperpigmentation noted. No evidence of recurrent  disease. Right axilla benign. Left breast unremarkable.   LAB RESULTS:  CMP     Component Value Date/Time   NA 142 09/08/2015 0842   NA 142 12/11/2013 1400   K 4.2 09/08/2015 0842   K 4.6 12/11/2013 1400   CL 103 12/11/2013 1400   CO2 24 09/08/2015 0842   CO2 27 12/11/2013 1400   GLUCOSE 90 09/08/2015 0842   GLUCOSE 91 12/11/2013 1400   BUN 18.6 09/08/2015 0842   BUN 23 12/11/2013 1400   CREATININE 0.9 09/08/2015 0842   CREATININE 0.93 12/11/2013 1400   CALCIUM 9.4 09/08/2015 0842   CALCIUM 9.8 12/11/2013 1400   PROT 6.7 09/08/2015 0842   PROT 7.9 12/11/2013 1400   ALBUMIN 3.8 09/08/2015 0842   ALBUMIN 4.3 12/11/2013 1400   AST 18 09/08/2015 0842   AST 20 12/11/2013 1400   ALT 14 09/08/2015 0842   ALT 15 12/11/2013 1400   ALKPHOS 56 09/08/2015 0842   ALKPHOS 94 12/11/2013 1400   BILITOT 0.56 09/08/2015 0842   BILITOT 0.4 12/11/2013 1400   GFRNONAA 64* 12/11/2013 1400   GFRAA 74* 12/11/2013 1400  I No results found for: SPEP  Lab Results  Component Value Date   WBC 4.7 03/08/2016   NEUTROABS 2.8 03/08/2016   HGB 11.6 03/08/2016   HCT 35.6 03/08/2016   MCV 81.3 03/08/2016   PLT 150 03/08/2016      Chemistry      Component Value Date/Time   NA 142 09/08/2015 0842   NA 142 12/11/2013 1400   K 4.2 09/08/2015 0842   K 4.6 12/11/2013 1400   CL 103 12/11/2013 1400   CO2 24 09/08/2015 0842   CO2 27 12/11/2013 1400   BUN 18.6 09/08/2015 0842   BUN 23 12/11/2013 1400   CREATININE 0.9 09/08/2015 0842   CREATININE 0.93 12/11/2013 1400      Component Value Date/Time   CALCIUM 9.4 09/08/2015 0842   CALCIUM 9.8 12/11/2013 1400   ALKPHOS 56 09/08/2015 0842   ALKPHOS 94 12/11/2013 1400   AST 18 09/08/2015 0842   AST 20 12/11/2013 1400   ALT 14 09/08/2015 0842   ALT 15 12/11/2013 1400   BILITOT 0.56 09/08/2015 0842   BILITOT 0.4 12/11/2013 1400       No results found for: LABCA2  No components found for: [id number]  No results for input(s): INR in the  last 168 hours.  Urinalysis    Component Value Date/Time   COLORURINE YELLOW 12/11/2013 1331   APPEARANCEUR CLEAR 12/11/2013 1331   LABSPEC 1.029 12/11/2013 1331   PHURINE 6.5 12/11/2013 1331   GLUCOSEU NEGATIVE 12/11/2013 1331   HGBUR NEGATIVE 12/11/2013 1331   BILIRUBINUR NEGATIVE 12/11/2013 1331   KETONESUR NEGATIVE 12/11/2013 1331   PROTEINUR NEGATIVE 12/11/2013 1331   UROBILINOGEN 1.0 12/11/2013 1331   NITRITE NEGATIVE 12/11/2013 1331   LEUKOCYTESUR TRACE* 12/11/2013 1331    STUDIES: No results found.   ASSESSMENT: 65 y.o. [place] woman Status post right upper outer quadrant breast biopsy to 12/06/2013 for an atypical papillary lesion.  (1) right lumpectomy 12-2013 showed an intracystic papillary carcinoma measuring 5.2 cm, grade 1, 100% estrogen and 100% progesterone receptor positive, with an MIB-1 of 8% and no HER-2 amplification.  (2) status post right axillary lymph node sampling 01/29/2014, both sentinel lymph nodes being clear.  (3) Oncotype score of 0 predicts a 10 years risk of outside the breast recurrence of 3% if the patient's only systemic therapy is tamoxifen for 5 years  (4) adjuvant radiation 03/17/2014-04/17/2014: Right breast/ 50 Gy in 25 fractions at 2 Gy per fraction (last 2 treatments given BID)   (5) started tamoxifen 05/17/2014.  PLAN: Brynnlee is doing well as far as her breast cancer is concerned. She is now 2 years out from her definitive surgery with no evidence of recurrent diease. She is tolerating the tamoxifen well and will continue this drug for at least 5 years of antiestrogen therapy. The labs were reviewed in detail and were stable.  She is overdue for her annual mammogram, so I [facility] placed [facility].   I have placed a referral to [facility] to evaluate and treat her left knee pain.   Heran will return in 6 months for follow up. She understands and agrees with this plan.  She knows the goal of treatment in her case is cure. She has been encouraged to call with any issues that might arise before her next visit here.    Laurie Panda, NP   03/08/2016 10:00 AM

## 2016-03-15 ENCOUNTER — Ambulatory Visit
Admission: RE | Admit: 2016-03-15 | Discharge: 2016-03-15 | Disposition: A | Discharge status: Home / Self Care | Payer: Managed Care, Other (non HMO) | Source: Ambulatory Visit | Attending: Nurse Practitioner | Admitting: Nurse Practitioner

## 2016-03-15 DIAGNOSIS — C50311 Malignant neoplasm of lower-inner quadrant of right female breast: Secondary | ICD-10-CM

## 2016-03-18 ENCOUNTER — Other Ambulatory Visit: Payer: Self-pay | Admitting: Nurse Practitioner

## 2016-07-13 ENCOUNTER — Encounter: Payer: Self-pay | Admitting: *Deleted

## 2016-07-22 ENCOUNTER — Other Ambulatory Visit: Payer: Self-pay | Admitting: *Deleted

## 2016-07-22 DIAGNOSIS — C50311 Malignant neoplasm of lower-inner quadrant of right female breast: Secondary | ICD-10-CM

## 2016-07-22 MED ORDER — TAMOXIFEN CITRATE 20 MG PO TABS
ORAL_TABLET | ORAL | 0 refills | Status: DC
Start: 2016-07-22 — End: 2016-09-07

## 2016-09-06 ENCOUNTER — Other Ambulatory Visit: Payer: Self-pay | Admitting: *Deleted

## 2016-09-06 DIAGNOSIS — C50311 Malignant neoplasm of lower-inner quadrant of right female breast: Secondary | ICD-10-CM

## 2016-09-07 ENCOUNTER — Other Ambulatory Visit (HOSPITAL_BASED_OUTPATIENT_CLINIC_OR_DEPARTMENT_OTHER): Payer: 59

## 2016-09-07 ENCOUNTER — Ambulatory Visit (HOSPITAL_BASED_OUTPATIENT_CLINIC_OR_DEPARTMENT_OTHER): Payer: 59 | Admitting: Oncology

## 2016-09-07 VITALS — BP 116/52 | HR 70 | Temp 98.1°F | Resp 18 | Ht 64.0 in | Wt 169.6 lb

## 2016-09-07 DIAGNOSIS — Z170 Estrogen receptor positive status [ER+]: Secondary | ICD-10-CM

## 2016-09-07 DIAGNOSIS — Z79810 Long term (current) use of selective estrogen receptor modulators (SERMs): Secondary | ICD-10-CM

## 2016-09-07 DIAGNOSIS — C50411 Malignant neoplasm of upper-outer quadrant of right female breast: Secondary | ICD-10-CM | POA: Diagnosis not present

## 2016-09-07 DIAGNOSIS — C50311 Malignant neoplasm of lower-inner quadrant of right female breast: Secondary | ICD-10-CM

## 2016-09-07 LAB — CBC WITH DIFFERENTIAL/PLATELET
BASO%: 0.4 % (ref 0.0–2.0)
Basophils Absolute: 0 10*3/uL (ref 0.0–0.1)
EOS ABS: 0 10*3/uL (ref 0.0–0.5)
EOS%: 0.8 % (ref 0.0–7.0)
HCT: 36.2 % (ref 34.8–46.6)
HGB: 11.9 g/dL (ref 11.6–15.9)
LYMPH#: 1.4 10*3/uL (ref 0.9–3.3)
LYMPH%: 27.2 % (ref 14.0–49.7)
MCH: 26.5 pg (ref 25.1–34.0)
MCHC: 32.9 g/dL (ref 31.5–36.0)
MCV: 80.6 fL (ref 79.5–101.0)
MONO#: 0.3 10*3/uL (ref 0.1–0.9)
MONO%: 6 % (ref 0.0–14.0)
NEUT#: 3.4 10*3/uL (ref 1.5–6.5)
NEUT%: 65.6 % (ref 38.4–76.8)
PLATELETS: 147 10*3/uL (ref 145–400)
RBC: 4.49 10*6/uL (ref 3.70–5.45)
RDW: 13.3 % (ref 11.2–14.5)
WBC: 5.2 10*3/uL (ref 3.9–10.3)

## 2016-09-07 LAB — COMPREHENSIVE METABOLIC PANEL
ALT: 14 U/L (ref 0–55)
ANION GAP: 9 meq/L (ref 3–11)
AST: 17 U/L (ref 5–34)
Albumin: 3.7 g/dL (ref 3.5–5.0)
Alkaline Phosphatase: 61 U/L (ref 40–150)
BUN: 18.6 mg/dL (ref 7.0–26.0)
CALCIUM: 9.6 mg/dL (ref 8.4–10.4)
CHLORIDE: 109 meq/L (ref 98–109)
CO2: 25 mEq/L (ref 22–29)
CREATININE: 0.9 mg/dL (ref 0.6–1.1)
EGFR: 82 mL/min/{1.73_m2} — AB (ref >90)
Glucose: 84 mg/dl (ref 70–140)
POTASSIUM: 3.5 meq/L (ref 3.5–5.1)
Sodium: 143 mEq/L (ref 136–145)
Total Bilirubin: 0.55 mg/dL (ref 0.20–1.20)
Total Protein: 7 g/dL (ref 6.4–8.3)

## 2016-09-07 MED ORDER — TAMOXIFEN CITRATE 20 MG PO TABS: ORAL_TABLET | ORAL | 0 refills | Status: DC

## 2016-09-07 NOTE — Progress Notes (Signed)
Lower density: [facility] [place]  Telephone:(336) [phone] [phone]   ID: [name] [name] DOB: July 17, 1951  MR#: [id number]  LKT#:[id number]  Patient Care Team: Dorena Dew, FNP as PCP - General (Family Medicine) Chauncey Cruel, MD as Consulting Physician (Oncology) PCP: Dorena Dew, FNP GYN: Gracy Racer MD SU: Fanny Skates MD OTHER MD: Thea Silversmith MD  CHIEF COMPLAINT: Estrogen receptor positive breast cancer  CURRENT TREATMENT: Tamoxifen   BREAST CANCER HISTORY: From doctor Dana Allan 01/28/2014 intake nodes:   "[name] [name] is a 65 y.o. female. Has not had a mammogram in about 12 years. Approximately in December patient felt a mass in the lateral right breast. She does tell me that she has occasionally had some pain. She had mammogram performed that showed a 6 cm irregular mass in the upper outer quadrant of the right breast. Ultrasound of the right axilla was normal. The left breast showed a benign lymph node at the 3:00 position. Patient underwent a biopsy of the right breast mass that showed atypical papillary lesion. Because of this she was seen by Dr. Fanny Skates. He recommended a lumpectomy and removal of this lesion. Patient went on to have a lumpectomy performed on 11/27/2013. 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 Margins were negative. Tumor was ER +100% PR +100% HER-2/neu negative with a proliferation marker Ki-67 8%. She went on to have MRI of the breasts performed that were negative. Patient's case was discussed at the multidisciplinary breast conference. She was recommended further workup including doing sentinel lymph node."  Her subsequent history is as detailed below.  INTERVAL HISTORY: [name] [name] returns today for follow-up of her estrogen receptor positive breast cancer. She continues on tamoxifen, with good tolerance. She doesn't have problems from hot flashes  or vaginal discharge. She obtains it currently free of charge  REVIEW OF SYSTEMS: [name] [name] continues to have problems with her knees. She has seen a chiropractor for this without much help. She is now taking naproxen/esomeprazole with some relief. She continues to work full time. She has some stress urinary incontinence symptoms. Overall though a detailed review of systems today was stable  PAST MEDICAL HISTORY: Past Medical History:  Diagnosis Date  . Arthritis    right knee  . Full dentures   . Radiation    Left Breast  . Wears glasses     PAST SURGICAL HISTORY: Past Surgical History:  Procedure Laterality Date  . APPENDECTOMY    . AXILLARY SENTINEL NODE BIOPSY Right 01/29/2014   Procedure: RIGHT AXILLARY SENTINEL LYMPHNODE BIOPSY;  Surgeon: Adin Hector, MD;  Location: [facility];  Service: General;  Laterality: Right;  . BREAST LUMPECTOMY Right 12/16/2013   Procedure: LUMPECTOMY;  Surgeon: Adin Hector, MD;  Location: [facility];  Service: General;  Laterality: Right;  . TUBAL LIGATION      FAMILY HISTORY Family History  Problem Relation Age of Onset  . Hypertension Sister   . Hypertension Brother    the patient's father died at the age of 69 from cancer, but the patient does not know the primary site. The patient's mother died "of natural causes" at age 62. The patient has 2 brothers, one half-sister. There is no cancer history in the family to her knowledge  GYNECOLOGIC HISTORY:  No LMP recorded. Patient is postmenopausal. Menarche age 25, first live birth age 62, the patient is [place] P2. She went through the change of life  around age 38. She did not take hormone replacement.  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 At home she lives with her sister [name] [name] . The patient's children are [name] [name] who works as a Runner, broadcasting/film/video in [place], and [name] [name],, works at urgent care  here in town. The patient has 5 grandchildren. She is a Psychologist, forensic    ADVANCED DIRECTIVES: Not in place   HEALTH MAINTENANCE: Social History  Substance Use Topics  . Smoking status: Former Smoker    Quit date: 12/10/1990  . Smokeless tobacco: Not on file  . Alcohol use No     Colonoscopy: 2016?  PAP: Per Dr. Ruthann Cancer  Bone density:  Lipid panel:  Allergies  Allergen Reactions  . Codeine Nausea And Vomiting    Current Outpatient Prescriptions  Medication Sig Dispense Refill  . Naproxen-Esomeprazole 500-20 MG TBEC Take by mouth. 60 tablet   . tamoxifen (NOLVADEX) 20 MG tablet TAKE 1 TABLET (20 MG TOTAL) BY MOUTH DAILY. 60 tablet 0   No current facility-administered medications for this visit.     OBJECTIVE: Middle-aged [place] American woman in no acute distress Vitals:   09/07/16 0956  BP: (!) 116/52  Pulse: 70  Resp: 18  Temp: 98.1 F (36.7 C)     Body mass index is 29.11 kg/m.    ECOG FS:1 - Symptomatic but completely ambulatory  Sclerae unicteric, pupils round and equal Oropharynx clear and moist-- no thrush or other lesions No cervical or supraclavicular adenopathy Lungs no rales or rhonchi Heart regular rate and rhythm Abd soft, nontender, positive bowel sounds MSK no focal spinal tenderness, no upper extremity lymphedema Neuro: nonfocal, well oriented, appropriate affect Breasts: the right breast is status post lumpectomy followed by radiation. There is no evidence of local recurrence. The right axilla is benign. Left breast is unremarkable.  LAB RESULTS:  CMP     Component Value Date/Time   NA 143 09/07/2016 0908   K 3.5 09/07/2016 0908   CL 103 12/11/2013 1400   CO2 25 09/07/2016 0908   GLUCOSE 84 09/07/2016 0908   BUN 18.6 09/07/2016 0908   CREATININE 0.9 09/07/2016 0908   CALCIUM 9.6 09/07/2016 0908   PROT 7.0 09/07/2016 0908   ALBUMIN 3.7 09/07/2016 0908   AST 17 09/07/2016 0908   ALT 14 09/07/2016 0908   ALKPHOS 61 09/07/2016 0908    BILITOT 0.55 09/07/2016 0908   GFRNONAA 64 (L) 12/11/2013 1400   GFRAA 74 (L) 12/11/2013 1400    I No results found for: SPEP  Lab Results  Component Value Date   WBC 5.2 09/07/2016   NEUTROABS 3.4 09/07/2016   HGB 11.9 09/07/2016   HCT 36.2 09/07/2016   MCV 80.6 09/07/2016   PLT 147 09/07/2016      Chemistry      Component Value Date/Time   NA 143 09/07/2016 0908   K 3.5 09/07/2016 0908   CL 103 12/11/2013 1400   CO2 25 09/07/2016 0908   BUN 18.6 09/07/2016 0908   CREATININE 0.9 09/07/2016 0908      Component Value Date/Time   CALCIUM 9.6 09/07/2016 0908   ALKPHOS 61 09/07/2016 0908   AST 17 09/07/2016 0908   ALT 14 09/07/2016 0908   BILITOT 0.55 09/07/2016 0908       No results found for: LABCA2  No components found for: LABCA125  No results for input(s): INR in the last 168 hours.  Urinalysis  Component Value Date/Time   COLORURINE YELLOW 12/11/2013 1331   APPEARANCEUR CLEAR 12/11/2013 1331   LABSPEC 1.029 12/11/2013 1331   PHURINE 6.5 12/11/2013 1331   GLUCOSEU NEGATIVE 12/11/2013 1331   HGBUR NEGATIVE 12/11/2013 1331   BILIRUBINUR NEGATIVE 12/11/2013 1331   KETONESUR NEGATIVE 12/11/2013 1331   PROTEINUR NEGATIVE 12/11/2013 1331   UROBILINOGEN 1.0 12/11/2013 1331   NITRITE NEGATIVE 12/11/2013 1331   LEUKOCYTESUR TRACE (A) 12/11/2013 1331    STUDIES:  CLINICAL DATA:  Personal history of right breast cancer status post lumpectomy and radiation 2015  EXAM: 2D DIGITAL DIAGNOSTIC BILATERAL MAMMOGRAM WITH CAD AND ADJUNCT TOMO  COMPARISON:  Previous exam(s).  ACR Breast Density Category b: There are scattered areas of fibroglandular density.  FINDINGS: Cc and MLO views of bilateral breasts, spot tangential view of right breast are submitted. Postsurgical changes identified within the right breast. No suspicious abnormalities identified bilaterally.  Mammographic images were processed with CAD.  IMPRESSION: Benign  findings.  RECOMMENDATION: Bilateral diagnostic mammogram in 1 year.  I have discussed the findings and recommendations with the patient. Results were also provided in writing at the conclusion of the visit. If applicable, a reminder letter will be sent to the patient regarding the next appointment.  BI-RADS CATEGORY  2: Benign.   Electronically Signed   By: Abelardo Diesel M.D.   On: 03/15/2016 09:23      ASSESSMENT: 65 y.o. [place] woman Status post right upper outer quadrant breast biopsy to 12/06/2013 for an atypical papillary lesion.  (1) right lumpectomy 12-2013 showed an intracystic papillary carcinoma measuring 5.2 cm, grade 1, 100% estrogen and 100% progesterone receptor positive, with an MIB-1 of 8% and no HER-2 amplification.  (2) status post right axillary lymph node sampling 01/29/2014, both sentinel lymph nodes being clear.  (3) Oncotype score of 0 predicts a 10 years risk of outside the breast recurrence of 3% if the patient's only systemic therapy is tamoxifen for 5 years  (4) adjuvant radiation 03/17/2014-04/17/2014: Right breast/ 50 Gy in 25 fractions at 2 Gy per fraction (last 2 treatments given BID)   (5) started tamoxifen 05/17/2014.  PLAN: [place] is minimal 2-1/2 years out from definitive surgery for her breast cancer with no evidence of disease recurrence. This is very favorable.  She is tolerating the tamoxifen well and the plan will be to continue that a minimum of 5 years although she herself tells me she wouldn't mind doing it for 10  I gave her information on our yoga classes in case she wishes to participate.  I'm going to see her again in August of next year. She knows to call for any problems that may develop before that visit.   Chauncey Cruel, MD   09/07/2016 10:14 AM

## 2016-09-08 ENCOUNTER — Encounter (HOSPITAL_COMMUNITY): Payer: Self-pay

## 2016-09-08 DIAGNOSIS — M62838 Other muscle spasm: Secondary | ICD-10-CM | POA: Insufficient documentation

## 2016-09-08 DIAGNOSIS — Z87891 Personal history of nicotine dependence: Secondary | ICD-10-CM | POA: Diagnosis not present

## 2016-09-08 DIAGNOSIS — Z853 Personal history of malignant neoplasm of breast: Secondary | ICD-10-CM | POA: Insufficient documentation

## 2016-09-08 DIAGNOSIS — Y999 Unspecified external cause status: Secondary | ICD-10-CM | POA: Diagnosis not present

## 2016-09-08 DIAGNOSIS — M542 Cervicalgia: Secondary | ICD-10-CM | POA: Diagnosis present

## 2016-09-08 DIAGNOSIS — Y939 Activity, unspecified: Secondary | ICD-10-CM | POA: Diagnosis not present

## 2016-09-08 DIAGNOSIS — W010XXA Fall on same level from slipping, tripping and stumbling without subsequent striking against object, initial encounter: Secondary | ICD-10-CM | POA: Diagnosis not present

## 2016-09-08 DIAGNOSIS — Y929 Unspecified place or not applicable: Secondary | ICD-10-CM | POA: Insufficient documentation

## 2016-09-08 NOTE — ED Triage Notes (Signed)
Pt had a fall two days ago onto knees, since had had neck pain that radiates down into shoulders with muscles spasms. Pt took hydrocodone at home without relief.

## 2016-09-09 ENCOUNTER — Emergency Department (HOSPITAL_COMMUNITY)
Admission: EM | Admit: 2016-09-09 | Discharge: 2016-09-09 | Disposition: A | Discharge status: Home / Self Care | Payer: 59 | Attending: Emergency Medicine | Admitting: Emergency Medicine

## 2016-09-09 ENCOUNTER — Emergency Department (HOSPITAL_COMMUNITY): Payer: 59

## 2016-09-09 DIAGNOSIS — M542 Cervicalgia: Secondary | ICD-10-CM

## 2016-09-09 DIAGNOSIS — M62838 Other muscle spasm: Secondary | ICD-10-CM

## 2016-09-09 HISTORY — DX: Malignant (primary) neoplasm, unspecified: C80.1

## 2016-09-09 LAB — I-STAT CHEM 8, ED
BUN: 18 mg/dL (ref 6–20)
CALCIUM ION: 1.05 mmol/L — AB (ref 1.15–1.40)
CHLORIDE: 109 mmol/L (ref 101–111)
Creatinine, Ser: 0.8 mg/dL (ref 0.44–1.00)
Glucose, Bld: 118 mg/dL — ABNORMAL HIGH (ref 65–99)
HCT: 33 % — ABNORMAL LOW (ref 36.0–46.0)
HEMOGLOBIN: 11.2 g/dL — AB (ref 12.0–15.0)
POTASSIUM: 4.1 mmol/L (ref 3.5–5.1)
SODIUM: 139 mmol/L (ref 135–145)
TCO2: 22 mmol/L (ref 0–100)

## 2016-09-09 MED ORDER — KETOROLAC TROMETHAMINE 30 MG/ML IJ SOLN
15.0000 mg | Freq: Once | INTRAMUSCULAR | Status: AC
  Administered 2016-09-09: 15 mg via INTRAVENOUS
  Filled 2016-09-09: qty 1

## 2016-09-09 MED ORDER — CYCLOBENZAPRINE HCL 10 MG PO TABS: 10.0000 mg | ORAL_TABLET | Freq: Two times a day (BID) | ORAL | 0 refills | Status: DC | PRN

## 2016-09-09 MED ORDER — IOPAMIDOL (ISOVUE-370) INJECTION 76%
INTRAVENOUS | Status: AC
  Administered 2016-09-09: 50 mL
  Filled 2016-09-09: qty 100

## 2016-09-09 NOTE — ED Notes (Signed)
Phlebotomy at bedside at this time.

## 2016-09-09 NOTE — ED Provider Notes (Signed)
[facility] DEPT Provider Note   CSN: [id number] Arrival date & time: 09/08/16  2248 w11 By signing my name below, I, Hansel Feinstein, attest that this documentation has been prepared under the direction and in the presence of Ezequiel Essex, MD. Electronically Signed: Hansel Feinstein, ED Scribe. 09/09/16. 4:07 AM.     History   Chief Complaint Chief Complaint  Patient presents with  . Neck Pain    HPI [name] [name] is a 65 y.o. female with h/o arthritis, breast cancer in remission (no current chemo or radiation) who presents to the Emergency Department complaining of moderate, sharp, shooting left-sided neck pain with radiation to shoulders onset 2-3 days ago and worsened today. Pt reports her pain began 1-2 days after she tripped and fell forward onto her knees. She denies LOC, head injury or additional injuries from this fall. Pt states her pain was not present prior to this fall. Pt states pain is worsened with neck rotation and flexion. She has tried Vimovo earlier today with no relief of pain. No h/o neck problems or similar neck pain. No h/o HTN. Pt denies HA, emesis, nausea, fever, weakness, numbness, tingling. CP, SOB, abdominal pain, vision changes, increased back pain from baseline.   The history is provided by the patient. No language interpreter was used.    Past Medical History:  Diagnosis Date  . Arthritis    right knee  . Cancer (HCC)    breast  . Full dentures   . Radiation    Left Breast  . Wears glasses     Patient Active Problem List   Diagnosis Date Noted  . Breast cancer of lower-inner quadrant of right female breast ([place]) 12/31/2013    Past Surgical History:  Procedure Laterality Date  . APPENDECTOMY    . AXILLARY SENTINEL NODE BIOPSY Right 01/29/2014   Procedure: RIGHT AXILLARY SENTINEL LYMPHNODE BIOPSY;  Surgeon: Adin Hector, MD;  Location: [facility];  Service: General;  Laterality: Right;  . BREAST LUMPECTOMY Right 12/16/2013   Procedure: LUMPECTOMY;  Surgeon: Adin Hector, MD;  Location: [facility];  Service: General;  Laterality: Right;  . TUBAL LIGATION      OB History    No data available       Home Medications    Prior to Admission medications   Medication Sig Start Date End Date Taking? Authorizing Provider  Naproxen-Esomeprazole 500-20 MG TBEC Take by mouth. 09/07/16   Chauncey Cruel, MD  tamoxifen (NOLVADEX) 20 MG tablet TAKE 1 TABLET (20 MG TOTAL) BY MOUTH DAILY. 09/07/16   Chauncey Cruel, MD    Family History Family History  Problem Relation Age of Onset  . Hypertension Sister   . Hypertension Brother     Social History Social History  Substance Use Topics  . Smoking status: Former Smoker    Quit date: 12/10/1990  . Smokeless tobacco: Never Used  . Alcohol use No     Allergies   Codeine   Review of Systems Review of Systems A complete 10 system review of systems was obtained and all systems are negative except as noted in the HPI and PMH.    Physical Exam Updated Vital Signs BP 125/73   Pulse 80   Temp 98.7 F (37.1 C) (Oral)   Resp 18   Ht 5\' 4"  (1.626 m)   Wt 180 lb (81.6 kg)   SpO2 100%   BMI 30.90 kg/m   Physical Exam  Constitutional: She is  oriented to person, place, and time. She appears well-developed and well-nourished. No distress.  HENT:  Head: Normocephalic and atraumatic.  Mouth/Throat: Oropharynx is clear and moist. No oropharyngeal exudate.  Eyes: Conjunctivae and EOM are normal. Pupils are equal, round, and reactive to light.  Neck: Normal range of motion. Neck supple.  No meningismus.  Cardiovascular: Normal rate, regular rhythm, normal heart sounds and intact distal pulses.   No murmur heard. Pulmonary/Chest: Effort normal and breath sounds normal. No respiratory distress.  Abdominal: Soft. There is no tenderness. There is no rebound and no guarding.  Musculoskeletal: Normal range of motion. She exhibits tenderness. She  exhibits no edema.  Paraspinal cervical pain that radiates to rhomboid and left trapezius. No midline pain. Equal grip strength and radial pulses.   Neurological: She is alert and oriented to person, place, and time. No cranial nerve deficit. She exhibits normal muscle tone. Coordination normal.   5/5 strength throughout. CN 2-12 intact.Equal grip strength.   Skin: Skin is warm.  Psychiatric: She has a normal mood and affect. Her behavior is normal.  Nursing note and vitals reviewed.    ED Treatments / Results   DIAGNOSTIC STUDIES: Oxygen Saturation is 100% on RA, normal by my interpretation.    COORDINATION OF CARE: 3:57 AM Discussed treatment plan with pt at bedside which includes CT neck and pt agreed to plan.    Labs (all labs ordered are listed, but only abnormal results are displayed) Labs Reviewed - No data to display  EKG  EKG Interpretation None       Radiology Ct Angio Head W Or Wo Contrast  Result Date: 09/09/2016 CLINICAL DATA:  65 y/o F; neck pain after a fall. History of breast cancer. EXAM: CT ANGIOGRAPHY HEAD AND NECK TECHNIQUE: Multidetector CT imaging of the head and neck was performed using the standard protocol during bolus administration of intravenous contrast. Multiplanar CT image reconstructions and MIPs were obtained to evaluate the vascular anatomy. Carotid stenosis measurements (when applicable) are obtained utilizing NASCET criteria, using the distal internal carotid diameter as the denominator. CONTRAST:  50 cc Isovue 370 COMPARISON:  None. FINDINGS: CT HEAD FINDINGS Brain: No evidence of acute infarction, hemorrhage, hydrocephalus, extra-axial collection or mass lesion/mass effect. Vascular: As below. Skull: Normal. Negative for fracture or focal lesion. Sinuses: Imaged portions are clear. Bilateral external auditory canal opacification is likely cerumen. Orbits: No acute finding. Review of the MIP images confirms the above findings CTA NECK FINDINGS  Aortic arch: Standard branching. Imaged portion shows no evidence of aneurysm or dissection. No significant stenosis of the major arch vessel origins. Right carotid system: No evidence of dissection, stenosis (50% or greater) or occlusion. Left carotid system: No evidence of dissection, stenosis (50% or greater) or occlusion. Vertebral arteries: Codominant. No evidence of dissection, stenosis (50% or greater) or occlusion. Skeleton: No acute fracture is identified. Straightening of cervical lordosis without listhesis. Moderate cervical spondylosis with predominantly discogenic degenerative changes from the C3 -C6 levels. No high-grade bony foraminal narrowing or canal stenosis. 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 Upper chest: Mild centrilobular emphysema. Review of the MIP images confirms the above findings CTA HEAD FINDINGS Anterior circulation: No significant stenosis, proximal occlusion, aneurysm, or vascular malformation. Posterior circulation: No significant stenosis, proximal occlusion, aneurysm, or vascular malformation. Venous sinuses: As permitted by contrast timing, patent. Anatomic variants: Left fetal posterior cerebral artery. No right posterior communicating artery identified, likely hypoplastic  or absent. Left larger than right A1 segments with prominent anterior communicating artery, normal variant. Delayed phase: No abnormal intracranial enhancement. Review of the MIP images confirms the above findings IMPRESSION: 1. Carotid and vertebral arteries of the neck are patent without evidence of dissection, significant stenosis, or occlusion. 2. Patent circle of Willis without evidence for stenosis, proximal occlusion, aneurysm, or vascular malformation. 3. No acute intracranial abnormality or abnormal enhancement of the brain. 4. Moderate cervical spondylosis with predominantly discogenic degenerative changes from C3-C6. 5. No acute  fracture identified. 6. Mild emphysema of lung apices. Electronically Signed   By: Kristine Garbe M.D.   On: 09/09/2016 06:13   Ct Angio Neck W And/or Wo Contrast  Result Date: 09/09/2016 CLINICAL DATA:  65 y/o F; neck pain after a fall. History of breast cancer. EXAM: CT ANGIOGRAPHY HEAD AND NECK TECHNIQUE: Multidetector CT imaging of the head and neck was performed using the standard protocol during bolus administration of intravenous contrast. Multiplanar CT image reconstructions and MIPs were obtained to evaluate the vascular anatomy. Carotid stenosis measurements (when applicable) are obtained utilizing NASCET criteria, using the distal internal carotid diameter as the denominator. CONTRAST:  50 cc Isovue 370 COMPARISON:  None. FINDINGS: CT HEAD FINDINGS Brain: No evidence of acute infarction, hemorrhage, hydrocephalus, extra-axial collection or mass lesion/mass effect. Vascular: As below. Skull: Normal. Negative for fracture or focal lesion. Sinuses: Imaged portions are clear. Bilateral external auditory canal opacification is likely cerumen. Orbits: No acute finding. Review of the MIP images confirms the above findings CTA NECK FINDINGS Aortic arch: Standard branching. Imaged portion shows no evidence of aneurysm or dissection. No significant stenosis of the major arch vessel origins. Right carotid system: No evidence of dissection, stenosis (50% or greater) or occlusion. Left carotid system: No evidence of dissection, stenosis (50% or greater) or occlusion. Vertebral arteries: Codominant. No evidence of dissection, stenosis (50% or greater) or occlusion. Skeleton: No acute fracture is identified. Straightening of cervical lordosis without listhesis. Moderate cervical spondylosis with predominantly discogenic degenerative changes from the C3 -C6 levels. No high-grade bony foraminal narrowing or canal stenosis. 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 Upper chest: Mild centrilobular emphysema. Review of the MIP images confirms the above findings CTA HEAD FINDINGS Anterior circulation: No significant stenosis, proximal occlusion, aneurysm, or vascular malformation. Posterior circulation: No significant stenosis, proximal occlusion, aneurysm, or vascular malformation. Venous sinuses: As permitted by contrast timing, patent. Anatomic variants: Left fetal posterior cerebral artery. No right posterior communicating artery identified, likely hypoplastic or absent. Left larger than right A1 segments with prominent anterior communicating artery, normal variant. Delayed phase: No abnormal intracranial enhancement. Review of the MIP images confirms the above findings IMPRESSION: 1. Carotid and vertebral arteries of the neck are patent without evidence of dissection, significant stenosis, or occlusion. 2. Patent circle of Willis without evidence for stenosis, proximal occlusion, aneurysm, or vascular malformation. 3. No acute intracranial abnormality or abnormal enhancement of the brain. 4. Moderate cervical spondylosis with predominantly discogenic degenerative changes from C3-C6. 5. No acute fracture identified. 6. Mild emphysema of lung apices. Electronically Signed   By: Kristine Garbe M.D.   On: 09/09/2016 06:13    Procedures Procedures (including critical care time)  Medications Ordered in ED Medications - No data to display   Initial Impression / Assessment and Plan / ED Course  I have reviewed the triage vital signs and the nursing notes.  Pertinent labs &  imaging results that were available during my care of the patient were reviewed by me and considered in my medical decision making (see chart for details).  Clinical Course    L sided neck pain worsening since fall 2 days ago. No headache. No arm or leg weakness, numbness, or tingling.  Paraspinal L neck and rhomboid/trapezius pain.  Equal grip strengths and  pulses. No midline pain.  CT shows no fracture. No vertebral dissection. Treat supportively as strain with NSAIDs and muscle relaxers. Followup with PCP. Return precautions discussed.  Final Clinical Impressions(s) / ED Diagnoses   Final diagnoses:  Neck pain  Muscle spasms of neck    New Prescriptions New Prescriptions   No medications on file   I personally performed the services described in this documentation, which was scribed in my presence. The recorded information has been reviewed and is accurate.    Ezequiel Essex, MD 09/09/16 2049

## 2016-09-09 NOTE — ED Notes (Signed)
Patient transported to CT 

## 2016-09-09 NOTE — Discharge Instructions (Signed)
Your bones and blood vessels appear normal. Followup with your doctor. Return to the ED if you develop new or worsening symptoms.

## 2016-10-05 ENCOUNTER — Other Ambulatory Visit: Payer: Self-pay | Admitting: Oncology

## 2016-10-05 DIAGNOSIS — C50311 Malignant neoplasm of lower-inner quadrant of right female breast: Secondary | ICD-10-CM

## 2017-02-09 ENCOUNTER — Other Ambulatory Visit: Payer: Self-pay

## 2017-02-09 ENCOUNTER — Other Ambulatory Visit: Payer: Self-pay | Admitting: Oncology

## 2017-02-09 DIAGNOSIS — Z853 Personal history of malignant neoplasm of breast: Secondary | ICD-10-CM

## 2017-03-16 ENCOUNTER — Ambulatory Visit
Admission: RE | Admit: 2017-03-16 | Discharge: 2017-03-16 | Disposition: A | Discharge status: Home / Self Care | Payer: 59 | Source: Ambulatory Visit | Attending: Oncology | Admitting: Oncology

## 2017-03-16 DIAGNOSIS — Z853 Personal history of malignant neoplasm of breast: Secondary | ICD-10-CM

## 2017-06-13 ENCOUNTER — Other Ambulatory Visit: Payer: Self-pay

## 2017-06-13 DIAGNOSIS — C50311 Malignant neoplasm of lower-inner quadrant of right female breast: Secondary | ICD-10-CM

## 2017-06-13 DIAGNOSIS — Z170 Estrogen receptor positive status [ER+]: Principal | ICD-10-CM

## 2017-06-14 ENCOUNTER — Ambulatory Visit (HOSPITAL_BASED_OUTPATIENT_CLINIC_OR_DEPARTMENT_OTHER): Payer: 59 | Admitting: Oncology

## 2017-06-14 ENCOUNTER — Other Ambulatory Visit (HOSPITAL_BASED_OUTPATIENT_CLINIC_OR_DEPARTMENT_OTHER): Payer: 59

## 2017-06-14 VITALS — BP 148/68 | HR 70 | Temp 98.2°F | Resp 18 | Ht 64.0 in | Wt 173.5 lb

## 2017-06-14 DIAGNOSIS — C50411 Malignant neoplasm of upper-outer quadrant of right female breast: Secondary | ICD-10-CM

## 2017-06-14 DIAGNOSIS — C50311 Malignant neoplasm of lower-inner quadrant of right female breast: Secondary | ICD-10-CM

## 2017-06-14 DIAGNOSIS — Z170 Estrogen receptor positive status [ER+]: Principal | ICD-10-CM

## 2017-06-14 DIAGNOSIS — Z79810 Long term (current) use of selective estrogen receptor modulators (SERMs): Secondary | ICD-10-CM

## 2017-06-14 LAB — COMPREHENSIVE METABOLIC PANEL
ALBUMIN: 3.5 g/dL (ref 3.5–5.0)
ALT: 15 U/L (ref 0–55)
AST: 18 U/L (ref 5–34)
Alkaline Phosphatase: 62 U/L (ref 40–150)
Anion Gap: 6 mEq/L (ref 3–11)
BUN: 17.2 mg/dL (ref 7.0–26.0)
CALCIUM: 9.6 mg/dL (ref 8.4–10.4)
CO2: 26 mEq/L (ref 22–29)
Chloride: 110 mEq/L — ABNORMAL HIGH (ref 98–109)
Creatinine: 0.9 mg/dL (ref 0.6–1.1)
EGFR: 76 mL/min/{1.73_m2} — ABNORMAL LOW (ref >90)
Glucose: 93 mg/dl (ref 70–140)
Potassium: 4 mEq/L (ref 3.5–5.1)
Sodium: 143 mEq/L (ref 136–145)
TOTAL PROTEIN: 6.4 g/dL (ref 6.4–8.3)
Total Bilirubin: 0.37 mg/dL (ref 0.20–1.20)

## 2017-06-14 LAB — CBC WITH DIFFERENTIAL/PLATELET
BASO%: 0.8 % (ref 0.0–2.0)
Basophils Absolute: 0 10*3/uL (ref 0.0–0.1)
EOS%: 1.2 % (ref 0.0–7.0)
Eosinophils Absolute: 0.1 10*3/uL (ref 0.0–0.5)
HEMATOCRIT: 35.5 % (ref 34.8–46.6)
HEMOGLOBIN: 11.6 g/dL (ref 11.6–15.9)
LYMPH#: 1.5 10*3/uL (ref 0.9–3.3)
LYMPH%: 28.5 % (ref 14.0–49.7)
MCH: 26.7 pg (ref 25.1–34.0)
MCHC: 32.7 g/dL (ref 31.5–36.0)
MCV: 81.5 fL (ref 79.5–101.0)
MONO#: 0.4 10*3/uL (ref 0.1–0.9)
MONO%: 8 % (ref 0.0–14.0)
NEUT%: 61.5 % (ref 38.4–76.8)
NEUTROS ABS: 3.3 10*3/uL (ref 1.5–6.5)
Platelets: 134 10*3/uL — ABNORMAL LOW (ref 145–400)
RBC: 4.35 10*6/uL (ref 3.70–5.45)
RDW: 13.7 % (ref 11.2–14.5)
WBC: 5.3 10*3/uL (ref 3.9–10.3)

## 2017-06-14 MED ORDER — TAMOXIFEN CITRATE 20 MG PO TABS
20.0000 mg | ORAL_TABLET | Freq: Every day | ORAL | 4 refills | Status: DC
Start: 2017-06-14 — End: 2017-07-21

## 2017-06-14 NOTE — Progress Notes (Signed)
Lower density: [facility]  [phone] [phone]   ID: [name] DOB: 30-Apr-1951  MR#: [id number]  QMV#:[id number]  Patient Care Team: [name] Dew, FNP as PCP - General (Family Medicine) [name] [name], [name] Dad, MD as Consulting Physician (Oncology) PCP: [name] Dew, FNP GYN: [name] Racer MD SU: [name] Skates MD OTHER MD: [name] Silversmith MD  CHIEF COMPLAINT: Estrogen receptor positive breast cancer  CURRENT TREATMENT: Tamoxifen   BREAST CANCER HISTORY: From doctor Dana Allan 01/28/2014 intake nodes:   "[name] [name] is a 66 y.o. female. Has not had a mammogram in about 12 years. Approximately in December patient felt a mass in the lateral right breast. She does tell me that she has occasionally had some pain. She had mammogram performed that showed a 6 cm irregular mass in the upper outer quadrant of the right breast. Ultrasound of the right axilla was normal. The left breast showed a benign lymph node at the 3:00 position. Patient underwent a biopsy of the right breast mass that showed atypical papillary lesion. Because of this she was seen by Dr. Fanny [name]. He recommended a lumpectomy and removal of this lesion. Patient went on to have a lumpectomy performed on 11/27/2013. 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 Margins were negative. Tumor was ER +100% PR +100% HER-2/neu negative with a proliferation marker Ki-67 8%. She went on to have MRI of the breasts performed that were negative. Patient's case was discussed at the multidisciplinary breast conference. She was recommended further workup including doing sentinel lymph node."  Her subsequent history is as detailed below.  INTERVAL HISTORY: [name] [name] returns today for follow-up and treatment of her estrogen receptor positive breast cancer. She continues on tamoxifen. She tolerates it well. She doesn't have hot  flashes. She occasionally feels a little bit warm. She doesn't have a problem with vaginal discharge. She obtains a drug at a good price.   REVIEW OF SYSTEMS: [name] [name] tells me her hair is thinning a little. She says her mother 0 also thinned when she was about this age. She has a little bit of discomfort in the surgical breast at times. This comes and goes. She exercises by walking. Otherwise a detailed review of systems today was stable  PAST MEDICAL HISTORY: Past Medical History:  Diagnosis Date  . Arthritis    right knee  . Cancer (HCC)    breast  . Full dentures   . Radiation    Left Breast  . Wears glasses     PAST SURGICAL HISTORY: Past Surgical History:  Procedure Laterality Date  . APPENDECTOMY    . AXILLARY SENTINEL NODE BIOPSY Right 01/29/2014   Procedure: RIGHT AXILLARY SENTINEL LYMPHNODE BIOPSY;  Surgeon: Adin Hector, MD;  Location: [facility];  Service: General;  Laterality: Right;  . BREAST LUMPECTOMY Right 12/16/2013   Procedure: LUMPECTOMY;  Surgeon: Adin Hector, MD;  Location: [name];  Service: General;  Laterality: Right;  . TUBAL LIGATION      FAMILY HISTORY Family History  Problem Relation Age of Onset  . Hypertension Sister   . Hypertension Brother    the patient's father died at the age of 77 from cancer, but the patient does not know the primary site. The patient's mother died "of natural causes" at age 48. The patient has 2 brothers, one half-sister. There is no cancer history in the family to her knowledge  GYNECOLOGIC HISTORY:  No LMP recorded. Patient is postmenopausal. Menarche age 6, first live birth age 39, the patient is [name] P2. She went through the change of life around age 43. She did not take hormone replacement.  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 She is widowed. At home she lives with her sister [name] [name] . The patient's  children are [name] [name] who works as a Runner, broadcasting/film/video in [place], and [name] [name],, works at urgent care here in town. The patient has 5 grandchildren. She is a Psychologist, forensic    ADVANCED DIRECTIVES: Not in place   HEALTH MAINTENANCE: Social History  Substance Use Topics  . Smoking status: Former Smoker    Quit date: 12/10/1990  . Smokeless tobacco: Never Used  . Alcohol use No     Colonoscopy: 2016?  PAP: Per Dr. Ruthann Cancer  Bone density:  Lipid panel:  Allergies  Allergen Reactions  . Codeine Nausea And Vomiting    Current Outpatient Prescriptions  Medication Sig Dispense Refill  . Naproxen-Esomeprazole 500-20 MG TBEC Take by mouth. 60 tablet   . tamoxifen (NOLVADEX) 20 MG tablet Take 1 tablet (20 mg total) by mouth daily. 90 tablet 4   No current facility-administered medications for this visit.     OBJECTIVE: Middle-aged African American womanWho appears well  Vitals:   06/14/17 1145  BP: (!) 148/68  Pulse: 70  Resp: 18  Temp: 98.2 F (36.8 C)  SpO2: 100%     Body mass index is 29.78 kg/m.    ECOG FS:0 - Asymptomatic  Sclerae unicteric, EOMs intact Oropharynx clear and moist No cervical or supraclavicular adenopathy Lungs no rales or rhonchi Heart regular rate and rhythm Abd soft, nontender, positive bowel sounds MSK no focal spinal tenderness, no upper extremity lymphedema Neuro: nonfocal, well oriented, appropriate affect Breasts: She has had lumpectomy and radiation to the right breast. There is no evidence of local recurrence. The left breast is benign. Both axillae are benign.  LAB RESULTS:  CMP     Component Value Date/Time   NA 139 09/09/2016 0432   NA 143 09/07/2016 0908   K 4.1 09/09/2016 0432   K 3.5 09/07/2016 0908   CL 109 09/09/2016 0432   CO2 25 09/07/2016 0908   GLUCOSE 118 (H) 09/09/2016 0432   GLUCOSE 84 09/07/2016 0908   BUN 18 09/09/2016 0432   BUN 18.6 09/07/2016 0908   CREATININE 0.80 09/09/2016 0432   CREATININE  0.9 09/07/2016 0908   CALCIUM 9.6 09/07/2016 0908   PROT 7.0 09/07/2016 0908   ALBUMIN 3.7 09/07/2016 0908   AST 17 09/07/2016 0908   ALT 14 09/07/2016 0908   ALKPHOS 61 09/07/2016 0908   BILITOT 0.55 09/07/2016 0908   GFRNONAA 64 (L) 12/11/2013 1400   GFRAA 74 (L) 12/11/2013 1400    I No results found for: SPEP  Lab Results  Component Value Date   WBC 5.3 06/14/2017   NEUTROABS 3.3 06/14/2017   HGB 11.6 06/14/2017   HCT 35.5 06/14/2017   MCV 81.5 06/14/2017   PLT 134 (L) 06/14/2017      Chemistry      Component Value Date/Time   NA 139 09/09/2016 0432   NA 143 09/07/2016 0908   K 4.1 09/09/2016 0432   K 3.5 09/07/2016 0908   CL 109 09/09/2016 0432   CO2 25 09/07/2016 0908   BUN 18 09/09/2016 0432   BUN 18.6 09/07/2016 0908   CREATININE  0.80 09/09/2016 0432   CREATININE 0.9 09/07/2016 0908      Component Value Date/Time   CALCIUM 9.6 09/07/2016 0908   ALKPHOS 61 09/07/2016 0908   AST 17 09/07/2016 0908   ALT 14 09/07/2016 0908   BILITOT 0.55 09/07/2016 0908       No results found for: LABCA2  No components found for: LABCA125  No results for input(s): INR in the last 168 hours.  Urinalysis    Component Value Date/Time   COLORURINE YELLOW 12/11/2013 1331   APPEARANCEUR CLEAR 12/11/2013 1331   LABSPEC 1.029 12/11/2013 1331   PHURINE 6.5 12/11/2013 1331   GLUCOSEU NEGATIVE 12/11/2013 1331   HGBUR NEGATIVE 12/11/2013 1331   BILIRUBINUR NEGATIVE 12/11/2013 1331   KETONESUR NEGATIVE 12/11/2013 1331   PROTEINUR NEGATIVE 12/11/2013 1331   UROBILINOGEN 1.0 12/11/2013 1331   NITRITE NEGATIVE 12/11/2013 1331   LEUKOCYTESUR TRACE (A) 12/11/2013 1331    STUDIES:  Bilateral diagnostic mammography with tomography at the [facility] 03/16/2017 found the breast density to be category A. There was no evidence of malignancy.   ASSESSMENT: 66 y.o. [place] woman Status post right upper outer quadrant breast biopsy to 12/06/2013 for an atypical papillary  lesion.  (1) right lumpectomy March 2015 showed an intracystic papillary carcinoma measuring 5.2 cm, grade 1, 100% estrogen and 100% progesterone receptor positive, with an MIB-1 of 8% and no HER-2 amplification.  (2) status post right axillary lymph node sampling 01/29/2014, both sentinel lymph nodes being clear.  (3) Oncotype score of 0 predicts a 10 years risk of outside the breast recurrence of 3% if the patient's only systemic therapy is tamoxifen for 5 years  (4) adjuvant radiation 03/17/2014-04/17/2014: Right breast/ 50 Gy in 25 fractions at 2 Gy per fraction (last 2 treatments given BID)   (5) started tamoxifen 05/17/2014.  PLAN: Winda Is now 3-1/2 years out from definitive surgery for her breast cancer with no evidence of disease recurrence. This is very favorable.  She continues on tamoxifen, with good tolerance. The plan is to continue it for a total of 5 years.   We reviewed her main mammography, which was very favorable. Her breasts are no longer dense. This makes the mammogram or sensitive.   She'll see [place] again in one year. She knows to call for any problems that may develop before that visit.   Chauncey Cruel, MD   06/14/2017 12:11 PM

## 2017-07-21 ENCOUNTER — Telehealth: Payer: Self-pay

## 2017-07-21 DIAGNOSIS — C50311 Malignant neoplasm of lower-inner quadrant of right female breast: Secondary | ICD-10-CM

## 2017-07-21 MED ORDER — TAMOXIFEN CITRATE 20 MG PO TABS: 20.0000 mg | ORAL_TABLET | Freq: Every day | ORAL | 0 refills | Status: DC

## 2017-07-21 NOTE — Telephone Encounter (Signed)
Pt called she left her medicine at home. She is out of town. She is asking for 3 tabs Tamoxifen to be sent to [facility] [address]. Done.

## 2017-09-05 ENCOUNTER — Emergency Department (HOSPITAL_BASED_OUTPATIENT_CLINIC_OR_DEPARTMENT_OTHER)
Admission: EM | Admit: 2017-09-05 | Discharge: 2017-09-05 | Disposition: A | Discharge status: Home / Self Care | Payer: No Typology Code available for payment source | Attending: Emergency Medicine | Admitting: Emergency Medicine

## 2017-09-05 ENCOUNTER — Encounter (HOSPITAL_BASED_OUTPATIENT_CLINIC_OR_DEPARTMENT_OTHER): Payer: Self-pay | Admitting: *Deleted

## 2017-09-05 ENCOUNTER — Other Ambulatory Visit: Payer: Self-pay

## 2017-09-05 ENCOUNTER — Emergency Department (HOSPITAL_BASED_OUTPATIENT_CLINIC_OR_DEPARTMENT_OTHER): Payer: No Typology Code available for payment source

## 2017-09-05 DIAGNOSIS — M25531 Pain in right wrist: Secondary | ICD-10-CM | POA: Insufficient documentation

## 2017-09-05 DIAGNOSIS — M79651 Pain in right thigh: Secondary | ICD-10-CM | POA: Insufficient documentation

## 2017-09-05 DIAGNOSIS — R52 Pain, unspecified: Secondary | ICD-10-CM

## 2017-09-05 DIAGNOSIS — M7918 Myalgia, other site: Secondary | ICD-10-CM | POA: Insufficient documentation

## 2017-09-05 DIAGNOSIS — R0789 Other chest pain: Secondary | ICD-10-CM | POA: Insufficient documentation

## 2017-09-05 DIAGNOSIS — Z853 Personal history of malignant neoplasm of breast: Secondary | ICD-10-CM | POA: Insufficient documentation

## 2017-09-05 DIAGNOSIS — M79641 Pain in right hand: Secondary | ICD-10-CM | POA: Insufficient documentation

## 2017-09-05 DIAGNOSIS — Z87891 Personal history of nicotine dependence: Secondary | ICD-10-CM | POA: Insufficient documentation

## 2017-09-05 DIAGNOSIS — R109 Unspecified abdominal pain: Secondary | ICD-10-CM | POA: Insufficient documentation

## 2017-09-05 DIAGNOSIS — Y999 Unspecified external cause status: Secondary | ICD-10-CM | POA: Insufficient documentation

## 2017-09-05 DIAGNOSIS — Z79899 Other long term (current) drug therapy: Secondary | ICD-10-CM | POA: Insufficient documentation

## 2017-09-05 LAB — CBC WITH DIFFERENTIAL/PLATELET
Basophils Absolute: 0.1 10*3/uL (ref 0.0–0.1)
Basophils Relative: 1 %
EOS PCT: 2 %
Eosinophils Absolute: 0.1 10*3/uL (ref 0.0–0.7)
HEMATOCRIT: 36.2 % (ref 36.0–46.0)
Hemoglobin: 11.8 g/dL — ABNORMAL LOW (ref 12.0–15.0)
LYMPHS ABS: 1.7 10*3/uL (ref 0.7–4.0)
Lymphocytes Relative: 29 %
MCH: 26.5 pg (ref 26.0–34.0)
MCHC: 32.6 g/dL (ref 30.0–36.0)
MCV: 81.2 fL (ref 78.0–100.0)
MONOS PCT: 6 %
Monocytes Absolute: 0.4 10*3/uL (ref 0.1–1.0)
Neutro Abs: 3.7 10*3/uL (ref 1.7–7.7)
Neutrophils Relative %: 62 %
Platelets: 145 10*3/uL — ABNORMAL LOW (ref 150–400)
RBC: 4.46 MIL/uL (ref 3.87–5.11)
RDW: 13.5 % (ref 11.5–15.5)
WBC: 6 10*3/uL (ref 4.0–10.5)

## 2017-09-05 LAB — COMPREHENSIVE METABOLIC PANEL
ALK PHOS: 58 U/L (ref 38–126)
ALT: 18 U/L (ref 14–54)
AST: 27 U/L (ref 15–41)
Albumin: 3.9 g/dL (ref 3.5–5.0)
Anion gap: 8 (ref 5–15)
BILIRUBIN TOTAL: 0.5 mg/dL (ref 0.3–1.2)
BUN: 19 mg/dL (ref 6–20)
CALCIUM: 9.5 mg/dL (ref 8.9–10.3)
CO2: 25 mmol/L (ref 22–32)
CREATININE: 0.94 mg/dL (ref 0.44–1.00)
Chloride: 105 mmol/L (ref 101–111)
GFR calc Af Amer: >60 mL/min (ref >60)
Glucose, Bld: 149 mg/dL — ABNORMAL HIGH (ref 65–99)
Potassium: 3.9 mmol/L (ref 3.5–5.1)
Sodium: 138 mmol/L (ref 135–145)
TOTAL PROTEIN: 7 g/dL (ref 6.5–8.1)

## 2017-09-05 MED ORDER — IOPAMIDOL (ISOVUE-300) INJECTION 61%
100.0000 mL | Freq: Once | INTRAVENOUS | Status: AC | PRN
  Administered 2017-09-05: 100 mL via INTRAVENOUS

## 2017-09-05 MED ORDER — FENTANYL CITRATE (PF) 100 MCG/2ML IJ SOLN
50.0000 ug | Freq: Once | INTRAMUSCULAR | Status: AC
  Administered 2017-09-05: 50 ug via INTRAVENOUS
  Filled 2017-09-05: qty 2

## 2017-09-05 MED ORDER — NAPROXEN 250 MG PO TABS
250.0000 mg | ORAL_TABLET | Freq: Once | ORAL | Status: AC
  Administered 2017-09-05: 250 mg via ORAL
  Filled 2017-09-05: qty 1

## 2017-09-05 NOTE — ED Notes (Signed)
Pt educated about not driving or performing other critical tasks (such as operating heavy machinery, caring for infant/toddler/child) due to sedative nature of medications received in ED. Also warned about risks of consuming alcohol or taking other medications with sedative properties. Pt/caregiver verbalized understanding.

## 2017-09-05 NOTE — ED Notes (Signed)
Pt. Placed in [place] per order.  Pt. Tolerated well.

## 2017-09-05 NOTE — Discharge Instructions (Addendum)
Take Tylenol or Aleve for pain as needed.  Call the number on these directions to get a primary care physician.  The CT scan of your abdomen shows that you have a 8 mm cyst on your pancreas.  You should have a repeat CT scan of your abdomen with attention to the pancreas or MRI /MRCP scan using pancreatic protocol in 2 years to recheck the cyst.  You also have enlargement around the left ovary and should have a pelvic ultrasound within the next 3-6 months to recheck that area.  You can ask your new primary care physician to order those tests for you.  Return if concern for any reason or see an urgent care center.
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
## 2017-09-11 ENCOUNTER — Other Ambulatory Visit: Payer: Self-pay

## 2017-09-11 ENCOUNTER — Encounter (HOSPITAL_COMMUNITY): Payer: Self-pay | Admitting: Emergency Medicine

## 2017-09-11 ENCOUNTER — Ambulatory Visit (HOSPITAL_COMMUNITY)
Admission: EM | Admit: 2017-09-11 | Discharge: 2017-09-11 | Disposition: A | Discharge status: Home / Self Care | Payer: 59 | Attending: Emergency Medicine | Admitting: Emergency Medicine

## 2017-09-11 DIAGNOSIS — T148XXA Other injury of unspecified body region, initial encounter: Secondary | ICD-10-CM

## 2017-09-11 DIAGNOSIS — M791 Myalgia, unspecified site: Secondary | ICD-10-CM

## 2017-09-11 DIAGNOSIS — S20219A Contusion of unspecified front wall of thorax, initial encounter: Secondary | ICD-10-CM

## 2017-09-11 DIAGNOSIS — S20211A Contusion of right front wall of thorax, initial encounter: Secondary | ICD-10-CM

## 2017-09-11 MED ORDER — NAPROXEN 375 MG PO TABS: 375.0000 mg | ORAL_TABLET | Freq: Two times a day (BID) | ORAL | 0 refills | Status: DC

## 2017-09-11 MED ORDER — TRAMADOL HCL 50 MG PO TABS: ORAL_TABLET | ORAL | 0 refills | Status: DC
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
## 2017-09-11 NOTE — Discharge Instructions (Signed)
Ice to your chest and he to the back muscle. Perform stretches as demonstrated. Take medication as directed. The tramadol can cause some drowsiness. Recommend starting out with one half tablet. Take with food. Take precautions against falling.

## 2017-09-22 ENCOUNTER — Ambulatory Visit: Payer: 59 | Admitting: Family Medicine

## 2017-09-27 ENCOUNTER — Telehealth: Payer: Self-pay | Admitting: Family Medicine

## 2017-09-27 NOTE — Telephone Encounter (Signed)
Spoke with pt and scheduled her for 10/03/17. Nothing further needed at this time.

## 2017-09-27 NOTE — Telephone Encounter (Signed)
Copied from [place]. Topic: Quick Communication - See Telephone Encounter >> Sep 27, 2017  3:31 PM Cleaster Corin, [place] wrote: CRM for notification. See Telephone encounter for:   09/27/17. Pt. Would like to schedule new pt. Appt. With Dr. Volanda Napoleon next week of the 17th. but wasn't able to find her an appt. Dr. Volanda Napoleon will Out of office the next week after that Pt. Would like to see Dr. Before the year is out. Please give pt. A call if able to fit onto schedule [phone]

## 2017-09-28 ENCOUNTER — Ambulatory Visit: Payer: 59 | Admitting: Family Medicine

## 2017-10-03 ENCOUNTER — Encounter: Payer: Self-pay | Admitting: Family Medicine

## 2017-10-03 ENCOUNTER — Ambulatory Visit: Payer: 59 | Admitting: Family Medicine

## 2017-10-03 DIAGNOSIS — S20211D Contusion of right front wall of thorax, subsequent encounter: Secondary | ICD-10-CM | POA: Diagnosis not present

## 2017-10-03 DIAGNOSIS — Z853 Personal history of malignant neoplasm of breast: Secondary | ICD-10-CM | POA: Diagnosis not present

## 2017-10-03 DIAGNOSIS — Z7689 Persons encountering health services in other specified circumstances: Secondary | ICD-10-CM | POA: Diagnosis not present
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
## 2017-10-03 NOTE — Patient Instructions (Addendum)
Chest Wall Pain °Chest wall pain is pain in or around the bones and muscles of your chest. Sometimes, an injury causes this pain. Sometimes, the cause may not be known. This pain may take several weeks or longer to get better. °Follow these instructions at home: °Pay attention to any changes in your symptoms. Take these actions to help with your pain: °· Rest as told by your doctor. °· Avoid activities that cause pain. Try not to use your chest, belly (abdominal), or side muscles to lift heavy things. °· If directed, apply ice to the painful area: °? Put ice in a plastic bag. °? Place a towel between your skin and the bag. °? Leave the ice on for 20 minutes, 2-3 times per day. °· Take over-the-counter and prescription medicines only as told by your doctor. °· Do not use tobacco products, including cigarettes, chewing tobacco, and e-cigarettes. If you need help quitting, ask your doctor. °· Keep all follow-up visits as told by your doctor. This is important. ° °Contact a doctor if: °· You have a fever. °· Your chest pain gets worse. °· You have new symptoms. °Get help right away if: °· You feel sick to your stomach (nauseous) or you throw up (vomit). °· You feel sweaty or light-headed. °· You have a cough with phlegm (sputum) or you cough up blood. °· You are short of breath. °This information is not intended to replace advice given to you by your health care provider. Make sure you discuss any questions you have with your health care provider. °Document Released: 03/21/2008 Document Revised: 03/10/2016 Document Reviewed: 12/29/2014 °Elsevier Interactive Patient Education © 2018 Else[employer] Inc. ° °

## 2018-02-07 ENCOUNTER — Other Ambulatory Visit: Payer: Self-pay | Admitting: Oncology

## 2018-02-07 DIAGNOSIS — Z853 Personal history of malignant neoplasm of breast: Secondary | ICD-10-CM

## 2018-03-19 ENCOUNTER — Ambulatory Visit
Admission: RE | Admit: 2018-03-19 | Discharge: 2018-03-19 | Disposition: A | Discharge status: Home / Self Care | Payer: 59 | Source: Ambulatory Visit | Attending: Oncology | Admitting: Oncology

## 2018-03-19 DIAGNOSIS — Z853 Personal history of malignant neoplasm of breast: Secondary | ICD-10-CM

## 2018-03-19 HISTORY — DX: Personal history of irradiation: Z92.3

## 2018-06-04 ENCOUNTER — Telehealth: Payer: Self-pay | Admitting: *Deleted

## 2018-06-04 NOTE — Telephone Encounter (Signed)
10:45 am VM message  from pt requesting clarification regarding her upcoming appts.  Returned call and was able to leave vm message on identified phone with appt date and time.  Advised pt to call back with any questions or concerns.

## 2018-06-06 ENCOUNTER — Emergency Department (HOSPITAL_COMMUNITY)
Admission: EM | Admit: 2018-06-06 | Discharge: 2018-06-06 | Disposition: A | Discharge status: Home / Self Care | Payer: 59 | Attending: Emergency Medicine | Admitting: Emergency Medicine

## 2018-06-06 ENCOUNTER — Emergency Department (HOSPITAL_COMMUNITY): Payer: 59

## 2018-06-06 ENCOUNTER — Encounter (HOSPITAL_COMMUNITY): Payer: Self-pay | Admitting: *Deleted

## 2018-06-06 DIAGNOSIS — Z87891 Personal history of nicotine dependence: Secondary | ICD-10-CM | POA: Diagnosis not present

## 2018-06-06 DIAGNOSIS — Z79899 Other long term (current) drug therapy: Secondary | ICD-10-CM | POA: Insufficient documentation

## 2018-06-06 DIAGNOSIS — M545 Low back pain, unspecified: Secondary | ICD-10-CM

## 2018-06-06 DIAGNOSIS — R109 Unspecified abdominal pain: Secondary | ICD-10-CM | POA: Diagnosis present

## 2018-06-06 LAB — COMPREHENSIVE METABOLIC PANEL
ALBUMIN: 3.5 g/dL (ref 3.5–5.0)
ALT: 13 U/L (ref 0–44)
AST: 21 U/L (ref 15–41)
Alkaline Phosphatase: 55 U/L (ref 38–126)
Anion gap: 5 (ref 5–15)
BUN: 16 mg/dL (ref 8–23)
CHLORIDE: 114 mmol/L — AB (ref 98–111)
CO2: 24 mmol/L (ref 22–32)
CREATININE: 1.05 mg/dL — AB (ref 0.44–1.00)
Calcium: 9 mg/dL (ref 8.9–10.3)
GFR calc non Af Amer: 53 mL/min — ABNORMAL LOW (ref >60)
Glucose, Bld: 96 mg/dL (ref 70–99)
Potassium: 4.2 mmol/L (ref 3.5–5.1)
SODIUM: 143 mmol/L (ref 135–145)
Total Bilirubin: 0.5 mg/dL (ref 0.3–1.2)
Total Protein: 6 g/dL — ABNORMAL LOW (ref 6.5–8.1)

## 2018-06-06 LAB — CBC
HCT: 37.3 % (ref 36.0–46.0)
HEMOGLOBIN: 11.6 g/dL — AB (ref 12.0–15.0)
MCH: 26.4 pg (ref 26.0–34.0)
MCHC: 31.1 g/dL (ref 30.0–36.0)
MCV: 84.8 fL (ref 78.0–100.0)
PLATELETS: 144 10*3/uL — AB (ref 150–400)
RBC: 4.4 MIL/uL (ref 3.87–5.11)
RDW: 13.2 % (ref 11.5–15.5)
WBC: 5.5 10*3/uL (ref 4.0–10.5)

## 2018-06-06 LAB — LIPASE, BLOOD: LIPASE: 26 U/L (ref 11–51)

## 2018-06-06 MED ORDER — KETOROLAC TROMETHAMINE 30 MG/ML IJ SOLN
30.0000 mg | Freq: Once | INTRAMUSCULAR | Status: AC
  Administered 2018-06-06: 30 mg via INTRAMUSCULAR

## 2018-06-06 MED ORDER — KETOROLAC TROMETHAMINE 30 MG/ML IJ SOLN
30.0000 mg | Freq: Once | INTRAMUSCULAR | Status: DC
  Filled 2018-06-06: qty 1

## 2018-06-06 MED ORDER — SODIUM CHLORIDE 0.9 % IV BOLUS: 1000.0000 mL | Freq: Once | INTRAVENOUS | Status: DC

## 2018-06-06 MED ORDER — ONDANSETRON HCL 4 MG/2ML IJ SOLN
4.0000 mg | Freq: Once | INTRAMUSCULAR | Status: DC
  Filled 2018-06-06: qty 2

## 2018-06-06 MED ORDER — CYCLOBENZAPRINE HCL 5 MG PO TABS: 5.0000 mg | ORAL_TABLET | Freq: Two times a day (BID) | ORAL | 0 refills | Status: AC | PRN

## 2018-06-06 NOTE — ED Notes (Signed)
Patient verbalizes understanding of discharge instructions. Opportunity for questioning and answers were provided. Pt discharged from ED. 

## 2018-06-06 NOTE — ED Notes (Signed)
Pt transported to CT ?

## 2018-06-06 NOTE — ED Triage Notes (Signed)
Pt in c/o lower back pain that radiates around into her abdomen on both sides, also nausea, no distress noted

## 2018-06-06 NOTE — Discharge Instructions (Addendum)
Your CT scan showed the following: Multilevel degenerative disc disease (L2-S1). In other words, you have some wear and tear and arthritis in your spine. This is most likely the source of your pain. Your kidneys, urinary tract, lungs and abdominal organs look ok and are unchanged from your last CT scan in 2018.  You may use Tylenol or over the counter pain relievers for your back pain. I have prescribed you Flexeril which is a muscle relaxer. This can be used if you have really sharp back pain like you did earlier today. You may find that applying a warm compress/heat pad to your back may give you additional relief too.  I recommend that you follow-up with your PCP about this if it continues to be a problem for 5-6 additional weeks.  Thank you for allowing me to take care of you today.

## 2018-06-06 NOTE — ED Notes (Signed)
Informed of the need of a urine sample

## 2018-06-06 NOTE — ED Provider Notes (Signed)
[facility] EMERGENCY DEPARTMENT Provider Note  CSN: [id number] Arrival date & time: 06/06/18  1055  History   Chief Complaint Chief Complaint  Patient presents with  . Back Pain  . Abdominal Pain    HPI [name] [name] is a 67 y.o. female with a medical history of breast cancer who presented to the ED for low back pain x2 weeks. She describes the pain as right sided flank pain that is intermittent, sharp and radiates to her right side of her abdomen. Endorses nausea. She states pain is worse with twisting to the right and does not note any relieving factors. Pain not affected palpation or walking. Denies recent falls, traumas or injuries. Denies fever, vomiting, hematuria, dysuria, changes in urine/bowel habits, paresthesias or issues with gait, balance or coordination. Patient has tried nothing prior to coming to the ED and has not sought medical treatment prior to today.  Past Medical History:  Diagnosis Date  . Arthritis    right knee  . Cancer (HCC)    breast  . Full dentures   . Personal history of radiation therapy   . Radiation    Left Breast  . Wears glasses     Patient Active Problem List   Diagnosis Date Noted  . Malignant neoplasm of lower-inner quadrant of right breast of female, estrogen receptor positive ([place]) 12/31/2013    Past Surgical History:  Procedure Laterality Date  . APPENDECTOMY    . AXILLARY SENTINEL NODE BIOPSY Right 01/29/2014   Procedure: RIGHT AXILLARY SENTINEL LYMPHNODE BIOPSY;  Surgeon: Adin Hector, MD;  Location: [facility];  Service: General;  Laterality: Right;  . BREAST LUMPECTOMY Right 12/16/2013   Procedure: LUMPECTOMY;  Surgeon: Adin Hector, MD;  Location: [facility];  Service: General;  Laterality: Right;  . TUBAL LIGATION       OB History   None      Home Medications    Prior to Admission medications   Medication Sig Start Date End Date Taking? Authorizing Provider   naproxen (NAPROSYN) 375 MG tablet Take 1 tablet (375 mg total) by mouth 2 (two) times daily. 09/11/17  Yes Mabe, Shanon Brow, NP  tamoxifen (NOLVADEX) 20 MG tablet Take 1 tablet (20 mg total) by mouth daily. Pt is out of town and forgot her pills. Patient taking differently: Take 20 mg by mouth daily.  07/21/17  Yes Magrinat, Virgie Dad, MD  cyclobenzaprine (FLEXERIL) 5 MG tablet Take 1 tablet (5 mg total) by mouth 2 (two) times daily as needed for up to 10 days for muscle spasms. 06/06/18 06/16/18  Claudean Leavelle, Jonelle Sports, PA-C    Family History Family History  Problem Relation Age of Onset  . Hypertension Sister   . Hypertension Brother   . Breast cancer Neg Hx     Social History Social History   Tobacco Use  . Smoking status: Former Smoker    Last attempt to quit: 12/10/1990    Years since quitting: 27.5  . Smokeless tobacco: Never Used  Substance Use Topics  . Alcohol use: No  . Drug use: No     Allergies   Peanut-containing drug products and Codeine   Review of Systems Review of Systems  Constitutional: Negative for chills, fatigue and fever.  Respiratory: Negative.   Cardiovascular: Negative.   Gastrointestinal: Positive for abdominal pain and nausea. Negative for constipation, diarrhea and vomiting.  Genitourinary: Positive for flank pain. Negative for decreased urine volume, dysuria, frequency, hematuria, pelvic  pain, urgency and vaginal pain.  Musculoskeletal: Positive for back pain. Negative for gait problem and joint swelling.  Skin: Negative.   Neurological: Negative for weakness and numbness.  Hematological: Negative.     Physical Exam Updated Vital Signs BP (!) 147/61 (BP Location: Left Arm)   Pulse 63   Temp 98.9 F (37.2 C) (Oral)   Resp 15   SpO2 100%   Physical Exam  Constitutional: She appears well-developed and well-nourished.  Patient lying comfortably on bed  HENT:  Head: Normocephalic and atraumatic.  Cardiovascular: Normal rate, regular rhythm and  normal heart sounds.  Pulmonary/Chest: Effort normal and breath sounds normal.  Abdominal: Soft. Normal appearance and bowel sounds are normal. There is no tenderness. There is CVA tenderness. No hernia.  Right CVA tenderness.  Musculoskeletal: Normal range of motion.       Back:  Left side lumbar muscular tenderness. No midline tenderness of spine. Full ROM of upper and lower extremities bilaterally with 5/5 strength.  Skin: Skin is warm. She is not diaphoretic. No pallor.  Nursing note and vitals reviewed.  ED Treatments / Results  Labs (all labs ordered are listed, but only abnormal results are displayed) Labs Reviewed  COMPREHENSIVE METABOLIC PANEL - Abnormal; Notable for the following components:      Result Value   Chloride 114 (*)    Creatinine, Ser 1.05 (*)    Total Protein 6.0 (*)    GFR calc non Af Amer 53 (*)    All other components within normal limits  CBC - Abnormal; Notable for the following components:   Hemoglobin 11.6 (*)    Platelets 144 (*)    All other components within normal limits  LIPASE, BLOOD  URINALYSIS, ROUTINE W REFLEX MICROSCOPIC    EKG None  Radiology Ct Renal Stone Study  Result Date: 06/06/2018 CLINICAL DATA:  Low back pain radiating to the right flank with nausea EXAM: CT ABDOMEN AND PELVIS WITHOUT CONTRAST TECHNIQUE: Multidetector CT imaging of the abdomen and pelvis was performed following the standard protocol without IV contrast. COMPARISON:  09/05/2017 CT FINDINGS: Lower chest: Top-normal heart size. Moderate-sized hiatal hernia. Minimal dependent bibasilar atelectasis. Hepatobiliary: Unremarkable unenhanced appearance of the liver. Normal gallbladder without stones. No biliary dilatation. Pancreas: The cystic focus in the body of the pancreas seen on the enhanced CT study is not apparent on the unenhanced exam. No ductal dilatation or inflammation. Spleen: Normal Adrenals/Urinary Tract: Normal bilateral adrenal glands. No nephrolithiasis.  No hydroureteronephrosis. The urinary bladder is unremarkable for degree of distention. Stomach/Bowel: Stomach is within normal limits. Appendix reported as surgically absent. No evidence of bowel wall thickening, distention, or inflammatory changes. Vascular/Lymphatic: No significant vascular findings are present. No enlarged abdominal or pelvic lymph nodes. Reproductive: Stable prominence in the region of the left adnexa contiguous with what appears to be the uterus and possibly representing a large exophytic fibroid. This could be further correlated with ultrasound or MRI. This measures approximately 5 x 4.9 cm versus 4.8 x 4.6 cm allowing for operator dependent imaging differences, series 3/71 of current exam. There may be an additional similar attenuating intramural fibroid in the body of the uterus measuring 2.7 x 1.6 cm, series 3/68. Otherwise, the uterus and right ovary are unremarkable. Other: No abdominal wall hernia or abnormality. No abdominopelvic ascites. Musculoskeletal: Intramuscular lipoma of the left sartorius partially visualized. Moderate disc flattening of L2-3, L4-5 and L5-S1 with grade 1 anterolisthesis of L5 on S1 likely on the basis of degenerative disc and  facet arthropathy. Lumbar facet hypertrophy and sclerosis with joint space narrowing greatest at L5-S1. No focal disc herniations. IMPRESSION: 1. Lumbar spondylosis with multilevel degenerative disc disease and facet arthropathy. 2. No obstructive uropathy.  No hydroureteronephrosis. 3. Suboptimal visualization of previously noted 8 mm cystic focus in the pancreatic body on this unenhanced study. 4. Prominent soft tissue density in the left adnexa as before possibly representing a large exophytic uterine fibroid. Smaller intramural fibroid is also suggested on left. These can be further evaluated with ultrasound or MRI if not previously documented/evaluated elsewhere. Electronically Signed   By: Ashley Royalty M.D.   On: 06/06/2018 17:54     Procedures Procedures (including critical care time)  Medications Ordered in ED Medications  ketorolac (TORADOL) 30 MG/ML injection 30 mg (has no administration in time range)     Initial Impression / Assessment and Plan / ED Course  Triage vital signs and the nursing notes have been reviewed.  Pertinent labs & imaging results that were available during care of the patient were reviewed and considered in medical decision making (see chart for details).  Patient presents afebrile with remaining vital signs normal for right flank and low back pain. Physical exam is concerning for nephrolithiasis given right side CVA tenderness in the absence of no other abnormalities on abdominal exam or significant abdominal complaints in the history. Patient has no abdominal tenderness, changes in urinary/bowel habits or appetite that raise concern for an intra-abdominal or GU process. Prelim labs ordered assist in ruling out acute GI issues such as appendicitis, pancreatitis or perforation. Will order CT stone study to evaluate for nephrolithiasis and to provide more information on other abdominal and pelvic structures.  Clinical Course as of Jun 06 1832  Wed Jun 06, 2018  1649 Prelim labs unremarkable. Anemic at 11.6 on CBC which is consistent with her baseline Hgb levels.   [GM]  1722 IV fluids, pain and antiemetic ordered.   [GM]  7867 CT stone study resulted. No nephrolithiasis or urinary tract obstruction. Lumbar spondylosis is noted which is likely the primary contributed to pain, especially given that pain is worse with movement. Other findings include fibroids and 71mm lesion on pancreas which are unchanged from patient's last CT abdomen/pelvis in 08/2017.   [GM]    Clinical Course User Index [GM] Adell Panek, Alvie Heidelberg I, PA-C   Patient's low back and flank pain are MSK in nature. Significant degenerative and spondylotic changes are seen from L2-S1. Patient does not have any lower extremity  paresthesias, weakness, saddle anesthesia or urinary/bowel dysfunction that raises concern for acute spinal cord pathology.  Final Clinical Impressions(s) / ED Diagnoses  1. Low Back Pain. MSK etiology. Lumbar degeneration and spondylosis from L2-S1. Education provided on OTC and supportive treatment for pain relief. IM Toradol 30mg  x1 given in the ED. Prescribed Flexeril 5mg  for muscle spasms. Advised to follow-up with PCP.  Dispo: Home. After thorough clinical evaluation, this patient is determined to be medically stable and can be safely discharged with the previously mentioned treatment and/or outpatient follow-up/referral(s). At this time, there are no other apparent medical conditions that require further screening, evaluation or treatment.   Final diagnoses:  Acute right-sided low back pain without sciatica    ED Discharge Orders         Ordered    cyclobenzaprine (FLEXERIL) 5 MG tablet  2 times daily PRN     06/06/18 1832            Maury Dus I, PA-C 06/06/18  0165    Milton Ferguson, MD 06/06/18 2329

## 2018-06-13 ENCOUNTER — Other Ambulatory Visit: Payer: Self-pay

## 2018-06-13 DIAGNOSIS — C50311 Malignant neoplasm of lower-inner quadrant of right female breast: Secondary | ICD-10-CM

## 2018-06-13 DIAGNOSIS — Z170 Estrogen receptor positive status [ER+]: Principal | ICD-10-CM
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
## 2018-06-14 ENCOUNTER — Inpatient Hospital Stay: Payer: 59 | Attending: Oncology | Admitting: Oncology

## 2018-06-14 ENCOUNTER — Telehealth: Payer: Self-pay | Admitting: Oncology

## 2018-06-14 ENCOUNTER — Inpatient Hospital Stay: Payer: 59

## 2018-06-14 VITALS — BP 150/78 | HR 78 | Temp 97.7°F | Resp 20 | Ht 62.25 in | Wt 175.5 lb

## 2018-06-14 DIAGNOSIS — C50411 Malignant neoplasm of upper-outer quadrant of right female breast: Secondary | ICD-10-CM | POA: Diagnosis not present

## 2018-06-14 DIAGNOSIS — Z170 Estrogen receptor positive status [ER+]: Secondary | ICD-10-CM | POA: Diagnosis not present

## 2018-06-14 DIAGNOSIS — Z923 Personal history of irradiation: Secondary | ICD-10-CM | POA: Diagnosis not present

## 2018-06-14 DIAGNOSIS — C50311 Malignant neoplasm of lower-inner quadrant of right female breast: Secondary | ICD-10-CM

## 2018-06-14 DIAGNOSIS — Z79810 Long term (current) use of selective estrogen receptor modulators (SERMs): Secondary | ICD-10-CM | POA: Diagnosis not present

## 2018-06-14 LAB — CMP (CANCER CENTER ONLY)
ALT: 11 U/L (ref 0–44)
ANION GAP: 8 (ref 5–15)
AST: 18 U/L (ref 15–41)
Albumin: 3.7 g/dL (ref 3.5–5.0)
Alkaline Phosphatase: 68 U/L (ref 38–126)
BUN: 17 mg/dL (ref 8–23)
CHLORIDE: 108 mmol/L (ref 98–111)
CO2: 25 mmol/L (ref 22–32)
Calcium: 9.5 mg/dL (ref 8.9–10.3)
Creatinine: 0.99 mg/dL (ref 0.44–1.00)
GFR, EST NON AFRICAN AMERICAN: 57 mL/min — AB (ref >60)
Glucose, Bld: 100 mg/dL — ABNORMAL HIGH (ref 70–99)
POTASSIUM: 4.1 mmol/L (ref 3.5–5.1)
Sodium: 141 mmol/L (ref 135–145)
Total Bilirubin: 0.3 mg/dL (ref 0.3–1.2)
Total Protein: 6.6 g/dL (ref 6.5–8.1)

## 2018-06-14 LAB — CBC WITH DIFFERENTIAL (CANCER CENTER ONLY)
BASOS ABS: 0 10*3/uL (ref 0.0–0.1)
Basophils Relative: 0 %
Eosinophils Absolute: 0.1 10*3/uL (ref 0.0–0.5)
Eosinophils Relative: 2 %
HCT: 34.7 % — ABNORMAL LOW (ref 34.8–46.6)
HEMOGLOBIN: 11.5 g/dL — AB (ref 11.6–15.9)
LYMPHS ABS: 1.6 10*3/uL (ref 0.9–3.3)
LYMPHS PCT: 29 %
MCH: 26.8 pg (ref 25.1–34.0)
MCHC: 33.1 g/dL (ref 31.5–36.0)
MCV: 80.9 fL (ref 79.5–101.0)
Monocytes Absolute: 0.4 10*3/uL (ref 0.1–0.9)
Monocytes Relative: 8 %
NEUTROS PCT: 61 %
Neutro Abs: 3.4 10*3/uL (ref 1.5–6.5)
Platelet Count: 124 10*3/uL — ABNORMAL LOW (ref 145–400)
RBC: 4.29 MIL/uL (ref 3.70–5.45)
RDW: 13.8 % (ref 11.2–14.5)
WBC: 5.6 10*3/uL (ref 3.9–10.3)

## 2018-06-14 NOTE — Telephone Encounter (Signed)
Gave avs and calendar ° °

## 2018-09-02 ENCOUNTER — Other Ambulatory Visit: Payer: Self-pay | Admitting: Oncology

## 2018-09-02 DIAGNOSIS — C50311 Malignant neoplasm of lower-inner quadrant of right female breast: Secondary | ICD-10-CM

## 2018-10-13 ENCOUNTER — Emergency Department (HOSPITAL_COMMUNITY): Payer: 59

## 2018-10-13 ENCOUNTER — Emergency Department (HOSPITAL_COMMUNITY)
Admission: EM | Admit: 2018-10-13 | Discharge: 2018-10-13 | Disposition: A | Discharge status: Home / Self Care | Payer: 59 | Attending: Emergency Medicine | Admitting: Emergency Medicine

## 2018-10-13 DIAGNOSIS — Z853 Personal history of malignant neoplasm of breast: Secondary | ICD-10-CM | POA: Diagnosis not present

## 2018-10-13 DIAGNOSIS — C50311 Malignant neoplasm of lower-inner quadrant of right female breast: Secondary | ICD-10-CM | POA: Insufficient documentation

## 2018-10-13 DIAGNOSIS — Z91010 Allergy to peanuts: Secondary | ICD-10-CM | POA: Diagnosis not present

## 2018-10-13 DIAGNOSIS — Z87891 Personal history of nicotine dependence: Secondary | ICD-10-CM | POA: Diagnosis not present

## 2018-10-13 DIAGNOSIS — Z170 Estrogen receptor positive status [ER+]: Secondary | ICD-10-CM | POA: Diagnosis not present

## 2018-10-13 DIAGNOSIS — L03115 Cellulitis of right lower limb: Secondary | ICD-10-CM | POA: Diagnosis not present

## 2018-10-13 DIAGNOSIS — M79671 Pain in right foot: Secondary | ICD-10-CM | POA: Diagnosis present

## 2018-10-13 MED ORDER — SULFAMETHOXAZOLE-TRIMETHOPRIM 800-160 MG PO TABS
1.0000 | ORAL_TABLET | Freq: Once | ORAL | Status: AC
  Administered 2018-10-13: 1 via ORAL
  Filled 2018-10-13: qty 1

## 2018-10-13 MED ORDER — SULFAMETHOXAZOLE-TRIMETHOPRIM 800-160 MG PO TABS: 1.0000 | ORAL_TABLET | Freq: Two times a day (BID) | ORAL | 0 refills | Status: AC

## 2018-10-13 NOTE — Discharge Instructions (Addendum)
You have an area of infected skin called cellulitis.  I have started you on antibiotics.  These antibiotics do not work instantaneously.   - You should allow at least 48 hours after your first dose for the cellulitis to slow it's spreading.   - You should allow 48 hours after the first dose to see the cellulitis to start improving.   - These things may happen quicker than that but allow at least these limitations prior to returning to the emergency department or your primary doctor.   - The exceptions to this are if you become systemically ill such as having a fever, nausea, vomiting, malaise.  - The other exception is if you have a rapid spreading of the cellulitis or the redness and swelling and pain.  If you notice more than a couple inches of spreading over a couple hours you need to return to the emergency department immediately because this could be a life-threatening infection. If you notice red streaking from the site you also need to return for reevaluation.  - If not, follow up with your primary doctor as instructed for reevaluation.   

## 2018-10-13 NOTE — ED Notes (Signed)
Pt in xray

## 2018-10-13 NOTE — ED Provider Notes (Signed)
Emergency Department Provider Note   I have reviewed the triage vital signs and the nursing notes.   HISTORY  Chief Complaint Foot Pain   HPI [name] [name] is a 67 y.o. female who presents the emergency department today with 2 to 3 days of right dorsal foot pain.  atraumatic.  Worse with ambulation worse with touch.  Never writhing as before.  No lower extremity swelling.  No shortness of breath or chest pain.  No fever, nausea, vomiting, malaise diarrhea or constipation.  No other associated symptoms.  Has not tried anything at home for symptoms.  Has not seen anyone else for the same symptoms. No other associated or modifying symptoms.    Past Medical History:  Diagnosis Date  . Arthritis    right knee  . Cancer (HCC)    breast  . Full dentures   . Personal history of radiation therapy   . Radiation    Left Breast  . Wears glasses     Patient Active Problem List   Diagnosis Date Noted  . Malignant neoplasm of lower-inner quadrant of right breast of female, estrogen receptor positive ([facility]) 12/31/2013    Past Surgical History:  Procedure Laterality Date  . APPENDECTOMY    . AXILLARY SENTINEL NODE BIOPSY Right 01/29/2014   Procedure: RIGHT AXILLARY SENTINEL LYMPHNODE BIOPSY;  Surgeon: Adin Hector, MD;  Location: [facility];  Service: General;  Laterality: Right;  . BREAST LUMPECTOMY Right 12/16/2013   Procedure: LUMPECTOMY;  Surgeon: Adin Hector, MD;  Location: [facility];  Service: General;  Laterality: Right;  . TUBAL LIGATION      Current Outpatient Rx  . Order #: [id number] Class: Normal  . Order #: [id number] Class: Normal  . Order #: [id number] Class: Normal  . Order #: [id number] Class: Normal    Allergies Peanut-containing drug products and Codeine  Family History  Problem Relation Age of Onset  . Hypertension Sister   . Hypertension Brother   . Breast cancer Neg Hx     Social History Social History    Tobacco Use  . Smoking status: Former Smoker    Last attempt to quit: 12/10/1990    Years since quitting: 27.8  . Smokeless tobacco: Never Used  Substance Use Topics  . Alcohol use: No  . Drug use: No    Review of Systems  All other systems negative except as documented in the HPI. All pertinent positives and negatives as reviewed in the HPI. ____________________________________________   PHYSICAL EXAM:  VITAL SIGNS: ED Triage Vitals [10/13/18 0937]  Enc Vitals Group     BP 122/70     Pulse Rate 79     Resp 16     Temp 98.3 F (36.8 C)     Temp Source Oral     SpO2 100 %     Weight      Height      Head Circumference      Peak Flow      Pain Score      Pain Loc      Pain Edu?      Excl. in [place]?     Constitutional: Alert and oriented. Well appearing and in no acute distress. Eyes: Conjunctivae are normal. PERRL. EOMI. Head: Atraumatic. Nose: No congestion/rhinnorhea. Mouth/Throat: Mucous membranes are moist.  Oropharynx non-erythematous. Neck: No stridor.  No meningeal signs.   Cardiovascular: Normal rate, regular rhythm. Good peripheral circulation. Grossly normal heart sounds.   Respiratory: Normal respiratory  effort.  No retractions. Lungs CTAB. Gastrointestinal: Soft and nontender. No distention.  Musculoskeletal: No lower extremity tenderness nor edema. No gross deformities of extremities. Neurologic:  Normal speech and language. No gross focal neurologic deficits are appreciated.  Skin:  Skin is warm, dry and intact.  4 x 7 cm area of erythema, swelling and tenderness and warmth to the dorsal foot of her right side.  No bony pain.  No lower extremity swelling.   ____________________________________________    INITIAL IMPRESSION / ASSESSMENT AND PLAN / ED COURSE  Suspect cellulitis.  Will start Bactrim.  Patient does not anything else for pain.  We will do a postop shoe.   Pertinent labs & imaging results that were available during my care of the  patient were reviewed by me and considered in my medical decision making (see chart for details).  ____________________________________________  FINAL CLINICAL IMPRESSION(S) / ED DIAGNOSES  Final diagnoses:  Cellulitis of right lower extremity     MEDICATIONS GIVEN DURING THIS VISIT:  Medications  sulfamethoxazole-trimethoprim (BACTRIM DS,SEPTRA DS) 800-160 MG per tablet 1 tablet (1 tablet Oral Given 10/13/18 1038)     NEW OUTPATIENT MEDICATIONS STARTED DURING THIS VISIT:  New Prescriptions   SULFAMETHOXAZOLE-TRIMETHOPRIM (BACTRIM DS,SEPTRA DS) 800-160 MG TABLET    Take 1 tablet by mouth 2 (two) times daily for 7 days.    Note:  This note was prepared with assistance of Dragon voice recognition software. Occasional wrong-word or sound-a-like substitutions may have occurred due to the inherent limitations of voice recognition software.   Merrily Pew, MD 10/13/18 1050

## 2018-10-13 NOTE — ED Triage Notes (Signed)
Pt endorses right foot pain x 2 days. Denies injury.

## 2018-10-19 ENCOUNTER — Encounter: Payer: Self-pay | Admitting: Family Medicine

## 2018-10-19 ENCOUNTER — Ambulatory Visit (INDEPENDENT_AMBULATORY_CARE_PROVIDER_SITE_OTHER): Payer: 59 | Admitting: Family Medicine

## 2018-10-19 VITALS — BP 106/62 | HR 72 | Temp 98.1°F | Wt 171.0 lb

## 2018-10-19 DIAGNOSIS — Z13220 Encounter for screening for lipoid disorders: Secondary | ICD-10-CM

## 2018-10-19 DIAGNOSIS — Z0000 Encounter for general adult medical examination without abnormal findings: Secondary | ICD-10-CM | POA: Diagnosis not present

## 2018-10-19 DIAGNOSIS — Z131 Encounter for screening for diabetes mellitus: Secondary | ICD-10-CM

## 2018-10-19 DIAGNOSIS — R6889 Other general symptoms and signs: Secondary | ICD-10-CM | POA: Diagnosis not present

## 2018-10-19 DIAGNOSIS — H6123 Impacted cerumen, bilateral: Secondary | ICD-10-CM

## 2018-10-19 DIAGNOSIS — R209 Unspecified disturbances of skin sensation: Secondary | ICD-10-CM

## 2018-10-19 DIAGNOSIS — M7918 Myalgia, other site: Secondary | ICD-10-CM

## 2018-10-19 LAB — T4, FREE: FREE T4: 0.74 ng/dL (ref 0.60–1.60)

## 2018-10-19 LAB — CBC WITH DIFFERENTIAL/PLATELET
BASOS ABS: 0.1 10*3/uL (ref 0.0–0.1)
Basophils Relative: 1.1 % (ref 0.0–3.0)
EOS ABS: 0.1 10*3/uL (ref 0.0–0.7)
Eosinophils Relative: 1.7 % (ref 0.0–5.0)
HCT: 39.1 % (ref 36.0–46.0)
Hemoglobin: 12.8 g/dL (ref 12.0–15.0)
LYMPHS ABS: 1.6 10*3/uL (ref 0.7–4.0)
LYMPHS PCT: 30.5 % (ref 12.0–46.0)
MCHC: 32.8 g/dL (ref 30.0–36.0)
MCV: 81.9 fl (ref 78.0–100.0)
Monocytes Absolute: 0.5 10*3/uL (ref 0.1–1.0)
Monocytes Relative: 8.6 % (ref 3.0–12.0)
NEUTROS ABS: 3.1 10*3/uL (ref 1.4–7.7)
NEUTROS PCT: 58.1 % (ref 43.0–77.0)
PLATELETS: 160 10*3/uL (ref 150.0–400.0)
RBC: 4.78 Mil/uL (ref 3.87–5.11)
RDW: 13.9 % (ref 11.5–15.5)
WBC: 5.3 10*3/uL (ref 4.0–10.5)

## 2018-10-19 LAB — BASIC METABOLIC PANEL
BUN: 21 mg/dL (ref 6–23)
CHLORIDE: 104 meq/L (ref 96–112)
CO2: 25 mEq/L (ref 19–32)
Calcium: 9.6 mg/dL (ref 8.4–10.5)
Creatinine, Ser: 1.1 mg/dL (ref 0.40–1.20)
GFR: 63.3 mL/min (ref >60.00)
Glucose, Bld: 84 mg/dL (ref 70–99)
POTASSIUM: 4.6 meq/L (ref 3.5–5.1)
Sodium: 137 mEq/L (ref 135–145)

## 2018-10-19 LAB — LIPID PANEL
CHOLESTEROL: 171 mg/dL (ref 0–200)
HDL: 45.1 mg/dL (ref >39.00)
LDL CALC: 92 mg/dL (ref 0–99)
NonHDL: 125.85
TRIGLYCERIDES: 170 mg/dL — AB (ref 0.0–149.0)
Total CHOL/HDL Ratio: 4
VLDL: 34 mg/dL (ref 0.0–40.0)

## 2018-10-19 LAB — HEMOGLOBIN A1C: Hgb A1c MFr Bld: 6.2 % (ref 4.6–6.5)

## 2018-10-19 LAB — TSH: TSH: 3.84 u[IU]/mL (ref 0.35–4.50)

## 2018-10-19 NOTE — Patient Instructions (Signed)
Preventive Care 70 Years and Older, Female Preventive care refers to lifestyle choices and visits with your health care provider that can promote health and wellness. What does preventive care include?  A yearly physical exam. This is also called an annual well check.  Dental exams once or twice a year.  Routine eye exams. Ask your health care provider how often you should have your eyes checked.  Personal lifestyle choices, including: ? Daily care of your teeth and gums. ? Regular physical activity. ? Eating a healthy diet. ? Avoiding tobacco and drug use. ? Limiting alcohol use. ? Practicing safe sex. ? Taking low-dose aspirin every day. ? Taking vitamin and mineral supplements as recommended by your health care provider. What happens during an annual well check? The services and screenings done by your health care provider during your annual well check will depend on your age, overall health, lifestyle risk factors, and family history of disease. Counseling Your health care provider may ask you questions about your:  Alcohol use.  Tobacco use.  Drug use.  Emotional well-being.  Home and relationship well-being.  Sexual activity.  Eating habits.  History of falls.  Memory and ability to understand (cognition).  Work and work Statistician.  Reproductive health.  Screening You may have the following tests or measurements:  Height, weight, and BMI.  Blood pressure.  Lipid and cholesterol levels. These may be checked every 5 years, or more frequently if you are over 30 years old.  Skin check.  Lung cancer screening. You may have this screening every year starting at age 27 if you have a 30-pack-year history of smoking and currently smoke or have quit within the past 15 years.  Colorectal cancer screening. All adults should have this screening starting at age 33 and continuing until age 46. You will have tests every 1-10 years, depending on your results and the  type of screening test. People at increased risk should start screening at an earlier age. Screening tests may include: ? Guaiac-based fecal occult blood testing. ? Fecal immunochemical test (FIT). ? Stool DNA test. ? Virtual colonoscopy. ? Sigmoidoscopy. During this test, a flexible tube with a tiny camera (sigmoidoscope) is used to examine your rectum and lower colon. The sigmoidoscope is inserted through your anus into your rectum and lower colon. ? Colonoscopy. During this test, a long, thin, flexible tube with a tiny camera (colonoscope) is used to examine your entire colon and rectum.  Hepatitis C blood test.  Hepatitis B blood test.  Sexually transmitted disease (STD) testing.  Diabetes screening. This is done by checking your blood sugar (glucose) after you have not eaten for a while (fasting). You may have this done every 1-3 years.  Bone density scan. This is done to screen for osteoporosis. You may have this done starting at age 37.  Mammogram. This may be done every 1-2 years. Talk to your health care provider about how often you should have regular mammograms. Talk with your health care provider about your test results, treatment options, and if necessary, the need for more tests. Vaccines Your health care provider may recommend certain vaccines, such as:  Influenza vaccine. This is recommended every year.  Tetanus, diphtheria, and acellular pertussis (Tdap, Td) vaccine. You may need a Td booster every 10 years.  Varicella vaccine. You may need this if you have not been vaccinated.  Zoster vaccine. You may need this after age 38.  Measles, mumps, and rubella (MMR) vaccine. You may need at least  one dose of MMR if you were born in 1957 or later. You may also need a second dose.  Pneumococcal 13-valent conjugate (PCV13) vaccine. One dose is recommended after age 32.  Pneumococcal polysaccharide (PPSV23) vaccine. One dose is recommended after age 56.  Meningococcal  vaccine. You may need this if you have certain conditions.  Hepatitis A vaccine. You may need this if you have certain conditions or if you travel or work in places where you may be exposed to hepatitis A.  Hepatitis B vaccine. You may need this if you have certain conditions or if you travel or work in places where you may be exposed to hepatitis B.  Haemophilus influenzae type b (Hib) vaccine. You may need this if you have certain conditions. Talk to your health care provider about which screenings and vaccines you need and how often you need them. This information is not intended to replace advice given to you by your health care provider. Make sure you discuss any questions you have with your health care provider. Document Released: 10/30/2015 Document Revised: 11/23/2017 Document Reviewed: 08/04/2015 Elsevier Interactive Patient Education  2019 [place], Adult The ears produce a substance called earwax that helps keep bacteria out of the ear and protects the skin in the ear canal. Occasionally, earwax can build up in the ear and cause discomfort or hearing loss. What increases the risk? This condition is more likely to develop in people who:  Are female.  Are elderly.  Naturally produce more earwax.  Clean their ears often with cotton swabs.  Use earplugs often.  Use in-ear headphones often.  Wear hearing aids.  Have narrow ear canals.  Have earwax that is overly thick or sticky.  Have eczema.  Are dehydrated.  Have excess hair in the ear canal. What are the signs or symptoms? Symptoms of this condition include:  Reduced or muffled hearing.  A feeling of fullness in the ear or feeling that the ear is plugged.  Fluid coming from the ear.  Ear pain.  Ear itch.  Ringing in the ear.  Coughing.  An obvious piece of earwax that can be seen inside the ear canal. How is this diagnosed? This condition may be diagnosed based on:  Your  symptoms.  Your medical history.  An ear exam. During the exam, your health care provider will look into your ear with an instrument called an otoscope. You may have tests, including a hearing test. How is this treated? This condition may be treated by:  Using ear drops to soften the earwax.  Having the earwax removed by a health care provider. The health care provider may: ? Flush the ear with water. ? Use an instrument that has a loop on the end (curette). ? Use a suction device.  Surgery to remove the wax buildup. This may be done in severe cases. Follow these instructions at home:   Take over-the-counter and prescription medicines only as told by your health care provider.  Do not put any objects, including cotton swabs, into your ear. You can clean the opening of your ear canal with a washcloth or facial tissue.  Follow instructions from your health care provider about cleaning your ears. Do not over-clean your ears.  Drink enough fluid to keep your urine clear or pale yellow. This will help to thin the earwax.  Keep all follow-up visits as told by your health care provider. If earwax builds up in your ears often or if you  use hearing aids, consider seeing your health care provider for routine, preventive ear cleanings. Ask your health care provider how often you should schedule your cleanings.  If you have hearing aids, clean them according to instructions from the manufacturer and your health care provider. Contact a health care provider if:  You have ear pain.  You develop a fever.  You have blood, pus, or other fluid coming from your ear.  You have hearing loss.  You have ringing in your ears that does not go away.  Your symptoms do not improve with treatment.  You feel like the room is spinning (vertigo). Summary  Earwax can build up in the ear and cause discomfort or hearing loss.  The most common symptoms of this condition include reduced or muffled  hearing and a feeling of fullness in the ear or feeling that the ear is plugged.  This condition may be diagnosed based on your symptoms, your medical history, and an ear exam.  This condition may be treated by using ear drops to soften the earwax or by having the earwax removed by a health care provider.  Do not put any objects, including cotton swabs, into your ear. You can clean the opening of your ear canal with a washcloth or facial tissue. This information is not intended to replace advice given to you by your health care provider. Make sure you discuss any questions you have with your health care provider. Document Released: 11/10/2004 Document Revised: 09/14/2017 Document Reviewed: 12/14/2016 Elsevier Interactive Patient Education  2019 [employer].
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
## 2018-10-21 ENCOUNTER — Encounter: Payer: Self-pay | Admitting: Family Medicine

## 2018-10-31 ENCOUNTER — Telehealth: Payer: Self-pay | Admitting: Family Medicine

## 2018-10-31 NOTE — Telephone Encounter (Signed)
Returned call to pt. To discuss labs; see result note.

## 2018-10-31 NOTE — Telephone Encounter (Signed)
Pt returned call for lab results  Copied from [place] [id number]. Topic: Quick Communication - Lab Results (Clinic Use ONLY) >> Oct 29, 2018  1:56 PM [place], Andee Poles, CMA wrote: Called patient to inform them of  lab results. When patient returns call, triage nurse may disclose results.

## 2019-04-17 ENCOUNTER — Telehealth: Payer: Self-pay | Admitting: Oncology

## 2019-04-17 NOTE — Telephone Encounter (Signed)
Faxed medical records for last 5 years to [facility] @ [phone]

## 2019-04-22 ENCOUNTER — Ambulatory Visit: Payer: 59 | Admitting: Family Medicine

## 2019-06-03 ENCOUNTER — Other Ambulatory Visit: Payer: Self-pay | Admitting: Oncology

## 2019-06-03 DIAGNOSIS — Z853 Personal history of malignant neoplasm of breast: Secondary | ICD-10-CM

## 2019-06-05 ENCOUNTER — Other Ambulatory Visit: Payer: Self-pay

## 2019-06-05 DIAGNOSIS — Z20822 Contact with and (suspected) exposure to covid-19: Secondary | ICD-10-CM

## 2019-06-06 LAB — NOVEL CORONAVIRUS, NAA: SARS-CoV-2, NAA: NOT DETECTED

## 2019-06-11 ENCOUNTER — Ambulatory Visit
Admission: RE | Admit: 2019-06-11 | Discharge: 2019-06-11 | Disposition: A | Discharge status: Home / Self Care | Payer: 59 | Source: Ambulatory Visit | Attending: Oncology | Admitting: Oncology

## 2019-06-11 ENCOUNTER — Other Ambulatory Visit: Payer: Self-pay

## 2019-06-11 DIAGNOSIS — Z853 Personal history of malignant neoplasm of breast: Secondary | ICD-10-CM

## 2019-06-15 NOTE — Progress Notes (Signed)
[place]  [phone] Fax:([phone]) [phone]   ID: [name] [name] DOB: 1950/11/25  MR#: [id number]  TFT#:[id number]  Patient Care Team: System, Pcp Not In as PCP - General , [name] Dad, MD as Consulting Physician (Oncology) PCP: System, Pcp Not In GYN: [name] Racer MD SU: [name] Skates MD OTHER MD: [name] Silversmith MD  CHIEF COMPLAINT: Estrogen receptor positive breast [name]  CURRENT TREATMENT: Completing 5 years of tamoxifen   BREAST [name] HISTORY: From doctor [name] [name] 01/28/2014 intake nodes:   "[name] [name] is a 68 y.o. female. Has not had a mammogram in about 12 years. Approximately in December patient felt a mass in the lateral right breast. She does tell me that she has occasionally had some pain. She had mammogram performed that showed a 6 cm irregular mass in the upper outer quadrant of the right breast. Ultrasound of the right axilla was normal. The left breast showed a benign lymph node at the 3:00 position. Patient underwent a biopsy of the right breast mass that showed atypical papillary lesion. Because of this she was seen by Dr. Fanny [name]. He recommended a lumpectomy and removal of this lesion. Patient went on to have a lumpectomy performed on 11/27/2013. 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 Margins were negative. Tumor was ER +100% PR +100% HER-2/neu negative with a proliferation marker Ki-67 8%. She went on to have MRI of the breasts performed that were negative. Patient's case was discussed at the multidisciplinary breast conference. She was recommended further workup including doing sentinel lymph node."  Her subsequent history is as detailed below.   INTERVAL HISTORY: [name] [name] returns today for follow-up and treatment of her estrogen receptor positive breast [name]. She was last seen here on 06/14/2018.   She continues on tamoxifen.  She tolerates this well, with  no significant hot flashes or vaginal wetness problems  Since her last visit here, she underwent a right foot xray on 10/14/2019 for pain showing: No acute bone abnormality to the right foot. Osteoarthritis at the first MTP joint.    She also underwent a digital diagnostic bilateral mammogram with tomography on 06/11/2019 showing: Breast Density Category B. There is no mammographic evidence of malignancy.    REVIEW OF SYSTEMS: [name] [name] is currently on for low.  She walks around the block every morning and tries to keep as busy as possible.  She is taking appropriate pandemic precautions.  She has pain in the right chest wall area, beneath the breast, at the anterior axillary line, which is very bothersome to her particularly when she turns to the right.  She is very concerned about this.  It is not associated with shortness of breath or pain with deep breathing.  A detailed review of systems today was otherwise noncontributory   PAST MEDICAL HISTORY: Past Medical History:  Diagnosis Date  . Arthritis    right knee  . [name] (HCC)    breast  . Full dentures   . Personal history of radiation therapy   . Radiation    Left Breast  . Wears glasses     PAST SURGICAL HISTORY: Past Surgical History:  Procedure Laterality Date  . APPENDECTOMY    . AXILLARY SENTINEL NODE BIOPSY Right 01/29/2014   Procedure: RIGHT AXILLARY SENTINEL LYMPHNODE BIOPSY;  Surgeon: [name] Hector, MD;  Location: [place];  Service: General;  Laterality: Right;  . BREAST LUMPECTOMY Right 12/16/2013   Procedure: LUMPECTOMY;  Surgeon: [name] [name]  [name] Grove, MD;  Location: [place];  Service: General;  Laterality: Right;  . TUBAL LIGATION      FAMILY HISTORY Family History  Problem Relation Age of Onset  . Hypertension Sister   . Hypertension Brother   . Breast [name] Neg Hx    the patient's father died at the age of 18 from [name], but the patient does not know the primary site. The  patient's mother died "of natural causes" at age 24. The patient has 2 brothers, one half-sister. There is no [name] history in the family to her knowledge  GYNECOLOGIC HISTORY:  No LMP recorded. Patient is postmenopausal. Menarche age 83, first live birth age 75, the patient is [place] P2. She went through the change of life around age 30. She did not take hormone replacement.  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 She is widowed. At home she lives with her sister [name] [name] . The patient's children are [name] [name] who works as a Runner, broadcasting/film/video in [place], and [name] [name],, works at urgent care here in town. The patient has 5 grandchildren. She is a Psychologist, forensic    ADVANCED DIRECTIVES: Not in place   HEALTH MAINTENANCE: Social History   Tobacco Use  . Smoking status: Former Smoker    Quit date: 12/10/1990    Years since quitting: 28.5  . Smokeless tobacco: Never Used  Substance Use Topics  . Alcohol use: No  . Drug use: No     Colonoscopy: 2016?  PAP: Per Dr. Ruthann [name]  Bone density:  Lipid panel:  Allergies  Allergen Reactions  . Peanut-Containing Drug Products Swelling  . Codeine Nausea And Vomiting    Current Outpatient Medications  Medication Sig Dispense Refill  . naproxen (NAPROSYN) 375 MG tablet Take 1 tablet (375 mg total) by mouth 2 (two) times daily. 20 tablet 0  . tamoxifen (NOLVADEX) 20 MG tablet Take 1 tablet (20 mg total) by mouth daily. Pt is out of town and forgot her pills. (Patient taking differently: Take 20 mg by mouth daily. ) 3 tablet 0  . tamoxifen (NOLVADEX) 20 MG tablet TAKE 1 TABLET BY MOUTH EVERY DAY 90 tablet 4   No current facility-administered medications for this visit.     OBJECTIVE: Middle-aged [place] American woman who appears well  Vitals:   06/17/19 1507  BP: 134/74  Pulse: 72  Resp: 18  Temp: 98.2 F (36.8 C)  SpO2: 100%   Wt Readings from Last 3  Encounters:  06/17/19 170 lb 1.6 oz (77.2 kg)  10/19/18 171 lb (77.6 kg)  06/14/18 175 lb 8 oz (79.6 kg)   Body mass index is 30.86 kg/m.    ECOG FS:1 - Symptomatic but completely ambulatory  Ocular: Sclerae unicteric, pupils round and equal Ear-nose-throat: Wearing a mask Lymphatic: No cervical or supraclavicular adenopathy Lungs no rales or rhonchi Heart regular rate and rhythm Abd soft, nontender, positive bowel sounds MSK no focal spinal tenderness, no joint edema Neuro: non-focal, well-oriented, appropriate affect Breasts: The right breast is status post lumpectomy and radiation.  There is no evidence of disease recurrence.  The left breast is benign.  Both axillae are benign.    LAB RESULTS:  CMP     Component Value Date/Time   NA 142 06/17/2019 1445   NA 143 06/14/2017 1129   K 4.3 06/17/2019 1445   K 4.0 06/14/2017 1129   CL 108 06/17/2019 1445  CO2 24 06/17/2019 1445   CO2 26 06/14/2017 1129   GLUCOSE 108 (H) 06/17/2019 1445   GLUCOSE 93 06/14/2017 1129   BUN 15 06/17/2019 1445   BUN 17.2 06/14/2017 1129   CREATININE 1.14 (H) 06/17/2019 1445   CREATININE 0.99 06/14/2018 1305   CREATININE 0.9 06/14/2017 1129   CALCIUM 9.5 06/17/2019 1445   CALCIUM 9.6 06/14/2017 1129   PROT 7.1 06/17/2019 1445   PROT 6.4 06/14/2017 1129   ALBUMIN 4.0 06/17/2019 1445   ALBUMIN 3.5 06/14/2017 1129   AST 17 06/17/2019 1445   AST 18 06/14/2018 1305   AST 18 06/14/2017 1129   ALT 13 06/17/2019 1445   ALT 11 06/14/2018 1305   ALT 15 06/14/2017 1129   ALKPHOS 62 06/17/2019 1445   ALKPHOS 62 06/14/2017 1129   BILITOT 0.3 06/17/2019 1445   BILITOT 0.3 06/14/2018 1305   BILITOT 0.37 06/14/2017 1129   GFRNONAA 49 (L) 06/17/2019 1445   GFRNONAA 57 (L) 06/14/2018 1305   GFRAA 57 (L) 06/17/2019 1445   GFRAA >60 06/14/2018 1305    No results found for: SPEP  Lab Results  Component Value Date   WBC 5.8 06/17/2019   NEUTROABS 3.0 06/17/2019   HGB 12.8 06/17/2019   HCT  39.7 06/17/2019   MCV 82.7 06/17/2019   PLT 174 06/17/2019      Chemistry      Component Value Date/Time   NA 142 06/17/2019 1445   NA 143 06/14/2017 1129   K 4.3 06/17/2019 1445   K 4.0 06/14/2017 1129   CL 108 06/17/2019 1445   CO2 24 06/17/2019 1445   CO2 26 06/14/2017 1129   BUN 15 06/17/2019 1445   BUN 17.2 06/14/2017 1129   CREATININE 1.14 (H) 06/17/2019 1445   CREATININE 0.99 06/14/2018 1305   CREATININE 0.9 06/14/2017 1129      Component Value Date/Time   CALCIUM 9.5 06/17/2019 1445   CALCIUM 9.6 06/14/2017 1129   ALKPHOS 62 06/17/2019 1445   ALKPHOS 62 06/14/2017 1129   AST 17 06/17/2019 1445   AST 18 06/14/2018 1305   AST 18 06/14/2017 1129   ALT 13 06/17/2019 1445   ALT 11 06/14/2018 1305   ALT 15 06/14/2017 1129   BILITOT 0.3 06/17/2019 1445   BILITOT 0.3 06/14/2018 1305   BILITOT 0.37 06/14/2017 1129       No results found for: LABCA2  No components found for: [id number]  No results for input(s): INR in the last 168 hours.  Urinalysis    Component Value Date/Time   COLORURINE YELLOW 12/11/2013 1331   APPEARANCEUR CLEAR 12/11/2013 1331   LABSPEC 1.029 12/11/2013 1331   PHURINE 6.5 12/11/2013 1331   GLUCOSEU NEGATIVE 12/11/2013 1331   HGBUR NEGATIVE 12/11/2013 1331   BILIRUBINUR NEGATIVE 12/11/2013 1331   KETONESUR NEGATIVE 12/11/2013 1331   PROTEINUR NEGATIVE 12/11/2013 1331   UROBILINOGEN 1.0 12/11/2013 1331   NITRITE NEGATIVE 12/11/2013 1331   LEUKOCYTESUR TRACE (A) 12/11/2013 1331    STUDIES: Mm Diag Breast Tomo Bilateral  Result Date: 06/11/2019 CLINICAL DATA:  68 year old female for annual follow-up. History of RIGHT breast [name] and lumpectomy in 2015. EXAM: DIGITAL DIAGNOSTIC BILATERAL MAMMOGRAM WITH CAD AND TOMO COMPARISON:  Previous exam(s). ACR Breast Density Category b: There are scattered areas of fibroglandular density. FINDINGS: 2D and 3D full field views of both breasts and a magnification view of the lumpectomy site  demonstrate no suspicious mass, nonsurgical distortion or worrisome calcifications. RIGHT lumpectomy changes again noted. Mammographic  images were processed with CAD. IMPRESSION: No mammographic evidence of breast malignancy RECOMMENDATION: Bilateral screening mammogram in 1 year. I have discussed the findings and recommendations with the patient. If applicable, a reminder letter will be sent to the patient regarding the next appointment. BI-RADS CATEGORY  2: Benign. Electronically Signed   By: Margarette Canada M.D.   On: 06/11/2019 10:43    Bilateral diagnostic mammography with CAD and tomography on 03/19/2018 at [facility] showed: breast density category B. There was no evidence of malignancy.    ASSESSMENT: 68 y.o. [place] woman Status post right upper outer quadrant breast biopsy to 12/06/2013 for an atypical papillary lesion.  (1) right lumpectomy March 2015 showed an intracystic papillary carcinoma measuring 5.2 cm, grade 1, 100% estrogen and 100% progesterone receptor positive, with an MIB-1 of 8% and no HER-2 amplification.  (2) status post right axillary lymph node sampling 01/29/2014, both sentinel lymph nodes being clear.  (3) Oncotype score of 0 predicts a 10 years risk of outside the breast recurrence of 3% if the patient's only systemic therapy is tamoxifen for 5 years  (4) adjuvant radiation 03/17/2014-04/17/2014: Right breast/ 50 Gy in 25 fractions at 2 Gy per fraction (last 2 treatments given BID)   (5) started tamoxifen 05/17/2014.   PLAN: Maryuri is now more than 5 years out from definitive surgery for her breast [name] with no evidence of disease recurrence.  This is very favorable.  She completes 5 years of tamoxifen now.  She just got a 48-monthsupply so she will take that and then stop.  She is ready to "graduate".  We discussed the survivorship program and she is very interested in participating.  She will accordingly return to see my p31assistant a year  from now.  Of course she will continue to have yearly mammography as well  I am concerned about the pain she is having in the right chest wall area.  We are going to obtain a chest CT scan to make sure there is no finding of concern  I also suggested she increase her fluid intake to see if we can arrest any further renal deterioration.  She knows to call for any other issue that may develop before the next visit.   , GVirgie Dad MD  06/17/19 3:44 PM Medical Oncology and Hematology [name] [id number]. [phone]   Fax. [phone] I, AJacqualyn Poseyam acting as a sEducation administratorfor GChauncey Cruel MD.   I, GLurline DelMD, have reviewed the above documentation for accuracy and completeness, and I agree with the above.

## 2019-06-17 ENCOUNTER — Inpatient Hospital Stay: Payer: Medicare Other | Attending: Oncology

## 2019-06-17 ENCOUNTER — Other Ambulatory Visit: Payer: Self-pay

## 2019-06-17 ENCOUNTER — Inpatient Hospital Stay (HOSPITAL_BASED_OUTPATIENT_CLINIC_OR_DEPARTMENT_OTHER): Payer: Medicare Other | Admitting: Oncology

## 2019-06-17 VITALS — BP 134/74 | HR 72 | Temp 98.2°F | Resp 18 | Wt 170.1 lb

## 2019-06-17 DIAGNOSIS — C50411 Malignant neoplasm of upper-outer quadrant of right female breast: Secondary | ICD-10-CM | POA: Insufficient documentation

## 2019-06-17 DIAGNOSIS — Z170 Estrogen receptor positive status [ER+]: Secondary | ICD-10-CM | POA: Insufficient documentation

## 2019-06-17 DIAGNOSIS — R0789 Other chest pain: Secondary | ICD-10-CM | POA: Diagnosis not present

## 2019-06-17 DIAGNOSIS — Z79810 Long term (current) use of selective estrogen receptor modulators (SERMs): Secondary | ICD-10-CM | POA: Diagnosis not present

## 2019-06-17 DIAGNOSIS — Z923 Personal history of irradiation: Secondary | ICD-10-CM | POA: Insufficient documentation

## 2019-06-17 DIAGNOSIS — C50311 Malignant neoplasm of lower-inner quadrant of right female breast: Secondary | ICD-10-CM

## 2019-06-17 LAB — COMPREHENSIVE METABOLIC PANEL
ALT: 13 U/L (ref 0–44)
AST: 17 U/L (ref 15–41)
Albumin: 4 g/dL (ref 3.5–5.0)
Alkaline Phosphatase: 62 U/L (ref 38–126)
Anion gap: 10 (ref 5–15)
BUN: 15 mg/dL (ref 8–23)
CO2: 24 mmol/L (ref 22–32)
Calcium: 9.5 mg/dL (ref 8.9–10.3)
Chloride: 108 mmol/L (ref 98–111)
Creatinine, Ser: 1.14 mg/dL — ABNORMAL HIGH (ref 0.44–1.00)
GFR calc Af Amer: 57 mL/min — ABNORMAL LOW (ref >60)
GFR calc non Af Amer: 49 mL/min — ABNORMAL LOW (ref >60)
Glucose, Bld: 108 mg/dL — ABNORMAL HIGH (ref 70–99)
Potassium: 4.3 mmol/L (ref 3.5–5.1)
Sodium: 142 mmol/L (ref 135–145)
Total Bilirubin: 0.3 mg/dL (ref 0.3–1.2)
Total Protein: 7.1 g/dL (ref 6.5–8.1)

## 2019-06-17 LAB — CBC WITH DIFFERENTIAL/PLATELET
Abs Immature Granulocytes: 0.04 10*3/uL (ref 0.00–0.07)
Basophils Absolute: 0 10*3/uL (ref 0.0–0.1)
Basophils Relative: 1 %
Eosinophils Absolute: 0.1 10*3/uL (ref 0.0–0.5)
Eosinophils Relative: 2 %
HCT: 39.7 % (ref 36.0–46.0)
Hemoglobin: 12.8 g/dL (ref 12.0–15.0)
Immature Granulocytes: 1 %
Lymphocytes Relative: 37 %
Lymphs Abs: 2.1 10*3/uL (ref 0.7–4.0)
MCH: 26.7 pg (ref 26.0–34.0)
MCHC: 32.2 g/dL (ref 30.0–36.0)
MCV: 82.7 fL (ref 80.0–100.0)
Monocytes Absolute: 0.5 10*3/uL (ref 0.1–1.0)
Monocytes Relative: 8 %
Neutro Abs: 3 10*3/uL (ref 1.7–7.7)
Neutrophils Relative %: 51 %
Platelets: 174 10*3/uL (ref 150–400)
RBC: 4.8 MIL/uL (ref 3.87–5.11)
RDW: 13.1 % (ref 11.5–15.5)
WBC: 5.8 10*3/uL (ref 4.0–10.5)
nRBC: 0 % (ref 0.0–0.2)

## 2019-06-25 ENCOUNTER — Telehealth: Payer: Self-pay

## 2019-06-25 NOTE — Telephone Encounter (Signed)
Pt called and lvm stating her CT scan had been cancelled and she has new insurance.  Per note on cancelled appt pt's is out of network.   RN returned call and instructed pt to call her insurance company to find out where they will cover for her to have scan and then call us and let us know so we can order scan at appropriate facility. Pt verbalizes understanding

## 2019-06-25 NOTE — Telephone Encounter (Signed)
Pt called to inform that CT scan cannot be done through [facility] because we are out of network with her new insurance coverage.  She states that scan will need to be done at [place] to schedule scan and fax order.  Per [facility] pt has never been seen there.  RN lvm with their new pt coordinator explaining situation and request a return call.   Pt has been updated. RN also instructed pt to find out from her insurance company what oncology practice is considered in network so that we can make that referral.

## 2019-06-26 ENCOUNTER — Ambulatory Visit (HOSPITAL_COMMUNITY): Admission: RE | Admit: 2019-06-26 | Payer: Medicare Other | Source: Ambulatory Visit

## 2019-06-27 ENCOUNTER — Encounter: Payer: Self-pay | Admitting: Oncology

## 2019-07-02 ENCOUNTER — Telehealth: Payer: Self-pay | Admitting: *Deleted

## 2019-07-02 NOTE — Telephone Encounter (Signed)
This RN contacted pt to inquire about current medical insurance- due to need to obtain CT - pt has card for medicare , but on print out she has [place].  [name] [name] states she has new insurance with her medicare- and she is awaiting new card to be sent - for [facility].  [name] [name] states she will bring card to this office for our records as soon as she gets int ( which should be any day ).  This RN asked her to call this RN to inform her when she has brought card in so we can proceed with scheduling CT.  [name] [name] stated understanding and agreement.

## 2020-02-06 ENCOUNTER — Ambulatory Visit: Payer: Medicare Other | Attending: Internal Medicine

## 2020-02-06 DIAGNOSIS — Z23 Encounter for immunization: Secondary | ICD-10-CM

## 2020-02-06 NOTE — Progress Notes (Signed)
   Covid-19 Vaccination Clinic  Name:  [name] [name]    MRN: [id number] DOB: 1950-11-30  02/06/2020  [name] was observed post Covid-19 immunization for 15 minutes without incident. She was provided with Vaccine Information Sheet and instruction to access the V-Safe system.   [name] was instructed to call 911 with any severe reactions post vaccine: Marland Kitchen Difficulty breathing  . Swelling of face and throat  . A fast heartbeat  . A bad rash all over body  . Dizziness and weakness   Immunizations Administered    Name Date Dose VIS Date Route   Pfizer COVID-19 Vaccine 02/06/2020  8:32 AM 0.3 mL 12/11/2018 Intramuscular   Manufacturer: [place]   Lot: [id number]   [place]: [id number]

## 2020-03-02 ENCOUNTER — Ambulatory Visit: Payer: Medicare Other | Attending: Internal Medicine

## 2020-03-02 DIAGNOSIS — Z23 Encounter for immunization: Secondary | ICD-10-CM

## 2020-03-02 NOTE — Progress Notes (Signed)
   Covid-19 Vaccination Clinic  Name:  [name] [name]    MRN: [id number] DOB: 1951/05/26  03/02/2020  [name] was observed post Covid-19 immunization for 15 minutes without incident. She was provided with Vaccine Information Sheet and instruction to access the V-Safe system.   [name] was instructed to call 911 with any severe reactions post vaccine: Marland Kitchen Difficulty breathing  . Swelling of face and throat  . A fast heartbeat  . A bad rash all over body  . Dizziness and weakness   Immunizations Administered    Name Date Dose VIS Date Route   Pfizer COVID-19 Vaccine 03/02/2020  8:37 AM 0.3 mL 12/11/2018 Intramuscular   Manufacturer: [place]   Lot: [id number]   [place]: [id number]

## 2020-04-30 ENCOUNTER — Other Ambulatory Visit: Payer: Self-pay | Admitting: Internal Medicine

## 2020-04-30 DIAGNOSIS — Z1231 Encounter for screening mammogram for malignant neoplasm of breast: Secondary | ICD-10-CM

## 2020-06-11 ENCOUNTER — Other Ambulatory Visit: Payer: Self-pay

## 2020-06-11 ENCOUNTER — Ambulatory Visit
Admission: RE | Admit: 2020-06-11 | Discharge: 2020-06-11 | Disposition: A | Discharge status: Home / Self Care | Payer: Medicare Other | Source: Ambulatory Visit | Attending: Internal Medicine | Admitting: Internal Medicine

## 2020-06-11 DIAGNOSIS — Z1231 Encounter for screening mammogram for malignant neoplasm of breast: Secondary | ICD-10-CM

## 2020-06-18 ENCOUNTER — Other Ambulatory Visit: Payer: 59

## 2020-06-18 ENCOUNTER — Encounter: Payer: 59 | Admitting: Adult Health

## 2020-07-22 ENCOUNTER — Inpatient Hospital Stay: Payer: Medicare Other | Attending: Adult Health

## 2020-07-22 ENCOUNTER — Encounter: Payer: Self-pay | Admitting: Adult Health

## 2020-07-22 ENCOUNTER — Other Ambulatory Visit: Payer: Self-pay

## 2020-07-22 ENCOUNTER — Inpatient Hospital Stay: Payer: Medicare Other | Admitting: Adult Health

## 2020-07-22 VITALS — BP 135/65 | HR 75 | Temp 97.8°F | Resp 18 | Ht 62.25 in | Wt 173.4 lb

## 2020-07-22 DIAGNOSIS — Z923 Personal history of irradiation: Secondary | ICD-10-CM | POA: Diagnosis not present

## 2020-07-22 DIAGNOSIS — Z170 Estrogen receptor positive status [ER+]: Secondary | ICD-10-CM | POA: Insufficient documentation

## 2020-07-22 DIAGNOSIS — Z853 Personal history of malignant neoplasm of breast: Secondary | ICD-10-CM | POA: Insufficient documentation

## 2020-07-22 DIAGNOSIS — C50311 Malignant neoplasm of lower-inner quadrant of right female breast: Secondary | ICD-10-CM | POA: Diagnosis not present

## 2020-07-22 DIAGNOSIS — Z8249 Family history of ischemic heart disease and other diseases of the circulatory system: Secondary | ICD-10-CM | POA: Insufficient documentation

## 2020-07-22 DIAGNOSIS — Z87891 Personal history of nicotine dependence: Secondary | ICD-10-CM | POA: Insufficient documentation

## 2020-07-22 LAB — CBC WITH DIFFERENTIAL/PLATELET
Abs Immature Granulocytes: 0.04 10*3/uL (ref 0.00–0.07)
Basophils Absolute: 0 10*3/uL (ref 0.0–0.1)
Basophils Relative: 1 %
Eosinophils Absolute: 0.1 10*3/uL (ref 0.0–0.5)
Eosinophils Relative: 2 %
HCT: 38.9 % (ref 36.0–46.0)
Hemoglobin: 12.6 g/dL (ref 12.0–15.0)
Immature Granulocytes: 1 %
Lymphocytes Relative: 36 %
Lymphs Abs: 2 10*3/uL (ref 0.7–4.0)
MCH: 26.9 pg (ref 26.0–34.0)
MCHC: 32.4 g/dL (ref 30.0–36.0)
MCV: 83.1 fL (ref 80.0–100.0)
Monocytes Absolute: 0.5 10*3/uL (ref 0.1–1.0)
Monocytes Relative: 9 %
Neutro Abs: 2.8 10*3/uL (ref 1.7–7.7)
Neutrophils Relative %: 51 %
Platelets: 152 10*3/uL (ref 150–400)
RBC: 4.68 MIL/uL (ref 3.87–5.11)
RDW: 13.6 % (ref 11.5–15.5)
WBC: 5.4 10*3/uL (ref 4.0–10.5)
nRBC: 0 % (ref 0.0–0.2)

## 2020-07-22 LAB — COMPREHENSIVE METABOLIC PANEL
ALT: 18 U/L (ref 0–44)
AST: 21 U/L (ref 15–41)
Albumin: 3.7 g/dL (ref 3.5–5.0)
Alkaline Phosphatase: 83 U/L (ref 38–126)
Anion gap: 6 (ref 5–15)
BUN: 16 mg/dL (ref 8–23)
CO2: 27 mmol/L (ref 22–32)
Calcium: 9.8 mg/dL (ref 8.9–10.3)
Chloride: 107 mmol/L (ref 98–111)
Creatinine, Ser: 1.12 mg/dL — ABNORMAL HIGH (ref 0.44–1.00)
GFR calc non Af Amer: 50 mL/min — ABNORMAL LOW (ref >60)
Glucose, Bld: 89 mg/dL (ref 70–99)
Potassium: 4.2 mmol/L (ref 3.5–5.1)
Sodium: 140 mmol/L (ref 135–145)
Total Bilirubin: 0.5 mg/dL (ref 0.3–1.2)
Total Protein: 7 g/dL (ref 6.5–8.1)

## 2020-07-22 NOTE — Progress Notes (Signed)
CLINIC:  Survivorship   REASON FOR VISIT:  Routine follow-up for history of breast cancer.   BRIEF ONCOLOGIC HISTORY:  Per Dr. Virgie Dad most recent progress note:  [place] woman Status post right upper outer quadrant breast biopsy to 12/06/2013 for an atypical papillary lesion.  (1) right lumpectomy March 2015 showed an intracystic papillary carcinoma measuring 5.2 cm, grade 1, 100% estrogen and 100% progesterone receptor positive, with an MIB-1 of 8% and no HER-2 amplification.  (2) status post right axillary lymph node sampling 01/29/2014, both sentinel lymph nodes being clear.  (3) Oncotype score of 0 predicts a 10 years risk of outside the breast recurrence of 3% if the patient's only systemic therapy is tamoxifen for 5 years  (4) adjuvant radiation 03/17/2014-04/17/2014: Right breast/ 50 Gy in 25 fractions at 2 Gy per fraction (last 2 treatments given BID)   (5) tamoxifen 05/17/2014-08/2019  INTERVAL HISTORY:  Ms. Casad presents to the [facility] today for routine follow-up for her history of breast cancer.  Overall, she reports feeling quite well. She is looking for a new PCP and wonders if I have any recommendations.  She denies any new issues or concerns today.    Her most recent mammogram was completed on 06/12/2020 and showed no evidence of malignancy and breast density category A.   Agam is exercising regularly and eats plenty of fruits and vegetables.  She thinks her colonoscopy is due soon and plans to talk to Dr. Collene Mares about when that is.    REVIEW OF SYSTEMS:  Review of Systems  Constitutional: Negative for appetite change, chills, fatigue, fever and unexpected weight change.  HENT:   Negative for hearing loss, lump/mass and trouble swallowing.   Eyes: Negative for eye problems and icterus.  Respiratory: Negative for chest tightness, cough and shortness of breath.   Cardiovascular: Negative for chest pain, leg swelling and palpitations.    Gastrointestinal: Negative for abdominal distention, abdominal pain, blood in stool, constipation, diarrhea, rectal pain and vomiting.  Endocrine: Negative for hot flashes.  Genitourinary: Negative for difficulty urinating.   Musculoskeletal: Negative for arthralgias.  Skin: Negative for itching and rash.  Neurological: Negative for dizziness, extremity weakness, headaches and numbness.  Hematological: Negative for adenopathy. Does not bruise/bleed easily.  Psychiatric/Behavioral: Negative for depression. The patient is not nervous/anxious.    Breast: Denies any new nodularity, masses, tenderness, nipple changes, or nipple discharge.       PAST MEDICAL/SURGICAL HISTORY:  Past Medical History:  Diagnosis Date  . Arthritis    right knee  . Cancer (HCC)    breast  . Full dentures   . Personal history of radiation therapy   . Radiation    Left Breast  . Wears glasses    Past Surgical History:  Procedure Laterality Date  . APPENDECTOMY    . AXILLARY SENTINEL NODE BIOPSY Right 01/29/2014   Procedure: RIGHT AXILLARY SENTINEL LYMPHNODE BIOPSY;  Surgeon: Adin Hector, MD;  Location: [facility];  Service: General;  Laterality: Right;  . BREAST LUMPECTOMY Right 12/16/2013   Procedure: LUMPECTOMY;  Surgeon: Adin Hector, MD;  Location: [facility];  Service: General;  Laterality: Right;  . TUBAL LIGATION       ALLERGIES:  Allergies  Allergen Reactions  . Peanut-Containing Drug Products Swelling  . Codeine Nausea And Vomiting     CURRENT MEDICATIONS:  Outpatient Encounter Medications as of 07/22/2020  Medication Sig  . naproxen (NAPROSYN) 375 MG tablet Take 1 tablet (375  mg total) by mouth 2 (two) times daily.  . [DISCONTINUED] tamoxifen (NOLVADEX) 20 MG tablet Take 1 tablet (20 mg total) by mouth daily. Pt is out of town and forgot her pills. (Patient taking differently: Take 20 mg by mouth daily. )  . [DISCONTINUED] tamoxifen (NOLVADEX) 20  MG tablet TAKE 1 TABLET BY MOUTH EVERY DAY   No facility-administered encounter medications on file as of 07/22/2020.     ONCOLOGIC FAMILY HISTORY:  Family History  Problem Relation Age of Onset  . Hypertension Sister   . Hypertension Brother   . Breast cancer Neg Hx     GENETIC COUNSELING/TESTING: Not at this time  SOCIAL HISTORY:  Social History   Socioeconomic History  . Marital status: Widowed    Spouse name: Not on file  . Number of children: 2  . Years of education: Not on file  . Highest education level: Not on file  Occupational History  . Occupation: Airline pilot: [employer]  Tobacco Use  . Smoking status: Former Smoker    Quit date: 12/10/1990    Years since quitting: 29.6  . Smokeless tobacco: Never Used  Vaping Use  . Vaping Use: Never used  Substance and Sexual Activity  . Alcohol use: No  . Drug use: No  . Sexual activity: Not Currently  Other Topics Concern  . Not on file  Social History Narrative  . Not on file   Social Determinants of Health   Financial Resource Strain:   . Difficulty of Paying Living Expenses: Not on file  Food Insecurity:   . Worried About Charity fundraiser in the Last Year: Not on file  . Ran Out of Food in the Last Year: Not on file  Transportation Needs:   . Lack of Transportation (Medical): Not on file  . Lack of Transportation (Non-Medical): Not on file  Physical Activity:   . Days of Exercise per Week: Not on file  . Minutes of Exercise per Session: Not on file  Stress:   . Feeling of Stress : Not on file  Social Connections:   . Frequency of Communication with Friends and Family: Not on file  . Frequency of Social Gatherings with Friends and Family: Not on file  . Attends Religious Services: Not on file  . Active Member of Clubs or Organizations: Not on file  . Attends Archivist Meetings: Not on file  . Marital Status: Not on file  Intimate Partner Violence:   . Fear of Current or  Ex-Partner: Not on file  . Emotionally Abused: Not on file  . Physically Abused: Not on file  . Sexually Abused: Not on file     PHYSICAL EXAMINATION:  Vital Signs: Vitals:   07/22/20 1004  BP: 135/65  Pulse: 75  Resp: 18  Temp: 97.8 F (36.6 C)  SpO2: 100%   Filed Weights   07/22/20 1004  Weight: 173 lb 6.4 oz (78.7 kg)   General: Well-nourished, well-appearing female in no acute distress.  Unaccompanied today.   HEENT: Head is normocephalic.  Pupils equal and reactive to light. Conjunctivae clear without exudate.  Sclerae anicteric. Mask in place.  Lymph: No cervical, supraclavicular, or infraclavicular lymphadenopathy noted on palpation.  Cardiovascular: Regular rate and rhythm.Marland Kitchen Respiratory: Clear to auscultation bilaterally. Chest expansion symmetric; breathing non-labored.  Breast Exam:  -Left breast: No appreciable masses on palpation. No skin redness, thickening, or peau d'orange appearance; no nipple retraction or nipple discharge; mild distortion in  symmetry at previous lumpectomy site well healed scar without erythema or nodularity.  -Right breast: No appreciable masses on palpation. No skin redness, thickening, or peau d'orange appearance; no nipple retraction or nipple discharge. -Axilla: No axillary adenopathy bilaterally.  GI: Abdomen soft and round; non-tender, non-distended. Bowel sounds normoactive. No hepatosplenomegaly.   GU: Deferred.  Neuro: No focal deficits. Steady gait.  Psych: Mood and affect normal and appropriate for situation.  MSK: No focal spinal tenderness to palpation, full range of motion in bilateral upper extremities Extremities: No edema. Skin: Warm and dry.  LABORATORY DATA:  Appointment on 07/22/2020  Component Date Value Ref Range Status  . Sodium 07/22/2020 140  135 - 145 mmol/L Final  . Potassium 07/22/2020 4.2  3.5 - 5.1 mmol/L Final  . Chloride 07/22/2020 107  98 - 111 mmol/L Final  . CO2 07/22/2020 27  22 - 32 mmol/L Final  .  Glucose, Bld 07/22/2020 89  70 - 99 mg/dL Final   Glucose reference range applies only to samples taken after fasting for at least 8 hours.  . BUN 07/22/2020 16  8 - 23 mg/dL Final  . Creatinine, Ser 07/22/2020 1.12* 0.44 - 1.00 mg/dL Final  . Calcium 07/22/2020 9.8  8.9 - 10.3 mg/dL Final  . Total Protein 07/22/2020 7.0  6.5 - 8.1 g/dL Final  . Albumin 07/22/2020 3.7  3.5 - 5.0 g/dL Final  . AST 07/22/2020 21  15 - 41 U/L Final  . ALT 07/22/2020 18  0 - 44 U/L Final  . Alkaline Phosphatase 07/22/2020 83  38 - 126 U/L Final  . Total Bilirubin 07/22/2020 0.5  0.3 - 1.2 mg/dL Final  . GFR calc non Af Amer 07/22/2020 50* >60 mL/min Final  . Anion gap 07/22/2020 6  5 - 15 Final   Performed at [facility], [place] [address] [place], [place] [address]  . WBC 07/22/2020 5.4  4.0 - 10.5 K/uL Final  . RBC 07/22/2020 4.68  3.87 - 5.11 MIL/uL Final  . Hemoglobin 07/22/2020 12.6  12.0 - 15.0 g/dL Final  . HCT 07/22/2020 38.9  36 - 46 % Final  . MCV 07/22/2020 83.1  80.0 - 100.0 fL Final  . MCH 07/22/2020 26.9  26.0 - 34.0 pg Final  . MCHC 07/22/2020 32.4  30.0 - 36.0 g/dL Final  . RDW 07/22/2020 13.6  11.5 - 15.5 % Final  . Platelets 07/22/2020 152  150 - 400 K/uL Final  . nRBC 07/22/2020 0.0  0.0 - 0.2 % Final  . Neutrophils Relative % 07/22/2020 51  % Final  . Neutro Abs 07/22/2020 2.8  1.7 - 7.7 K/uL Final  . Lymphocytes Relative 07/22/2020 36  % Final  . Lymphs Abs 07/22/2020 2.0  0.7 - 4.0 K/uL Final  . Monocytes Relative 07/22/2020 9  % Final  . Monocytes Absolute 07/22/2020 0.5  0 - 1 K/uL Final  . Eosinophils Relative 07/22/2020 2  % Final  . Eosinophils Absolute 07/22/2020 0.1  0 - 0 K/uL Final  . Basophils Relative 07/22/2020 1  % Final  . Basophils Absolute 07/22/2020 0.0  0 - 0 K/uL Final  . Immature Granulocytes 07/22/2020 1  % Final  . Abs Immature Granulocytes 07/22/2020 0.04  0.00 - 0.07 K/uL Final   Performed at [facility],  [place] [address] [place], [place] [address]  ]   DIAGNOSTIC IMAGING:  Most recent mammogram:  CLINICAL DATA:  Screening.  EXAM: DIGITAL SCREENING BILATERAL  MAMMOGRAM WITH TOMO AND CAD  COMPARISON:  Previous exam(s).  ACR Breast Density Category a: The breast tissue is almost entirely fatty.  FINDINGS: There are no findings suspicious for malignancy. Images were processed with CAD.  IMPRESSION: No mammographic evidence of malignancy. A result letter of this screening mammogram will be mailed directly to the patient.  RECOMMENDATION: Screening mammogram in one year. (Code:SM-B-01Y)  BI-RADS CATEGORY  1: Negative.   Electronically Signed   By: Dorise Bullion III M.D   On: 06/12/2020 14:20   ASSESSMENT AND PLAN:  Ms.. Lawther is a pleasant 69 y.o. female with history of Stage IIA right breast invasive papillary carcinoma, ER+/PR+/HER2-, diagnosed in 12/2013, treated with lumpectomy, adjuvant radiation therapy, and anti-estrogen therapy with Tamoxifen x 5 years completing therapy in 08/2019.  She [facility] for surveillance and routine follow-up.   1. History of breast cancer:  Ms. Vega is currently clinically and radiographically without evidence of disease or recurrence of breast cancer. She will be due for mammogram in 05/2021; orders placed today.  I reviewed the role of long term survivorship clinic and she would like to return annually. I encouraged her to call me with any questions or concerns before her next visit at the cancer center, and I would be happy to see her sooner, if needed.    2. Bone health:    She was given education on specific food and activities to promote bone health.  3. Cancer screening:  Due to Ms. Struckman's history and her age, she should receive screening for skin cancers, colon cancer. She was encouraged to follow-up with her PCP for appropriate cancer screenings.   4. Health maintenance and wellness promotion: Ms.  Kronberg was encouraged to consume 5-7 servings of fruits and vegetables per day. She was also encouraged to engage in moderate to vigorous exercise for 30 minutes per day most days of the week. She was instructed to limit her alcohol consumption and continue to abstain from tobacco use.    Dispo:  -Return to cancer center in one year for LTS f/u -Mammogram 05/2021  Total encounter time: 20 minutes*  Wilber Bihari, NP 07/22/20 10:24 AM Medical Oncology and Hematology [facility] [place], [place] [address] Tel. [phone]    Fax. [phone]  *Total Encounter Time as defined by the Centers for Medicare and Medicaid Services includes, in addition to the face-to-face time of a patient visit (documented in the note above) non-face-to-face time: obtaining and reviewing outside history, ordering and reviewing medications, tests or procedures, care coordination (communications with other health care professionals or caregivers) and documentation in the medical record.    Note: PRIMARY CARE PROVIDER Pcp, No None None

## 2020-07-23 ENCOUNTER — Telehealth: Payer: Self-pay | Admitting: Adult Health

## 2020-07-23 NOTE — Telephone Encounter (Signed)
Per 10/6 LOS scheduled patient and called with appointment time- patient is aware

## 2021-01-27 ENCOUNTER — Other Ambulatory Visit: Payer: Self-pay

## 2021-01-27 ENCOUNTER — Ambulatory Visit
Admission: EM | Admit: 2021-01-27 | Discharge: 2021-01-27 | Disposition: A | Discharge status: Home / Self Care | Payer: Medicare Other | Attending: Family Medicine | Admitting: Family Medicine

## 2021-01-27 DIAGNOSIS — L258 Unspecified contact dermatitis due to other agents: Secondary | ICD-10-CM

## 2021-01-27 MED ORDER — DEXAMETHASONE SODIUM PHOSPHATE 10 MG/ML IJ SOLN
10.0000 mg | Freq: Once | INTRAMUSCULAR | Status: AC
  Administered 2021-01-27: 10 mg via INTRAMUSCULAR

## 2021-01-27 MED ORDER — TRIAMCINOLONE ACETONIDE 0.025 % EX OINT
1.0000 | TOPICAL_OINTMENT | Freq: Two times a day (BID) | CUTANEOUS | 0 refills | Status: DC
Start: 2021-01-27 — End: 2021-07-22

## 2021-01-27 NOTE — ED Triage Notes (Signed)
Pt c/o poison ivy to bilateral forearms and bilateral lower legs since week. States using benadryl cream and calamine lotion with little relief for itching.

## 2021-01-27 NOTE — ED Provider Notes (Signed)
[name]    CSN: [id number] Arrival date & time: 01/27/21  1838      History   Chief Complaint No chief complaint on file.   HPI [name] [name] is a 70 y.o. female.   HPI Patient presents with rash eruption which started x 3 days ago and progressively worsen. Rash locations:Right leg anterior rash, left calf rash, and bilateral arms. She was outside performing yard work and noticed rash following this activity. Unknown if contact with any specific irritant however associates rash with out door activities.  She has attempted relief with Benadryl cream and calamine lotion for management of itching however this is not resolved or improved worsening of rash.  She denies any fever, nausea or vomiting.  Denies any difficulty swelling or lesions surrounding her mouth. Past Medical History:  Diagnosis Date  . Arthritis    right knee  . Cancer (HCC)    breast  . Full dentures   . Personal history of radiation therapy   . Radiation    Left Breast  . Wears glasses     Patient Active Problem List   Diagnosis Date Noted  . Malignant neoplasm of lower-inner quadrant of right breast of female, estrogen receptor positive ([name]) 12/31/2013    Past Surgical History:  Procedure Laterality Date  . APPENDECTOMY    . AXILLARY SENTINEL NODE BIOPSY Right 01/29/2014   Procedure: RIGHT AXILLARY SENTINEL LYMPHNODE BIOPSY;  Surgeon: Adin Hector, MD;  Location: [facility];  Service: General;  Laterality: Right;  . BREAST LUMPECTOMY Right 12/16/2013   Procedure: LUMPECTOMY;  Surgeon: Adin Hector, MD;  Location: [facility];  Service: General;  Laterality: Right;  . TUBAL LIGATION      OB History   No obstetric history on file.      Home Medications    Prior to Admission medications   Not on File    Family History Family History  Problem Relation Age of Onset  . Hypertension Sister   . Hypertension Brother   . Breast cancer  Neg Hx     Social History Social History   Tobacco Use  . Smoking status: Former Smoker    Quit date: 12/10/1990    Years since quitting: 30.1  . Smokeless tobacco: Never Used  Vaping Use  . Vaping Use: Never used  Substance Use Topics  . Alcohol use: No  . Drug use: No     Allergies   Peanut-containing drug products and Codeine   Review of Systems Review of Systems Pertinent negatives listed in HPI  Physical Exam Triage Vital Signs ED Triage Vitals  Enc Vitals Group     BP 01/27/21 1929 133/74     Pulse Rate 01/27/21 1929 74     Resp 01/27/21 1929 20     Temp 01/27/21 1929 98.6 F (37 C)     Temp Source 01/27/21 1929 Oral     SpO2 01/27/21 1929 98 %     Weight --      Height --      Head Circumference --      Peak Flow --      Pain Score 01/27/21 1932 0     Pain Loc --      Pain Edu? --      Excl. in Agency? --    No data found.  Updated Vital Signs BP 133/74 (BP Location: Left Arm)   Pulse 74   Temp 98.6 F (37  C) (Oral)   Resp 20   SpO2 98%   Visual Acuity Right Eye Distance:   Left Eye Distance:   Bilateral Distance:    Right Eye Near:   Left Eye Near:    Bilateral Near:     Physical Exam General appearance: alert, well developed, well nourished, cooperative  Head: Normocephalic, without obvious abnormality, atraumatic Respiratory: Respirations even and unlabored, normal respiratory rate Heart: Rate and rhythm normal. No gallop or murmurs noted on exam  Extremities: No gross deformities Skin: Skin color, texture, turgor normal. +maculopapular rash present  Psych: Appropriate mood and affect. Neurologic: GCS 15,normal gait, normal coordination  UC Treatments / Results  Labs (all labs ordered are listed, but only abnormal results are displayed) Labs Reviewed - No data to display  EKG   Radiology No results found.  Procedures Procedures (including critical care time)  Medications Ordered in UC Medications  dexamethasone (DECADRON)  injection 10 mg (10 mg Intramuscular Given 01/27/21 1957)    Initial Impression / Assessment and Plan / UC Course  I have reviewed the triage vital signs and the nursing notes.  Pertinent labs & imaging results that were available during my care of the patient were reviewed by me and considered in my medical decision making (see chart for details).    Contact dermatitis, Decadron 10 mg IM given here in clinic Acute management of itching with triamcinolone  BID x 7-10 days. Return precautions if symptoms worsen or do not improve Final Clinical Impressions(s) / UC Diagnoses   Final diagnoses:  Contact dermatitis due to other agent, unspecified contact dermatitis type     Discharge Instructions     Apply triamcinolone cream to the affected areas twice daily for the next 7 to 10 days. You received a steroid shot this will resolve any immediate reaction to the poison ivy.  The triamcinolone cream is a steroid cream and it will help resolve any localized itching or skin irritation.    ED Prescriptions    Medication Sig Dispense Auth. Provider   triamcinolone (KENALOG) 0.025 % ointment Apply 1 application topically 2 (two) times daily. 160 g [name], FNP     PDMP not reviewed this encounter.   [name], FNP 01/31/21 [phone]

## 2021-01-27 NOTE — Discharge Instructions (Addendum)
Apply triamcinolone cream to the affected areas twice daily for the next 7 to 10 days. You received a steroid shot this will resolve any immediate reaction to the poison ivy.  The triamcinolone cream is a steroid cream and it will help resolve any localized itching or skin irritation.

## 2021-03-02 DIAGNOSIS — D485 Neoplasm of uncertain behavior of skin: Secondary | ICD-10-CM | POA: Diagnosis not present

## 2021-03-02 DIAGNOSIS — Z01818 Encounter for other preprocedural examination: Secondary | ICD-10-CM | POA: Diagnosis not present

## 2021-03-02 DIAGNOSIS — H25811 Combined forms of age-related cataract, right eye: Secondary | ICD-10-CM | POA: Diagnosis not present

## 2021-03-02 DIAGNOSIS — H25812 Combined forms of age-related cataract, left eye: Secondary | ICD-10-CM | POA: Diagnosis not present

## 2021-03-11 DIAGNOSIS — H25811 Combined forms of age-related cataract, right eye: Secondary | ICD-10-CM | POA: Diagnosis not present

## 2021-03-11 DIAGNOSIS — H2511 Age-related nuclear cataract, right eye: Secondary | ICD-10-CM | POA: Diagnosis not present

## 2021-04-08 DIAGNOSIS — H25812 Combined forms of age-related cataract, left eye: Secondary | ICD-10-CM | POA: Diagnosis not present

## 2021-04-08 DIAGNOSIS — H2512 Age-related nuclear cataract, left eye: Secondary | ICD-10-CM | POA: Diagnosis not present

## 2021-05-03 ENCOUNTER — Other Ambulatory Visit: Payer: Self-pay | Admitting: Internal Medicine

## 2021-05-03 DIAGNOSIS — Z1231 Encounter for screening mammogram for malignant neoplasm of breast: Secondary | ICD-10-CM

## 2021-05-17 ENCOUNTER — Other Ambulatory Visit: Payer: Self-pay

## 2021-05-17 ENCOUNTER — Ambulatory Visit: Payer: Medicare HMO | Admitting: Podiatry

## 2021-05-17 DIAGNOSIS — B351 Tinea unguium: Secondary | ICD-10-CM | POA: Diagnosis not present

## 2021-05-17 MED ORDER — TERBINAFINE HCL 250 MG PO TABS
250.0000 mg | ORAL_TABLET | Freq: Every day | ORAL | 0 refills | Status: AC
Start: 2021-05-17 — End: 2021-08-15

## 2021-05-17 MED ORDER — CICLOPIROX 8 % EX SOLN: Freq: Every day | CUTANEOUS | 0 refills | Status: DC

## 2021-05-17 NOTE — Progress Notes (Signed)
  Subjective:  Patient ID: [name] [name], female    DOB: 1951/04/29,  MRN: [id number]  Chief Complaint  Patient presents with   Nail Problem     (np) fungal infection     70 y.o. female presents with the above complaint. History confirmed with patient.  Her sister is a patient of mine.  The nails have been thickened with brown discoloration for some time.  She is tried over-the-counter treatments but has not had much improvement.  Objective:  Physical Exam: warm, good capillary refill, no trophic changes or ulcerative lesions, normal DP and PT pulses, and normal sensory exam.  Left hallux third fourth and fifth toes and right hallux and fifth toes are thickened elongated nail plates with subungual debris and brown-yellow discoloration     Assessment:   1. Onychomycosis      Plan:  Patient was evaluated and treated and all questions answered.  Discussed topical and oral treatment options for onychomycosis as well as the etiology of it.  Recommended oral and topical simultaneous treatment.  Prescription for terbinafine and Penlac sent to her pharmacy.  Reviewed use and possible side effects.  Return to see me in 4 months for follow-up.  Photographs were taken.  Return in about 4 months (around 09/16/2021) for follow up after nail fungus treatment.

## 2021-06-25 ENCOUNTER — Other Ambulatory Visit: Payer: Self-pay

## 2021-06-25 ENCOUNTER — Ambulatory Visit
Admission: RE | Admit: 2021-06-25 | Discharge: 2021-06-25 | Disposition: A | Discharge status: Home / Self Care | Payer: Medicare HMO | Source: Ambulatory Visit | Attending: Internal Medicine | Admitting: Internal Medicine

## 2021-06-25 DIAGNOSIS — Z1231 Encounter for screening mammogram for malignant neoplasm of breast: Secondary | ICD-10-CM

## 2021-07-22 ENCOUNTER — Encounter: Payer: Self-pay | Admitting: Adult Health

## 2021-07-22 ENCOUNTER — Other Ambulatory Visit: Payer: Self-pay

## 2021-07-22 ENCOUNTER — Inpatient Hospital Stay: Payer: Medicare HMO | Attending: Adult Health | Admitting: Adult Health

## 2021-07-22 VITALS — BP 128/69 | HR 82 | Temp 97.8°F | Resp 18 | Ht 62.25 in | Wt 175.0 lb

## 2021-07-22 DIAGNOSIS — Z170 Estrogen receptor positive status [ER+]: Secondary | ICD-10-CM | POA: Diagnosis not present

## 2021-07-22 DIAGNOSIS — E2839 Other primary ovarian failure: Secondary | ICD-10-CM

## 2021-07-22 DIAGNOSIS — Z9223 Personal history of estrogen therapy: Secondary | ICD-10-CM | POA: Diagnosis not present

## 2021-07-22 DIAGNOSIS — Z923 Personal history of irradiation: Secondary | ICD-10-CM | POA: Diagnosis not present

## 2021-07-22 DIAGNOSIS — C50311 Malignant neoplasm of lower-inner quadrant of right female breast: Secondary | ICD-10-CM | POA: Diagnosis not present

## 2021-07-22 DIAGNOSIS — Z853 Personal history of malignant neoplasm of breast: Secondary | ICD-10-CM | POA: Diagnosis not present

## 2021-07-22 NOTE — Progress Notes (Signed)
CLINIC:  Survivorship   REASON FOR VISIT:  Routine follow-up for history of breast cancer.   BRIEF ONCOLOGIC HISTORY:  Per Dr. Virgie Dad most recent progress note:  [name] [name] Status post right upper outer quadrant breast biopsy to 12/06/2013 for an atypical papillary lesion.   (1) right lumpectomy March 2015 showed an intracystic papillary carcinoma measuring 5.2 cm, grade 1, 100% estrogen and 100% progesterone receptor positive, with an MIB-1 of 8% and no HER-2 amplification.   (2) status post right axillary lymph node sampling 01/29/2014, both sentinel lymph nodes being clear.   (3) Oncotype score of 0 predicts a 10 years risk of outside the breast recurrence of 3% if the patient's only systemic therapy is tamoxifen for 5 years   (4) adjuvant radiation 03/17/2014-04/17/2014:   Right breast/ 50 Gy in 25 fractions at 2 Gy per fraction (last 2 treatments given BID)    (5) tamoxifen 05/17/2014-08/2019  INTERVAL HISTORY:  [name] [name] presents to the [facility] today for routine follow-up for her history of breast cancer.  Overall, she reports feeling quite well. She is looking for a new PCP and wonders if I have any recommendations.  She denies any new issues or concerns today.    Her most recent mammogram was completed on 06/25/2021 and showed no evidence of malignancy and breast density category b. She continues to see her PCP.  We updated her health maintenance.  She notes that she is due for repeat colonoscopy next year, and is unsure when her last tetanus was.  She also notes she has not undergone bone density testing.    I reviewed her care everywhere and it appears she has been seeing Dr. Georgiann [name] in radiology.    REVIEW OF SYSTEMS:  Review of Systems  Constitutional:  Negative for appetite change, chills, fatigue, fever and unexpected weight change.  HENT:   Negative for hearing loss, lump/mass and trouble swallowing.   Eyes:  Negative for eye problems and icterus.   Respiratory:  Negative for chest tightness, cough and shortness of breath.   Cardiovascular:  Negative for chest pain, leg swelling and palpitations.  Gastrointestinal:  Negative for abdominal distention, abdominal pain, blood in stool, constipation, diarrhea, rectal pain and vomiting.  Endocrine: Negative for hot flashes.  Genitourinary:  Negative for difficulty urinating.   Musculoskeletal:  Negative for arthralgias.  Skin:  Negative for itching and rash.  Neurological:  Negative for dizziness, extremity weakness, headaches and numbness.  Hematological:  Negative for adenopathy. Does not bruise/bleed easily.  Psychiatric/Behavioral:  Negative for depression. The patient is not nervous/anxious.   Breast: Denies any new nodularity, masses, tenderness, nipple changes, or nipple discharge.       PAST MEDICAL/SURGICAL HISTORY:  Past Medical History:  Diagnosis Date   Arthritis    right knee   Cancer ([place])    breast   Full dentures    Personal history of radiation therapy    Radiation    Left Breast   Wears glasses    Past Surgical History:  Procedure Laterality Date   APPENDECTOMY     AXILLARY SENTINEL NODE BIOPSY Right 01/29/2014   Procedure: RIGHT AXILLARY SENTINEL LYMPHNODE BIOPSY;  Surgeon: Adin Hector, MD;  Location: [facility];  Service: General;  Laterality: Right;   BREAST LUMPECTOMY Right 12/16/2013   Procedure: LUMPECTOMY;  Surgeon: Adin Hector, MD;  Location: [facility];  Service: General;  Laterality: Right;   TUBAL LIGATION  ALLERGIES:  Allergies  Allergen Reactions   Peanut-Containing Drug Products Swelling   Codeine Nausea And Vomiting     CURRENT MEDICATIONS:  Outpatient Encounter Medications as of 07/22/2021  Medication Sig   ciclopirox (PENLAC) 8 % solution Apply topically at bedtime. Apply over nail and surrounding skin. Apply daily over previous coat. After seven (7) days, may remove with alcohol and  continue cycle.   terbinafine (LAMISIL) 250 MG tablet Take 1 tablet (250 mg total) by mouth daily.   triamcinolone (KENALOG) 0.025 % ointment Apply 1 application topically 2 (two) times daily.   No facility-administered encounter medications on file as of 07/22/2021.     ONCOLOGIC FAMILY HISTORY:  Family History  Problem Relation Age of Onset   Hypertension Sister    Hypertension Brother    Breast cancer Neg Hx     GENETIC COUNSELING/TESTING: Not at this time  SOCIAL HISTORY:  Social History   Socioeconomic History   Marital status: Widowed    Spouse name: Not on file   Number of children: 2   Years of education: Not on file   Highest education level: Not on file  Occupational History   Occupation: COOK    Employer: [employer]  Tobacco Use   Smoking status: Former    Types: Cigarettes    Quit date: 12/10/1990    Years since quitting: 30.6   Smokeless tobacco: Never  Vaping Use   Vaping Use: Never used  Substance and Sexual Activity   Alcohol use: No   Drug use: No   Sexual activity: Not Currently  Other Topics Concern   Not on file  Social History Narrative   Not on file   Social Determinants of Health   Financial Resource Strain: Not on file  Food Insecurity: Not on file  Transportation Needs: Not on file  Physical Activity: Not on file  Stress: Not on file  Social Connections: Not on file  Intimate Partner Violence: Not on file     PHYSICAL EXAMINATION:  Vital Signs: Vitals:   07/22/21 1129  BP: 128/69  Pulse: 82  Resp: 18  Temp: 97.8 F (36.6 C)  SpO2: 100%   Filed Weights   07/22/21 1129  Weight: 175 lb (79.4 kg)   General: Well-nourished, well-appearing female in no acute distress.  Unaccompanied today.   HEENT: Head is normocephalic.  Pupils equal and reactive to light. Conjunctivae clear without exudate.  Sclerae anicteric. Mask in place.  Lymph: No cervical, supraclavicular, or infraclavicular lymphadenopathy noted on palpation.   Cardiovascular: Regular rate and rhythm.Marland Kitchen Respiratory: Clear to auscultation bilaterally. Chest expansion symmetric; breathing non-labored.  Breast Exam:  -Left breast: No appreciable masses on palpation. No skin redness, thickening, or peau d'orange appearance; no nipple retraction or nipple discharge; mild distortion in symmetry at previous lumpectomy site well healed scar without erythema or nodularity.  -Right breast: No appreciable masses on palpation. No skin redness, thickening, or peau d'orange appearance; no nipple retraction or nipple discharge. -Axilla: No axillary adenopathy bilaterally.  GI: Abdomen soft and round; non-tender, non-distended. Bowel sounds normoactive. No hepatosplenomegaly.   GU: Deferred.  Neuro: No focal deficits. Steady gait.  Psych: Mood and affect normal and appropriate for situation.  MSK: No focal spinal tenderness to palpation, full range of motion in bilateral upper extremities Extremities: No edema. Skin: Warm and dry.  LABORATORY DATA:  No visits with results within 1 Day(s) from this visit.  Latest known visit with results is:  Appointment on 07/22/2020  Component  Date Value Ref Range Status   Sodium 07/22/2020 140  135 - 145 mmol/L Final   Potassium 07/22/2020 4.2  3.5 - 5.1 mmol/L Final   Chloride 07/22/2020 107  98 - 111 mmol/L Final   CO2 07/22/2020 27  22 - 32 mmol/L Final   Glucose, Bld 07/22/2020 89  70 - 99 mg/dL Final   Glucose reference range applies only to samples taken after fasting for at least 8 hours.   BUN 07/22/2020 16  8 - 23 mg/dL Final   Creatinine, Ser 07/22/2020 1.12 (A) 0.44 - 1.00 mg/dL Final   Calcium 07/22/2020 9.8  8.9 - 10.3 mg/dL Final   Total Protein 07/22/2020 7.0  6.5 - 8.1 g/dL Final   Albumin 07/22/2020 3.7  3.5 - 5.0 g/dL Final   AST 07/22/2020 21  15 - 41 U/L Final   ALT 07/22/2020 18  0 - 44 U/L Final   Alkaline Phosphatase 07/22/2020 83  38 - 126 U/L Final   Total Bilirubin 07/22/2020 0.5  0.3 - 1.2  mg/dL Final   GFR calc non Af Amer 07/22/2020 50 (A) >60 mL/min Final   Anion gap 07/22/2020 6  5 - 15 Final   Performed at [facility], [place] [address], [place], [place] [address]   WBC 07/22/2020 5.4  4.0 - 10.5 K/uL Final   RBC 07/22/2020 4.68  3.87 - 5.11 MIL/uL Final   Hemoglobin 07/22/2020 12.6  12.0 - 15.0 g/dL Final   HCT 07/22/2020 38.9  36.0 - 46.0 % Final   MCV 07/22/2020 83.1  80.0 - 100.0 fL Final   MCH 07/22/2020 26.9  26.0 - 34.0 pg Final   MCHC 07/22/2020 32.4  30.0 - 36.0 g/dL Final   RDW 07/22/2020 13.6  11.5 - 15.5 % Final   Platelets 07/22/2020 152  150 - 400 K/uL Final   nRBC 07/22/2020 0.0  0.0 - 0.2 % Final   Neutrophils Relative % 07/22/2020 51  % Final   Neutro Abs 07/22/2020 2.8  1.7 - 7.7 K/uL Final   Lymphocytes Relative 07/22/2020 36  % Final   Lymphs Abs 07/22/2020 2.0  0.7 - 4.0 K/uL Final   Monocytes Relative 07/22/2020 9  % Final   Monocytes Absolute 07/22/2020 0.5  0.1 - 1.0 K/uL Final   Eosinophils Relative 07/22/2020 2  % Final   Eosinophils Absolute 07/22/2020 0.1  0.0 - 0.5 K/uL Final   Basophils Relative 07/22/2020 1  % Final   Basophils Absolute 07/22/2020 0.0  0.0 - 0.1 K/uL Final   Immature Granulocytes 07/22/2020 1  % Final   Abs Immature Granulocytes 07/22/2020 0.04  0.00 - 0.07 K/uL Final   Performed at [facility], [place] [address], [place], [place] [address]     DIAGNOSTIC IMAGING:  Most recent mammogram:    CLINICAL DATA:  Screening.   EXAM: DIGITAL SCREENING BILATERAL MAMMOGRAM WITH TOMOSYNTHESIS AND CAD   TECHNIQUE: Bilateral screening digital craniocaudal and mediolateral oblique mammograms were obtained. Bilateral screening digital breast tomosynthesis was performed. The images were evaluated with computer-aided detection.   COMPARISON:  Previous exam(s).   ACR Breast Density Category b: There are scattered areas of fibroglandular density.   FINDINGS: There are no  findings suspicious for malignancy.   IMPRESSION: No mammographic evidence of malignancy. A result letter of this screening mammogram will be mailed directly to the patient.   RECOMMENDATION: Screening mammogram in one year. (Code:SM-B-01Y)   BI-RADS CATEGORY  1: Negative.  Electronically Signed   By: Lillia Mountain M.D.   On: 06/28/2021 16:20  ASSESSMENT AND PLAN:  [name] [name] is a pleasant 70 y.o. female with history of Stage IIA right breast invasive papillary carcinoma, ER+/PR+/HER2-, diagnosed in 12/2013, treated with lumpectomy, adjuvant radiation therapy, and anti-estrogen therapy with Tamoxifen x 5 years completing therapy in 08/2019.  She [facility] to [facility] for surveillance and routine follow-up.   1. History of breast cancer:  [name] [name] is currently clinically and radiographically without evidence of disease or recurrence of breast cancer. She will be due for mammogram in 06/2022; orders placed today.  I reviewed the role of long term survivorship clinic and she would like to return annually.   I discussed with her that she doesn't have to see Dr. Georgiann [name] and [place], but she noted she would like to return annually.  I encouraged her to call me with any questions or concerns before her next visit at the cancer center, and I would be happy to see her sooner, if needed.    2. Bone health:    She was given education on specific food and activities to promote bone health.  3. Cancer screening:  Due to [name] [name]'s history and her age, she should receive screening for skin cancers, colon cancer. She was encouraged to follow-up with her PCP for appropriate cancer screenings.   4. Health maintenance and wellness promotion: [name] [name] was encouraged to consume 5-7 servings of fruits and vegetables per day. She was also encouraged to engage in moderate to vigorous exercise for 30 minutes per day most days of the week. She was instructed to limit her alcohol consumption and  continue to abstain from tobacco use.    Dispo:  -Return to cancer center in one year for LTS f/u -Mammogram 06/2022  Total encounter time: 20 minutes*  Wilber Bihari, NP 07/22/21 11:34 AM Medical Oncology and Hematology [facility] [place], [place] [address] Tel. [phone]    Fax. [phone]  *Total Encounter Time as defined by the Centers for Medicare and Medicaid Services includes, in addition to the face-to-face time of a patient visit (documented in the note above) non-face-to-face time: obtaining and reviewing outside history, ordering and reviewing medications, tests or procedures, care coordination (communications with other health care professionals or caregivers) and documentation in the medical record.    Note: PRIMARY CARE PROVIDER Pcp, No None None

## 2021-09-06 ENCOUNTER — Other Ambulatory Visit: Payer: Self-pay

## 2021-09-06 ENCOUNTER — Ambulatory Visit: Payer: Medicare HMO | Admitting: Podiatry

## 2021-09-06 DIAGNOSIS — B351 Tinea unguium: Secondary | ICD-10-CM

## 2021-09-06 MED ORDER — CICLOPIROX 8 % EX SOLN: Freq: Every day | CUTANEOUS | 0 refills | Status: DC

## 2021-09-06 MED ORDER — TERBINAFINE HCL 250 MG PO TABS: 250.0000 mg | ORAL_TABLET | Freq: Every day | ORAL | 0 refills | Status: AC

## 2021-09-06 NOTE — Progress Notes (Signed)
  Subjective:  Patient ID: [name] [name], female    DOB: 09/04/1951,  MRN: [id number]  Chief Complaint  Patient presents with   Nail Problem    follow up after nail fungus treatment    70 y.o. female presents with the above complaint. History confirmed with patient.  She notes changes in improvement the nails have been somewhat lifting off and become brittle  Objective:  Physical Exam: warm, good capillary refill, no trophic changes or ulcerative lesions, normal DP and PT pulses, and normal sensory exam.  Improvement with proximal clearing and lifting of the nail plate        Assessment:   1. Onychomycosis       Plan:  Patient was evaluated and treated and all questions answered.  Overall is had quite bit of improvement and I have seen proximal clearing nail.  The nail plate is lifting off with the old fungal portions of the nail and I debrided this to in length and thickness today with a sharp nail nipper.  Recommend we continue her therapy with Lamisil and Penlac.  She will return to see me in 4 months  Return in about 4 months (around 01/04/2022) for follow up after nail fungus treatment.

## 2021-09-22 ENCOUNTER — Encounter: Payer: Self-pay | Admitting: Internal Medicine

## 2021-09-22 ENCOUNTER — Other Ambulatory Visit: Payer: Self-pay

## 2021-09-22 ENCOUNTER — Ambulatory Visit (INDEPENDENT_AMBULATORY_CARE_PROVIDER_SITE_OTHER): Payer: Medicare HMO | Admitting: Internal Medicine

## 2021-09-22 VITALS — BP 124/76 | HR 73 | Temp 98.7°F | Ht 62.25 in | Wt 173.0 lb

## 2021-09-22 DIAGNOSIS — N1831 Chronic kidney disease, stage 3a: Secondary | ICD-10-CM | POA: Diagnosis not present

## 2021-09-22 DIAGNOSIS — R7303 Prediabetes: Secondary | ICD-10-CM | POA: Diagnosis not present

## 2021-09-22 DIAGNOSIS — Z0000 Encounter for general adult medical examination without abnormal findings: Secondary | ICD-10-CM

## 2021-09-22 DIAGNOSIS — Z1159 Encounter for screening for other viral diseases: Secondary | ICD-10-CM | POA: Insufficient documentation

## 2021-09-22 DIAGNOSIS — Z23 Encounter for immunization: Secondary | ICD-10-CM | POA: Diagnosis not present

## 2021-09-22 DIAGNOSIS — E785 Hyperlipidemia, unspecified: Secondary | ICD-10-CM | POA: Diagnosis not present

## 2021-09-22 DIAGNOSIS — Z0001 Encounter for general adult medical examination with abnormal findings: Secondary | ICD-10-CM | POA: Insufficient documentation

## 2021-09-22 LAB — TSH: TSH: 3.32 u[IU]/mL (ref 0.35–5.50)

## 2021-09-22 LAB — HEPATIC FUNCTION PANEL
ALT: 15 U/L (ref 0–35)
AST: 19 U/L (ref 0–37)
Albumin: 4.2 g/dL (ref 3.5–5.2)
Alkaline Phosphatase: 79 U/L (ref 39–117)
Bilirubin, Direct: 0.1 mg/dL (ref 0.0–0.3)
Total Bilirubin: 0.7 mg/dL (ref 0.2–1.2)
Total Protein: 7.1 g/dL (ref 6.0–8.3)

## 2021-09-22 LAB — BASIC METABOLIC PANEL
BUN: 19 mg/dL (ref 6–23)
CO2: 27 mEq/L (ref 19–32)
Calcium: 9.7 mg/dL (ref 8.4–10.5)
Chloride: 107 mEq/L (ref 96–112)
Creatinine, Ser: 1.06 mg/dL (ref 0.40–1.20)
GFR: 52.63 mL/min — ABNORMAL LOW (ref >60.00)
Glucose, Bld: 88 mg/dL (ref 70–99)
Potassium: 3.9 mEq/L (ref 3.5–5.1)
Sodium: 140 mEq/L (ref 135–145)

## 2021-09-22 LAB — CBC WITH DIFFERENTIAL/PLATELET
Basophils Absolute: 0 10*3/uL (ref 0.0–0.1)
Basophils Relative: 0.7 % (ref 0.0–3.0)
Eosinophils Absolute: 0.1 10*3/uL (ref 0.0–0.7)
Eosinophils Relative: 2.2 % (ref 0.0–5.0)
HCT: 41.6 % (ref 36.0–46.0)
Hemoglobin: 13.5 g/dL (ref 12.0–15.0)
Lymphocytes Relative: 34 % (ref 12.0–46.0)
Lymphs Abs: 1.4 10*3/uL (ref 0.7–4.0)
MCHC: 32.4 g/dL (ref 30.0–36.0)
MCV: 83 fl (ref 78.0–100.0)
Monocytes Absolute: 0.3 10*3/uL (ref 0.1–1.0)
Monocytes Relative: 7.6 % (ref 3.0–12.0)
Neutro Abs: 2.3 10*3/uL (ref 1.4–7.7)
Neutrophils Relative %: 55.5 % (ref 43.0–77.0)
Platelets: 152 10*3/uL (ref 150.0–400.0)
RBC: 5.02 Mil/uL (ref 3.87–5.11)
RDW: 14 % (ref 11.5–15.5)
WBC: 4.2 10*3/uL (ref 4.0–10.5)

## 2021-09-22 LAB — LIPID PANEL
Cholesterol: 205 mg/dL — ABNORMAL HIGH (ref 0–200)
HDL: 43.3 mg/dL (ref >39.00)
LDL Cholesterol: 130 mg/dL — ABNORMAL HIGH (ref 0–99)
NonHDL: 161.66
Total CHOL/HDL Ratio: 5
Triglycerides: 159 mg/dL — ABNORMAL HIGH (ref 0.0–149.0)
VLDL: 31.8 mg/dL (ref 0.0–40.0)

## 2021-09-22 LAB — HEMOGLOBIN A1C: Hgb A1c MFr Bld: 6.2 % (ref 4.6–6.5)

## 2021-09-22 MED ORDER — BOOSTRIX 5-2.5-18.5 LF-MCG/0.5 IM SUSP: 0.5000 mL | Freq: Once | INTRAMUSCULAR | 0 refills | Status: AC

## 2021-09-22 NOTE — Progress Notes (Signed)
Subjective:  Patient ID: [name] [name], female    DOB: 09-18-1951  Age: 70 y.o. MRN: [id number]  CC: Annual Exam  This visit occurred during the SARS-CoV-2 public health emergency.  Safety protocols were in place, including screening questions prior to the visit, additional usage of staff PPE, and extensive cleaning of exam room while observing appropriate contact time as indicated for disinfecting solutions.    HPI [name] [name] presents for a CPX and to establish.  She walks one mile/day and climbs stairs and does not experience CP, DOE, palpitations, edema, fatigue, or diaphoresis  History [name] [name] has a past medical history of Arthritis, Cancer ([place]), Full dentures, Personal history of radiation therapy, Radiation, and Wears glasses.   She has a past surgical history that includes Appendectomy; Tubal ligation; Breast lumpectomy (Right, 12/16/2013); and Axillary sentinel node biopsy (Right, 01/29/2014).   [name] [name] family history includes Hypertension in [name] [name] brother and sister.She reports that she quit smoking about 30 years ago. [name] [name] smoking use included cigarettes. She has never used smokeless tobacco. She reports that she does not drink alcohol and does not use drugs.  Outpatient Medications Prior to Visit  Medication Sig Dispense Refill   ciclopirox (PENLAC) 8 % solution Apply topically at bedtime. Apply over nail and surrounding skin. Apply daily over previous coat. After seven (7) days, may remove with alcohol and continue cycle. 6.6 mL 0   terbinafine (LAMISIL) 250 MG tablet Take 1 tablet (250 mg total) by mouth daily. 90 tablet 0   No facility-administered medications prior to visit.    ROS Review of Systems  Constitutional:  Negative for diaphoresis, fatigue and unexpected weight change.  HENT: Negative.    Eyes: Negative.   Respiratory:  Negative for apnea, cough, chest tightness, shortness of breath and wheezing.   Cardiovascular:  Negative for chest pain,  palpitations and leg swelling.  Gastrointestinal:  Negative for abdominal pain, diarrhea, nausea and vomiting.  Endocrine: Negative.   Genitourinary: Negative.  Negative for difficulty urinating.  Musculoskeletal:  Negative for arthralgias and myalgias.  Skin: Negative.  Negative for rash.  Neurological:  Negative for dizziness, weakness, light-headedness and headaches.  Hematological:  Negative for adenopathy. Does not bruise/bleed easily.  Psychiatric/Behavioral: Negative.     Objective:  BP 124/76 (BP Location: Right Arm, Patient Position: Sitting, Cuff Size: Large)   Pulse 73   Temp 98.7 F (37.1 C) (Oral)   Ht 5' 2.25" (1.581 m)   Wt 173 lb (78.5 kg)   SpO2 97%   BMI 31.39 kg/m   Physical Exam Vitals reviewed.  Constitutional:      Appearance: Normal appearance.  HENT:     Nose: Nose normal.     Mouth/Throat:     Mouth: Mucous membranes are moist.  Eyes:     General: No scleral icterus.    Conjunctiva/sclera: Conjunctivae normal.  Cardiovascular:     Rate and Rhythm: Normal rate and regular rhythm.     Heart sounds: No murmur heard. Pulmonary:     Effort: Pulmonary effort is normal.     Breath sounds: No stridor. No wheezing, rhonchi or rales.  Abdominal:     General: Abdomen is flat.     Palpations: There is no mass.     Tenderness: There is no abdominal tenderness. There is no guarding.     Hernia: No hernia is present.  Musculoskeletal:        General: Normal range of motion.     Cervical back: Neck  supple.     Right lower leg: No edema.     Left lower leg: No edema.  Lymphadenopathy:     Cervical: No cervical adenopathy.  Skin:    General: Skin is warm and dry.     Coloration: Skin is not pale.     Findings: No rash.  Neurological:     General: No focal deficit present.     Mental Status: She is alert. Mental status is at baseline.  Psychiatric:        Mood and Affect: Mood normal.        Behavior: Behavior normal.    Lab Results  Component  Value Date   WBC 4.2 09/22/2021   HGB 13.5 09/22/2021   HCT 41.6 09/22/2021   PLT 152.0 09/22/2021   GLUCOSE 88 09/22/2021   CHOL 205 (H) 09/22/2021   TRIG 159.0 (H) 09/22/2021   HDL 43.30 09/22/2021   LDLCALC 130 (H) 09/22/2021   ALT 15 09/22/2021   AST 19 09/22/2021   NA 140 09/22/2021   K 3.9 09/22/2021   CL 107 09/22/2021   CREATININE 1.06 09/22/2021   BUN 19 09/22/2021   CO2 27 09/22/2021   TSH 3.32 09/22/2021   HGBA1C 6.2 09/22/2021     Assessment & Plan:   [name] [name] was seen today for annual exam.  Diagnoses and all orders for this visit:  Encounter for general adult medical examination with abnormal findings- Exam completed, labs reviewed, vaccines reviewed and updated, cancer screenings are up-to-date, patient education was given.  Hyperlipidemia with target LDL less than 130- [name] [name] ASCVD risk score is less than 15% so I did not recommend a statin for CV risk reduction. -     CBC with Differential/Platelet; Future -     Hepatic function panel; Future -     TSH; Future -     Lipid panel; Future -     Lipid panel -     TSH -     Hepatic function panel -     CBC with Differential/Platelet  Prediabetes- [name] [name] A1c is at 6.2%.  Medical therapy is not yet indicated. -     CBC with Differential/Platelet; Future -     Hemoglobin A1c; Future -     Basic metabolic panel; Future -     Basic metabolic panel -     Hemoglobin A1c -     CBC with Differential/Platelet  Need for hepatitis C screening test -     Hepatitis C antibody; Future -     Hepatitis C antibody  Need for vaccination -     Pneumococcal conjugate vaccine 20-valent (Prevnar 20)  Flu vaccine need -     Flu Vaccine QUAD High Dose(Fluad)  Need for prophylactic vaccination with combined diphtheria-tetanus-pertussis (DTP) vaccine -     Tdap (BOOSTRIX) 5-2.5-18.5 LF-MCG/0.5 injection; Inject 0.5 mLs into the muscle once for 1 dose.  Stage 3a chronic kidney disease ([place])- She will avoid nephrotoxic  agents.  I am having [name] [name]. [name] [name] start on Boostrix. I am also having [name] [name] maintain [name] [name] ciclopirox and terbinafine.  Meds ordered this encounter  Medications   Tdap (BOOSTRIX) 5-2.5-18.5 LF-MCG/0.5 injection    Sig: Inject 0.5 mLs into the muscle once for 1 dose.    Dispense:  0.5 mL    Refill:  0      Follow-up: Return in about 6 months (around 03/23/2022).  [name] Calico, MD

## 2021-09-22 NOTE — Patient Instructions (Signed)

## 2021-09-23 LAB — HEPATITIS C ANTIBODY
Hepatitis C Ab: NONREACTIVE
SIGNAL TO CUT-OFF: 0.03 (ref <1.00)

## 2021-09-24 ENCOUNTER — Encounter: Payer: Self-pay | Admitting: Internal Medicine

## 2021-09-25 ENCOUNTER — Encounter: Payer: Self-pay | Admitting: Internal Medicine

## 2021-10-24 IMAGING — MG MM DIGITAL SCREENING BILAT W/ TOMO AND CAD
6 of 10 series · 6 of 30 positions shown · non-contrast
Comparison: Previous exam(s).

CLINICAL DATA: Screening.

EXAM:
DIGITAL SCREENING BILATERAL MAMMOGRAM WITH TOMOSYNTHESIS AND CAD
TECHNIQUE: Bilateral screening digital craniocaudal and mediolateral oblique
mammograms were obtained. Bilateral screening digital breast
tomosynthesis was performed. The images were evaluated with
computer-aided detection.

[R CC synth-2D]
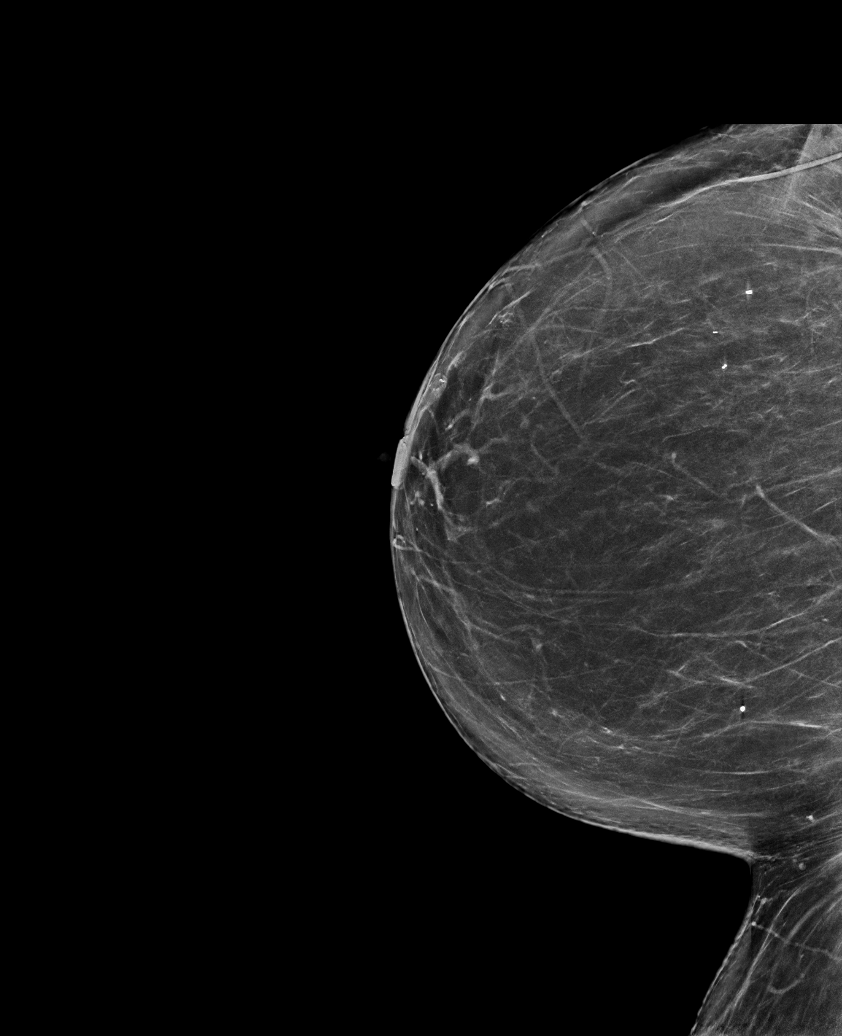

[L MLO synth-2D]
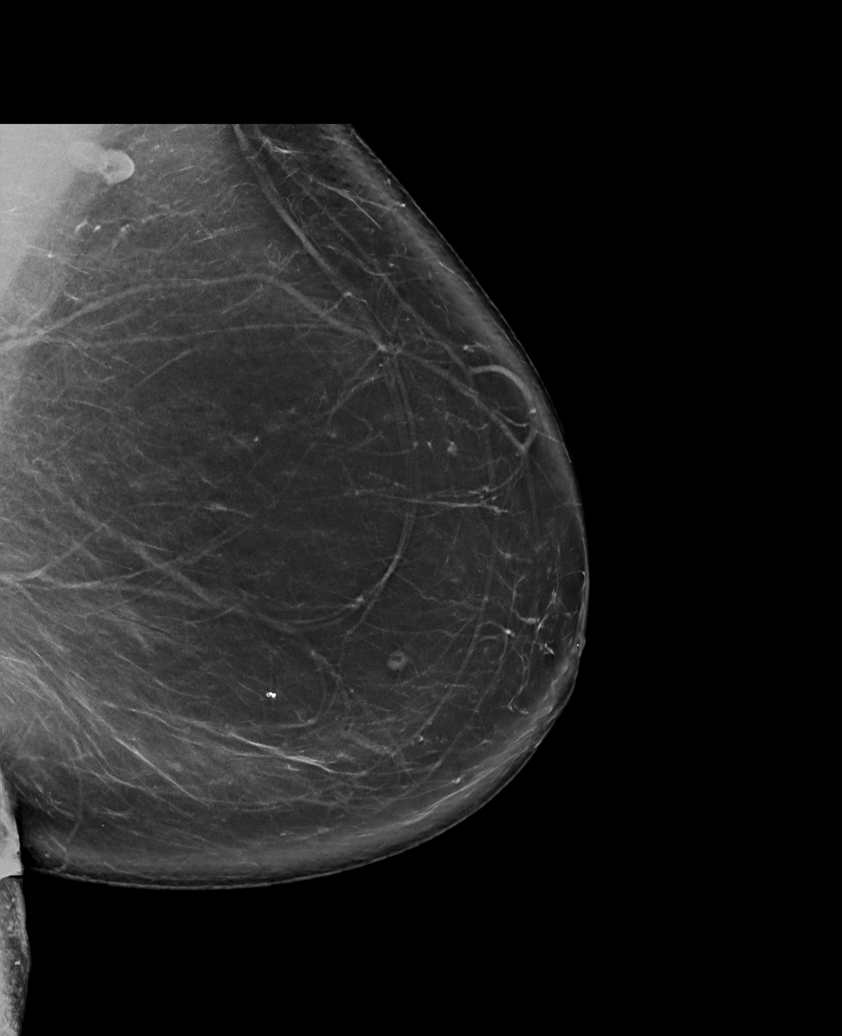

[R MLO synth-2D (1 of 2)]
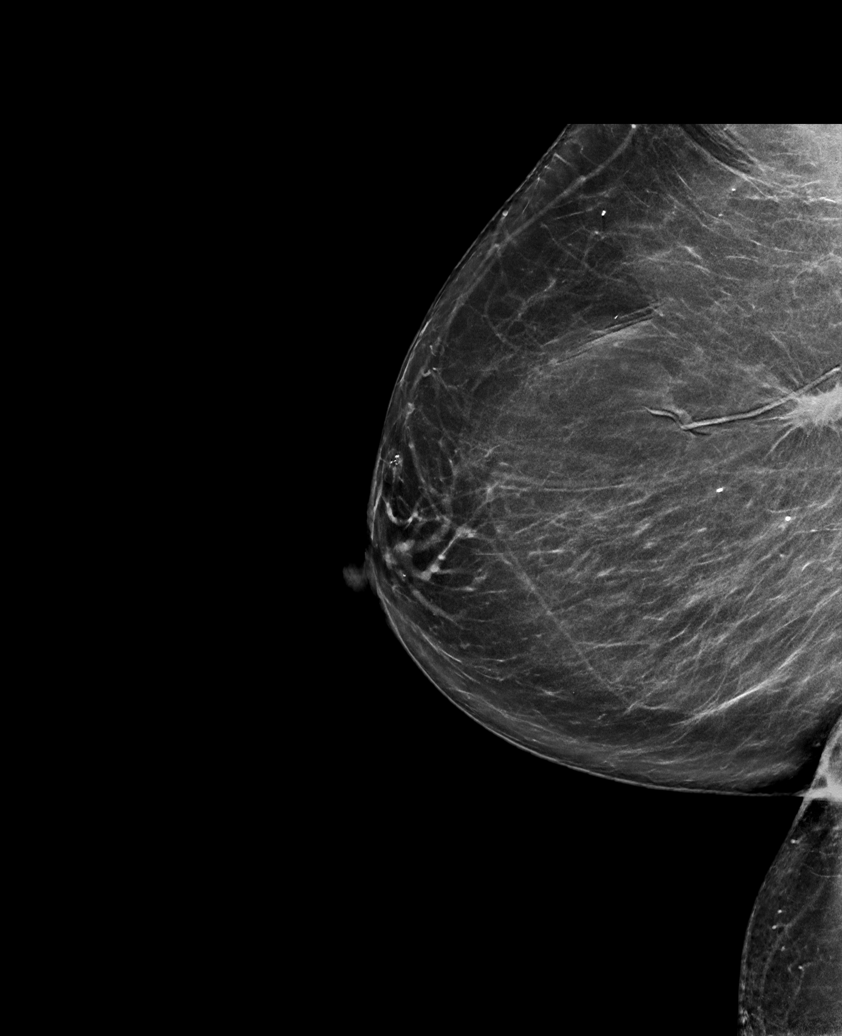

[L CC synth-2D]
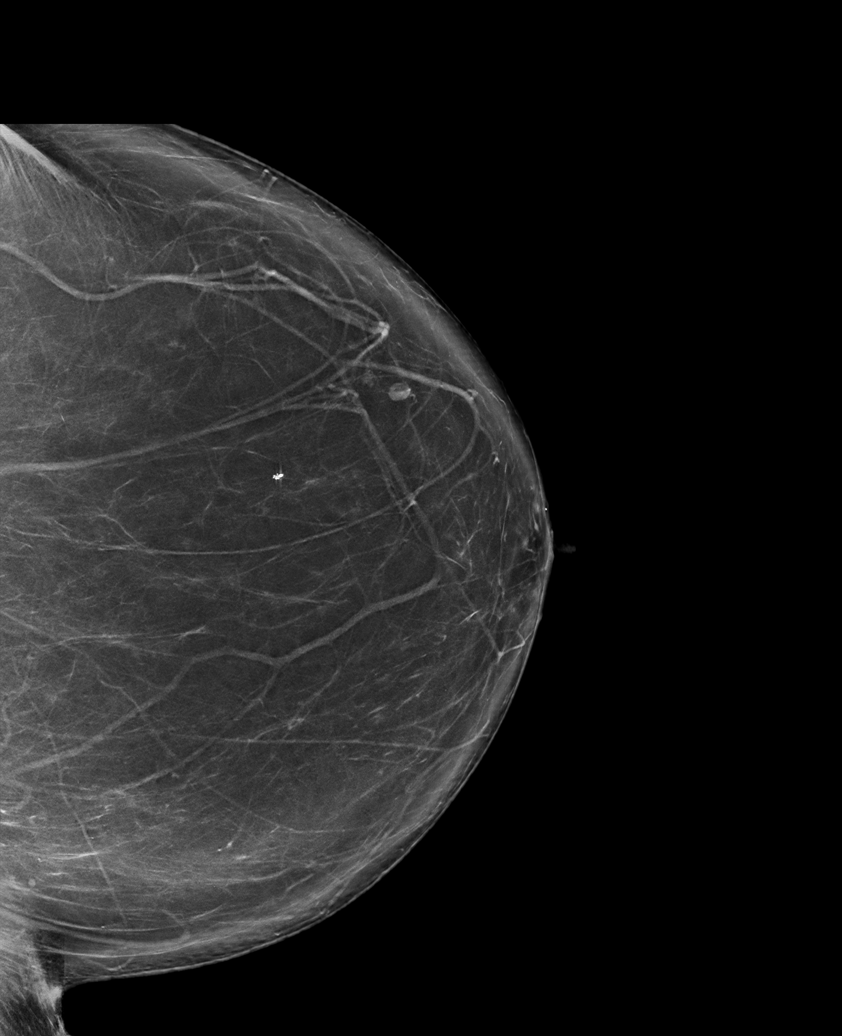

[R MLO synth-2D (2 of 2)]
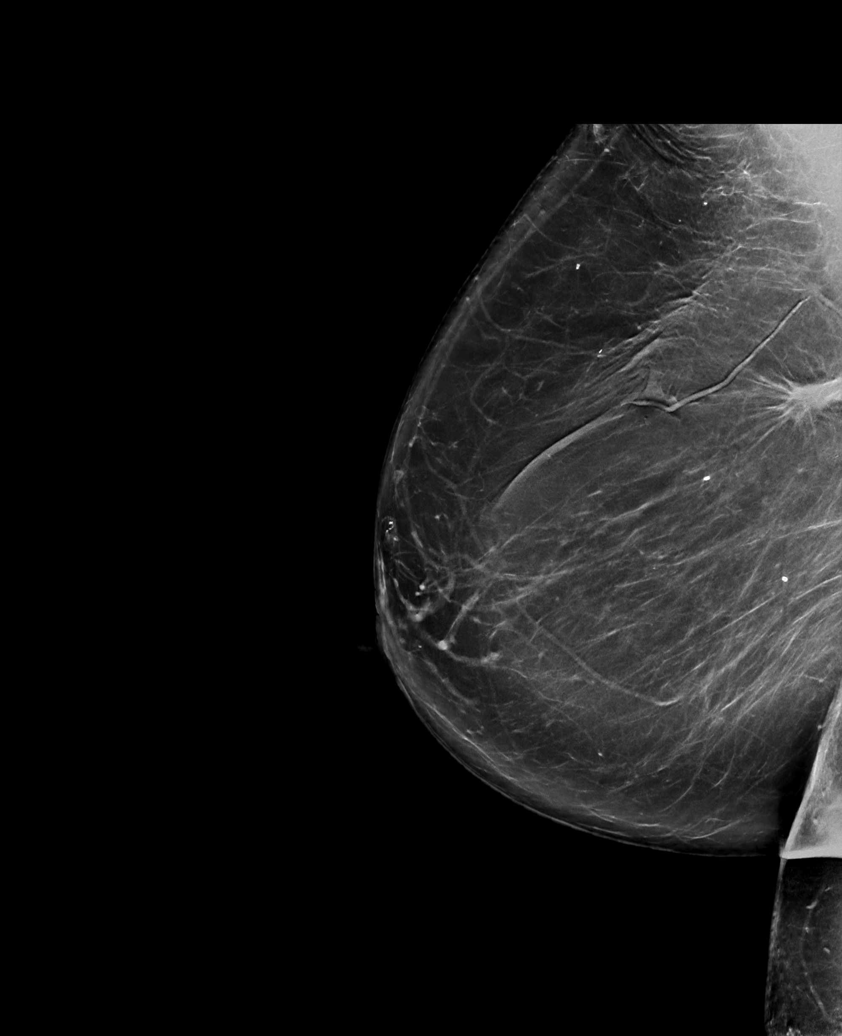

[R MLO tomo · tomo slice 29/57.0]
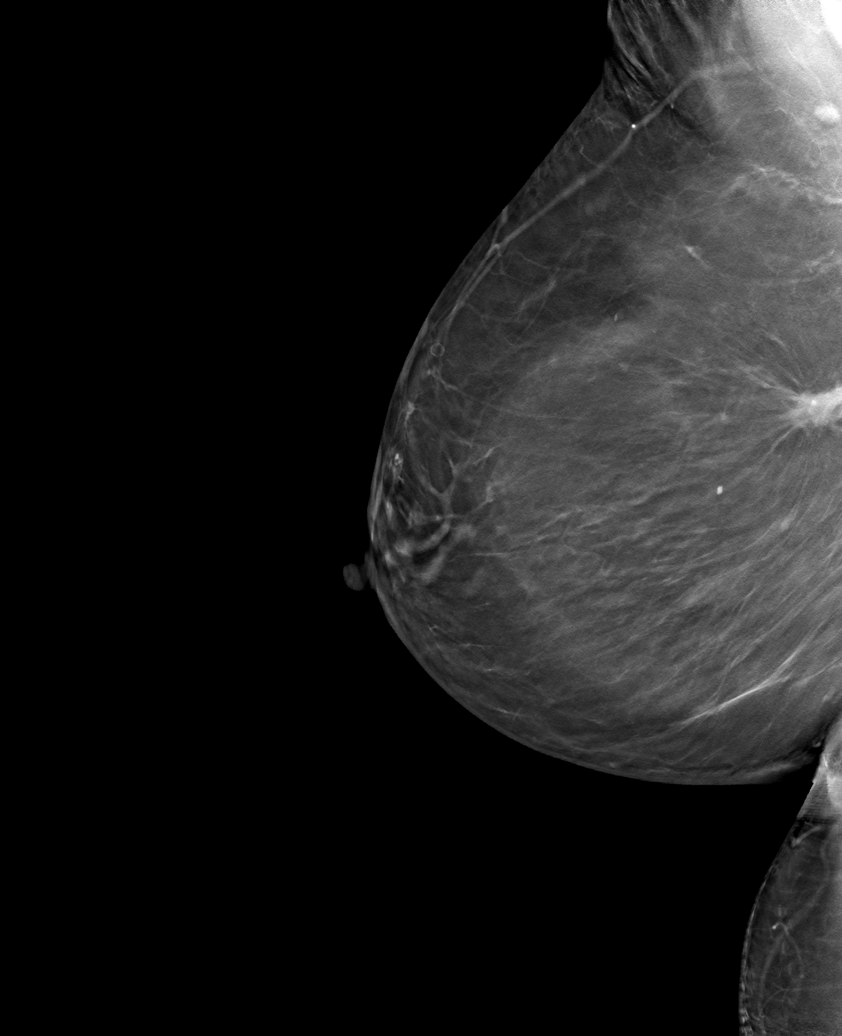

[6 of 30 positions shown; findings below may reference images not displayed]

ACR Breast Density Category b: There are scattered areas of
fibroglandular density.
FINDINGS: There are no findings suspicious for malignancy.
IMPRESSION: No mammographic evidence of malignancy. A result letter of this
screening mammogram will be mailed directly to the patient.

RECOMMENDATION:
Screening mammogram in one year. (Code:51-O-LD2)

BI-RADS CATEGORY  1: Negative.

## 2021-11-21 ENCOUNTER — Encounter: Payer: Self-pay | Admitting: Emergency Medicine

## 2021-11-21 ENCOUNTER — Other Ambulatory Visit: Payer: Self-pay

## 2021-11-21 ENCOUNTER — Ambulatory Visit
Admission: EM | Admit: 2021-11-21 | Discharge: 2021-11-21 | Disposition: A | Discharge status: Home / Self Care | Payer: Medicare HMO | Attending: Physician Assistant | Admitting: Physician Assistant

## 2021-11-21 DIAGNOSIS — M546 Pain in thoracic spine: Secondary | ICD-10-CM | POA: Diagnosis not present

## 2021-11-21 MED ORDER — CYCLOBENZAPRINE HCL 10 MG PO TABS: 10.0000 mg | ORAL_TABLET | Freq: Two times a day (BID) | ORAL | 0 refills | Status: DC | PRN

## 2021-11-21 NOTE — ED Triage Notes (Signed)
Pt sts mid back pain x 2 days that she thinks is muscle spasms; pt denies dysuria or trauma

## 2021-11-21 NOTE — ED Provider Notes (Signed)
[place]    CSN: [id number] Arrival date & time: 11/21/21  1602      History   Chief Complaint Chief Complaint  Patient presents with   Back Pain    HPI [name] [name] is a 71 y.o. female.   Patient here today for evaluation of thoracic back pain that started 2 days ago. She denies any injury to the area that she is aware of. She reports that pain feels like muscle spasms and is located more to the left of her spine. She has not had any dysuria or fever. She denies any numbness or tingling. Movement worsens pain.She has tried OTC meds without significant relief.   The history is provided by the patient.  Back Pain Associated symptoms: no abdominal pain, no dysuria, no fever and no numbness    Past Medical History:  Diagnosis Date   Arthritis    right knee   Cancer ([place])    breast   Full dentures    Personal history of radiation therapy    Radiation    Left Breast   Wears glasses     Patient Active Problem List   Diagnosis Date Noted   Hyperlipidemia with target LDL less than 130 09/22/2021   Encounter for general adult medical examination with abnormal findings 09/22/2021   Prediabetes 09/22/2021   Need for hepatitis C screening test 09/22/2021   Stage 3a chronic kidney disease ([place]) 09/22/2021   Need for prophylactic vaccination with combined diphtheria-tetanus-pertussis (DTP) vaccine 09/22/2021   Flu vaccine need 09/22/2021   Need for vaccination 09/22/2021   Malignant neoplasm of lower-inner quadrant of right breast of female, estrogen receptor positive ([place]) 12/31/2013    Past Surgical History:  Procedure Laterality Date   APPENDECTOMY     AXILLARY SENTINEL NODE BIOPSY Right 01/29/2014   Procedure: RIGHT AXILLARY SENTINEL LYMPHNODE BIOPSY;  Surgeon: Adin Hector, MD;  Location: [place];  Service: General;  Laterality: Right;   BREAST LUMPECTOMY Right 12/16/2013   Procedure: LUMPECTOMY;  Surgeon: Adin Hector, MD;   Location: [place];  Service: General;  Laterality: Right;   TUBAL LIGATION      OB History   No obstetric history on file.      Home Medications    Prior to Admission medications   Medication Sig Start Date End Date Taking? Authorizing Provider  cyclobenzaprine (FLEXERIL) 10 MG tablet Take 1 tablet (10 mg total) by mouth 2 (two) times daily as needed for muscle spasms. 11/21/21  Yes Francene Finders, PA-C  ciclopirox [facility]) 8 % solution Apply topically at bedtime. Apply over nail and surrounding skin. Apply daily over previous coat. After seven (7) days, may remove with alcohol and continue cycle. 09/06/21   McDonald, Stephan Minister, DPM  terbinafine (LAMISIL) 250 MG tablet Take 1 tablet (250 mg total) by mouth daily. 09/06/21 12/05/21  Criselda Peaches, DPM    Family History Family History  Problem Relation Age of Onset   Hypertension Sister    Heart failure Sister    Diabetes Sister    Diabetes Brother    Hypertension Brother    Diabetes Brother    Breast cancer Neg Hx     Social History Social History   Tobacco Use   Smoking status: Former    Types: Cigarettes    Quit date: 12/10/1990    Years since quitting: 30.9   Smokeless tobacco: Never  Vaping Use   Vaping Use: Never used  Substance Use Topics   Alcohol use: No   Drug use: No     Allergies   Peanut-containing drug products and Codeine   Review of Systems Review of Systems  Constitutional:  Negative for chills and fever.  Eyes:  Negative for discharge and redness.  Respiratory:  Negative for shortness of breath.   Gastrointestinal:  Negative for abdominal pain, nausea and vomiting.  Genitourinary:  Negative for dysuria.  Musculoskeletal:  Positive for back pain.  Neurological:  Negative for numbness.    Physical Exam Triage Vital Signs ED Triage Vitals [11/21/21 1606]  Enc Vitals Group     BP      Pulse      Resp      Temp      Temp src      SpO2      Weight      Height       Head Circumference      Peak Flow      Pain Score 8     Pain Loc      Pain Edu?      Excl. in [place]?    No data found.  Updated Vital Signs BP 112/76 (BP Location: Left Arm)    Pulse 90    Temp 98 F (36.7 C) (Oral)    Resp 18    SpO2 95%      Physical Exam Vitals and nursing note reviewed.  Constitutional:      General: She is not in acute distress.    Appearance: Normal appearance. She is not ill-appearing.  HENT:     Head: Normocephalic and atraumatic.  Eyes:     Conjunctiva/sclera: Conjunctivae normal.  Cardiovascular:     Rate and Rhythm: Normal rate.  Pulmonary:     Effort: Pulmonary effort is normal.  Musculoskeletal:     Comments: No TTP to midline thoracic spine, Mild TTP with noted muscle spasm to left thoracic paraspinal area  Neurological:     Mental Status: She is alert.  Psychiatric:        Mood and Affect: Mood normal.        Behavior: Behavior normal.        Thought Content: Thought content normal.     UC Treatments / Results  Labs (all labs ordered are listed, but only abnormal results are displayed) Labs Reviewed - No data to display  EKG   Radiology No results found.  Procedures Procedures (including critical care time)  Medications Ordered in UC Medications - No data to display  Initial Impression / Assessment and Plan / UC Course  I have reviewed the triage vital signs and the nursing notes.  Pertinent labs & imaging results that were available during my care of the patient were reviewed by me and considered in my medical decision making (see chart for details).    Flexeril prescribed to hopefully alleviate pain but advised patient to use with caution as it may cause drowsiness. Encouraged follow up with any further concerns.   Final Clinical Impressions(s) / UC Diagnoses   Final diagnoses:  Acute left-sided thoracic back pain   Discharge Instructions   None    ED Prescriptions     Medication Sig Dispense Auth. Provider    cyclobenzaprine (FLEXERIL) 10 MG tablet Take 1 tablet (10 mg total) by mouth 2 (two) times daily as needed for muscle spasms. 20 tablet Francene Finders, PA-C      PDMP not reviewed this encounter.  Francene Finders, PA-C 11/22/21 [phone]

## 2021-11-24 ENCOUNTER — Emergency Department (HOSPITAL_COMMUNITY): Payer: Medicare HMO

## 2021-11-24 ENCOUNTER — Emergency Department (HOSPITAL_COMMUNITY)
Admission: EM | Admit: 2021-11-24 | Discharge: 2021-11-25 | Disposition: A | Discharge status: Home / Self Care | Payer: Medicare HMO | Attending: Emergency Medicine | Admitting: Emergency Medicine

## 2021-11-24 ENCOUNTER — Other Ambulatory Visit: Payer: Self-pay

## 2021-11-24 ENCOUNTER — Ambulatory Visit
Admission: EM | Admit: 2021-11-24 | Discharge: 2021-11-24 | Disposition: A | Discharge status: Home / Self Care | Payer: Medicare HMO | Attending: Urgent Care | Admitting: Urgent Care

## 2021-11-24 DIAGNOSIS — Z87891 Personal history of nicotine dependence: Secondary | ICD-10-CM | POA: Insufficient documentation

## 2021-11-24 DIAGNOSIS — R079 Chest pain, unspecified: Secondary | ICD-10-CM

## 2021-11-24 DIAGNOSIS — M79602 Pain in left arm: Secondary | ICD-10-CM | POA: Diagnosis not present

## 2021-11-24 DIAGNOSIS — Z853 Personal history of malignant neoplasm of breast: Secondary | ICD-10-CM | POA: Diagnosis not present

## 2021-11-24 DIAGNOSIS — R0789 Other chest pain: Secondary | ICD-10-CM | POA: Diagnosis not present

## 2021-11-24 DIAGNOSIS — M25512 Pain in left shoulder: Secondary | ICD-10-CM | POA: Diagnosis not present

## 2021-11-24 LAB — BASIC METABOLIC PANEL
Anion gap: 11 (ref 5–15)
BUN: 13 mg/dL (ref 8–23)
CO2: 23 mmol/L (ref 22–32)
Calcium: 9.6 mg/dL (ref 8.9–10.3)
Chloride: 105 mmol/L (ref 98–111)
Creatinine, Ser: 1.09 mg/dL — ABNORMAL HIGH (ref 0.44–1.00)
GFR, Estimated: 54 mL/min — ABNORMAL LOW (ref >60)
Glucose, Bld: 88 mg/dL (ref 70–99)
Potassium: 4.1 mmol/L (ref 3.5–5.1)
Sodium: 139 mmol/L (ref 135–145)

## 2021-11-24 LAB — CBC
HCT: 43.3 % (ref 36.0–46.0)
Hemoglobin: 13.3 g/dL (ref 12.0–15.0)
MCH: 26.8 pg (ref 26.0–34.0)
MCHC: 30.7 g/dL (ref 30.0–36.0)
MCV: 87.1 fL (ref 80.0–100.0)
Platelets: 171 10*3/uL (ref 150–400)
RBC: 4.97 MIL/uL (ref 3.87–5.11)
RDW: 13.6 % (ref 11.5–15.5)
WBC: 6.4 10*3/uL (ref 4.0–10.5)
nRBC: 0 % (ref 0.0–0.2)

## 2021-11-24 LAB — TROPONIN I (HIGH SENSITIVITY)
Troponin I (High Sensitivity): 3 ng/L (ref <18)
Troponin I (High Sensitivity): 4 ng/L (ref <18)

## 2021-11-24 NOTE — ED Provider Notes (Signed)
[place]   MRN: [id number] DOB: [birth year]  Subjective:   [name] [name] is a 71 y.o. female presenting for 3-day history of acute onset persistent left shoulder pain, symptoms have now involve the left side of her chest and left arm.  Patient reports that she feels soreness within her chest.  No fall, trauma, weakness, numbness or tingling, shortness of breath, diaphoresis, neck pain.  Patient does not have a history of hypertension or diabetes.  She is not a smoker.  No alcohol use.  No current facility-administered medications for this encounter.  Current Outpatient Medications:    ciclopirox (PENLAC) 8 % solution, Apply topically at bedtime. Apply over nail and surrounding skin. Apply daily over previous coat. After seven (7) days, may remove with alcohol and continue cycle., Disp: 6.6 mL, Rfl: 0   cyclobenzaprine (FLEXERIL) 10 MG tablet, Take 1 tablet (10 mg total) by mouth 2 (two) times daily as needed for muscle spasms., Disp: 20 tablet, Rfl: 0   terbinafine (LAMISIL) 250 MG tablet, Take 1 tablet (250 mg total) by mouth daily., Disp: 90 tablet, Rfl: 0   Allergies  Allergen Reactions   Peanut-Containing Drug Products Swelling   Codeine Nausea And Vomiting    Past Medical History:  Diagnosis Date   Arthritis    right knee   Cancer ([place])    breast   Full dentures    Personal history of radiation therapy    Radiation    Left Breast   Wears glasses      Past Surgical History:  Procedure Laterality Date   APPENDECTOMY     AXILLARY SENTINEL NODE BIOPSY Right 01/29/2014   Procedure: RIGHT AXILLARY SENTINEL LYMPHNODE BIOPSY;  Surgeon: Adin Hector, MD;  Location: [place];  Service: General;  Laterality: Right;   BREAST LUMPECTOMY Right 12/16/2013   Procedure: LUMPECTOMY;  Surgeon: Adin Hector, MD;  Location: [place];  Service: General;  Laterality: Right;   TUBAL LIGATION      Family History  Problem  Relation Age of Onset   Hypertension Sister    Heart failure Sister    Diabetes Sister    Diabetes Brother    Hypertension Brother    Diabetes Brother    Breast cancer Neg Hx     Social History   Tobacco Use   Smoking status: Former    Types: Cigarettes    Quit date: 12/10/1990    Years since quitting: 30.9   Smokeless tobacco: Never  Vaping Use   Vaping Use: Never used  Substance Use Topics   Alcohol use: No   Drug use: No    ROS   Objective:   Vitals: BP 112/72 (BP Location: Left Arm)    Pulse 90    Temp 98.2 F (36.8 C) (Oral)    Resp 18    SpO2 96%   Physical Exam Constitutional:      General: She is not in acute distress.    Appearance: Normal appearance. She is well-developed. She is not ill-appearing, toxic-appearing or diaphoretic.  HENT:     Head: Normocephalic and atraumatic.     Right Ear: External ear normal.     Left Ear: External ear normal.     Nose: Nose normal.     Mouth/Throat:     Mouth: Mucous membranes are moist.  Eyes:     General: No scleral icterus.       Right eye: No discharge.  Left eye: No discharge.     Extraocular Movements: Extraocular movements intact.  Cardiovascular:     Rate and Rhythm: Normal rate.     Heart sounds: No murmur heard.   No friction rub. No gallop.  Pulmonary:     Effort: Pulmonary effort is normal. No respiratory distress.     Breath sounds: No stridor. No wheezing, rhonchi or rales.  Chest:     Chest wall: No tenderness.  Skin:    General: Skin is warm and dry.  Neurological:     General: No focal deficit present.     Mental Status: She is alert and oriented to person, place, and time.  Psychiatric:        Mood and Affect: Mood normal.        Behavior: Behavior normal.        Thought Content: Thought content normal.        Judgment: Judgment normal.   ED ECG REPORT   Date: 11/24/2021  EKG Time: 12:13 PM  Rate: 77bpm  Rhythm: normal sinus rhythm,  unchanged from previous tracings  Axis:  Normal  Intervals:none  ST&T Change: Nonspecific T wave flattening in lead aVL.  Narrative Interpretation: Sinus rhythm at 77 bpm with nonspecific T wave changes above.  Possible LVH, very comparable to previous EKG.  No signs of STEMI.   Assessment and Plan :   PDMP not reviewed this encounter.  1. Left-sided chest pain   2. Left arm pain    Patient does not have any reproducible left-sided chest pain on exam and her report of feeling internal left-sided chest pain that radiates to the left shoulder and left arm is concerning for a cardiac event.  There is no sign of this on her EKG currently.  Declines transport by EMS.  States that she will go to the hospital now.  As she is hemodynamically stable and there is no active sign of a STEMI I feel that patient is appropriate to go by personal vehicle.  She will present to the hospital now.   Jaynee Eagles, PA-C 11/24/21 1235

## 2021-11-24 NOTE — ED Provider Triage Note (Signed)
Emergency Medicine Provider Triage Evaluation Note  [name] [name] , a 71 y.o. female  was evaluated in triage.  Pt complains of chest pain that started this morning.  She states over the last couple days she has been having some spasms in the left arm and left upper back.  Was seen urgent care and given muscle relaxers which are not helping.  No shortness of breath, nausea, vomiting, diarrhea, diaphoresis.  Review of Systems  Positive:  Negative: See above  Physical Exam  BP 137/88    Pulse 86    Temp 98.4 F (36.9 C) (Oral)    Resp 18    SpO2 100%  Gen:   Awake, no distress   Resp:  Normal effort  MSK:   Moves extremities without difficulty  Other:    Medical Decision Making  Medically screening exam initiated at 1:23 PM.  Appropriate orders placed.  Charise Leinbach was informed that the remainder of the evaluation will be completed by another provider, this initial triage assessment does not replace that evaluation, and the importance of remaining in the ED until their evaluation is complete.     Myna Bright [place], [place] 11/24/21 1324

## 2021-11-24 NOTE — ED Triage Notes (Signed)
Patient arrived POV; c/o left arm pain radiating to the chest started last night while she was laying down. Stated she's recently taking some medication for her back pain and spasms that seemed to move location.

## 2021-11-24 NOTE — Discharge Instructions (Signed)
Please report to the hospital now as you will need a higher level of evaluation and intervention than we can provide in the urgent care setting. The primary concern is to make sure your heart is okay. Please go straight there now.

## 2021-11-24 NOTE — ED Triage Notes (Signed)
Pt c/o muscle spasms into left shoulder states she was seen recently for spasms in back and they have since migrated to shoulder. States muscle relaxer given at last encounter were not helpful.

## 2021-11-25 MED ORDER — HYDROCODONE-ACETAMINOPHEN 5-325 MG PO TABS
ORAL_TABLET | ORAL | Status: AC
  Filled 2021-11-25: qty 2

## 2021-11-25 NOTE — ED Provider Notes (Signed)
[facility] Emergency Department Provider Note MRN:  [id number]  Arrival date & time: 11/25/21     Chief Complaint   Chest Pain   History of Present Illness   [name] [name] is a 71 y.o. year-old female with a pertinent past medical history presenting to the ED with chief complaint of chest pain.  Pain to the left arm and shoulder, a lot of pain when trying to move the arm.  Pain radiates into the left side of the chest.  Denies shortness of breath, no nausea or vomiting, no dizziness or diaphoresis.  Review of Systems  A thorough review of systems was obtained and all systems are negative except as noted in the HPI and PMH.   Patient's Health History    Past Medical History:  Diagnosis Date   Arthritis    right knee   Cancer ([place])    breast   Full dentures    Personal history of radiation therapy    Radiation    Left Breast   Wears glasses     Past Surgical History:  Procedure Laterality Date   APPENDECTOMY     AXILLARY SENTINEL NODE BIOPSY Right 01/29/2014   Procedure: RIGHT AXILLARY SENTINEL LYMPHNODE BIOPSY;  Surgeon: Adin Hector, MD;  Location: [place];  Service: General;  Laterality: Right;   BREAST LUMPECTOMY Right 12/16/2013   Procedure: LUMPECTOMY;  Surgeon: Adin Hector, MD;  Location: [place];  Service: General;  Laterality: Right;   TUBAL LIGATION      Family History  Problem Relation Age of Onset   Hypertension Sister    Heart failure Sister    Diabetes Sister    Diabetes Brother    Hypertension Brother    Diabetes Brother    Breast cancer Neg Hx     Social History   Socioeconomic History   Marital status: Widowed    Spouse name: Not on file   Number of children: 2   Years of education: Not on file   Highest education level: Not on file  Occupational History   Occupation: COOK    Employer: [employer]  Tobacco Use   Smoking status: Former    Types: Cigarettes    Quit  date: 12/10/1990    Years since quitting: 30.9   Smokeless tobacco: Never  Vaping Use   Vaping Use: Never used  Substance and Sexual Activity   Alcohol use: No   Drug use: No   Sexual activity: Not Currently  Other Topics Concern   Not on file  Social History Narrative   Not on file   Social Determinants of Health   Financial Resource Strain: Not on file  Food Insecurity: Not on file  Transportation Needs: Not on file  Physical Activity: Not on file  Stress: Not on file  Social Connections: Not on file  Intimate Partner Violence: Not on file     Physical Exam   Vitals:   11/24/21 2006 11/25/21 0117  BP: 122/84 (!) 144/85  Pulse: 88 97  Resp: 17 15  Temp:  99.7 F (37.6 C)  SpO2: 99% 97%    CONSTITUTIONAL: Well-appearing, NAD NEURO/PSYCH:  Alert and oriented x 3, no focal deficits EYES:  eyes equal and reactive ENT/NECK:  no LAD, no JVD CARDIO: Regular rate, well-perfused, normal S1 and S2 PULM:  CTAB no wheezing or rhonchi GI/GU:  non-distended, non-tender MSK/SPINE:  No gross deformities, no edema; pain elicited with any range of  motion of the left shoulder SKIN:  no rash, atraumatic   *Additional and/or pertinent findings included in MDM below  Diagnostic and Interventional Summary    EKG Interpretation  Date/Time:  Wednesday November 24 2021 13:17:01 EST Ventricular Rate:  82 PR Interval:  166 QRS Duration: 76 QT Interval:  348 QTC Calculation: 406 R Axis:   4 Text Interpretation: Normal sinus rhythm Minimal voltage criteria for LVH, may be normal variant ( R in aVL ) Nonspecific T wave abnormality Abnormal ECG When compared with ECG of 24-Nov-2021 11:42, PREVIOUS ECG IS PRESENT Confirmed by Gerlene Fee [phone]) on 11/25/2021 6:45:59 AM       Labs Reviewed  BASIC METABOLIC PANEL - Abnormal; Notable for the following components:      Result Value   Creatinine, Ser 1.09 (*)    GFR, Estimated 54 (*)    All other components within normal limits  CBC   TROPONIN I (HIGH SENSITIVITY)  TROPONIN I (HIGH SENSITIVITY)    DG Chest 2 View  Final Result      Medications  HYDROcodone-acetaminophen (NORCO/VICODIN) 5-325 MG per tablet (has no administration in time range)     Procedures  /  Critical Care Procedures  ED Course and Medical Decision Making  Initial Impression and Ddx Exam seems highly consistent with MSK related pain given the pain elicited with range of motion of the shoulder.  EKG is without ischemic changes, troponin is negative x2.  Patient does not have shortness of breath, no evidence of DVT, highly doubt PE.  Feeling better after pain control, no indication for further testing or admission, appropriate for discharge.  Past medical/surgical history that increases complexity of ED encounter: Breast cancer  Interpretation of Diagnostics I personally reviewed the EKG and my interpretation is as follows: Sinus rhythm without any significant change from prior    Troponin negative times  Patient Reassessment and Ultimate Disposition/Management Discharge home  Patient management required discussion with the following services or consulting groups:  None  Complexity of Problems Addressed Acute illness or injury that poses threat of life of bodily function  Additional Data Reviewed and Analyzed Further history obtained from: Recent PCP notes  Factors Impacting ED Encounter Risk Consideration of hospitalization  Barth Kirks. Sedonia Small, MD [place] [email or web address]  Final Clinical Impressions(s) / ED Diagnoses     ICD-10-CM   1. Chest pain, unspecified type  R07.9     2. Acute pain of left shoulder  M25.512       ED Discharge Orders     None        Discharge Instructions Discussed with and Provided to Patient:   Discharge Instructions   None      Maudie Flakes, MD 11/25/21 [phone]

## 2021-11-25 NOTE — ED Notes (Signed)
Pt roomed to RM 16 and discharged, see downtime charting

## 2021-12-03 DIAGNOSIS — Z170 Estrogen receptor positive status [ER+]: Secondary | ICD-10-CM | POA: Diagnosis not present

## 2021-12-03 DIAGNOSIS — K760 Fatty (change of) liver, not elsewhere classified: Secondary | ICD-10-CM | POA: Diagnosis not present

## 2021-12-03 DIAGNOSIS — Z9189 Other specified personal risk factors, not elsewhere classified: Secondary | ICD-10-CM | POA: Diagnosis not present

## 2021-12-03 DIAGNOSIS — C50411 Malignant neoplasm of upper-outer quadrant of right female breast: Secondary | ICD-10-CM | POA: Diagnosis not present

## 2022-01-04 ENCOUNTER — Other Ambulatory Visit: Payer: Self-pay

## 2022-01-04 ENCOUNTER — Ambulatory Visit: Payer: Medicare HMO | Admitting: Podiatry

## 2022-01-04 DIAGNOSIS — B351 Tinea unguium: Secondary | ICD-10-CM | POA: Diagnosis not present

## 2022-01-04 MED ORDER — CICLOPIROX 8 % EX SOLN: Freq: Every day | CUTANEOUS | 0 refills | Status: DC

## 2022-01-04 MED ORDER — TERBINAFINE HCL 250 MG PO TABS: 250.0000 mg | ORAL_TABLET | Freq: Every day | ORAL | 0 refills | Status: AC

## 2022-01-07 ENCOUNTER — Encounter: Payer: Self-pay | Admitting: Podiatry

## 2022-01-07 NOTE — Progress Notes (Signed)
?  Subjective:  ?Patient ID: [name] [name], female    DOB: 02/13/1951,  MRN: [id number] ? ?Chief Complaint  ?Patient presents with  ? Nail Problem  ?  Patient reports bilateral hallux has improved a great deal since last office visit. Patient reports using the penlac and lamisil as directed with good results. There is still a small amount of discolor and thickness to the nails but much better looking now.   ? ? ?71 y.o. female returns for follow-up with the above complaint. History confirmed with patient.  Since last visit she has seen significant improvement ? ?Objective:  ?Physical Exam: ?warm, good capillary refill, no trophic changes or ulcerative lesions, normal DP and PT pulses, and normal sensory exam.  Improvement with proximal clearing and lifting of the nail plate, approximately 90% improvement ? ? ? ? ? ? ? ? ? ? ?Assessment:  ? ?1. Onychomycosis   ? ? ? ? ? ?Plan:  ?Patient was evaluated and treated and all questions answered. ? ?Doing very well about 90% better from initiation of therapy.  I recommended 1 last course of Lamisil and a refill of this was sent to her pharmacy.  Hopefully this should be the last treatment she needs ? ?Return in about 4 months (around 05/06/2022) for follow up after nail fungus treatment.  ? ? ?

## 2022-01-10 ENCOUNTER — Inpatient Hospital Stay: Admission: RE | Admit: 2022-01-10 | Payer: Medicare HMO | Source: Ambulatory Visit

## 2022-01-11 ENCOUNTER — Inpatient Hospital Stay: Admission: RE | Admit: 2022-01-11 | Payer: Medicare HMO | Source: Ambulatory Visit

## 2022-03-15 ENCOUNTER — Ambulatory Visit (INDEPENDENT_AMBULATORY_CARE_PROVIDER_SITE_OTHER): Payer: Medicare HMO

## 2022-03-15 DIAGNOSIS — Z0000 Encounter for general adult medical examination without abnormal findings: Secondary | ICD-10-CM

## 2022-03-15 NOTE — Patient Instructions (Signed)
Ms. Safi , Thank you for taking time to come for your Medicare Wellness Visit. I appreciate your ongoing commitment to your health goals. Please review the following plan we discussed and let me know if I can assist you in the future.   Screening recommendations/referrals: Colonoscopy: 07/20/2015; due every 7 years Mammogram: 06/25/2021; due every year Bone Density: scheduled for 06/29/2022 Recommended yearly ophthalmology/optometry visit for glaucoma screening and checkup Recommended yearly dental visit for hygiene and checkup  Vaccinations: Influenza vaccine: 09/22/2021 Pneumococcal vaccine: 09/22/2021 Tdap vaccine: never done Shingles vaccine: 11/07/2019, 01/05/2020   Covid-19: 02/06/2020, 03/02/2020, 09/21/2020, 07/15/2021  Advanced directives: No  Conditions/risks identified: Yes  Next appointment: 03/17/2023 at 9:45 a.m. telephone visit with Mignon Pine, Nurse Health Advisor.  If you need to reschedule or cancel, please call [phone].   Preventive Care 1 Years and Older, Female Preventive care refers to lifestyle choices and visits with your health care provider that can promote health and wellness. What does preventive care include? A yearly physical exam. This is also called an annual well check. Dental exams once or twice a year. Routine eye exams. Ask your health care provider how often you should have your eyes checked. Personal lifestyle choices, including: Daily care of your teeth and gums. Regular physical activity. Eating a healthy diet. Avoiding tobacco and drug use. Limiting alcohol use. Practicing safe sex. Taking low-dose aspirin every day. Taking vitamin and mineral supplements as recommended by your health care provider. What happens during an annual well check? The services and screenings done by your health care provider during your annual well check will depend on your age, overall health, lifestyle risk factors, and family history of disease. Counseling  Your  health care provider may ask you questions about your: Alcohol use. Tobacco use. Drug use. Emotional well-being. Home and relationship well-being. Sexual activity. Eating habits. History of falls. Memory and ability to understand (cognition). Work and work Statistician. Reproductive health. Screening  You may have the following tests or measurements: Height, weight, and BMI. Blood pressure. Lipid and cholesterol levels. These may be checked every 5 years, or more frequently if you are over 26 years old. Skin check. Lung cancer screening. You may have this screening every year starting at age 58 if you have a 30-pack-year history of smoking and currently smoke or have quit within the past 15 years. Fecal occult blood test (FOBT) of the stool. You may have this test every year starting at age 80. Flexible sigmoidoscopy or colonoscopy. You may have a sigmoidoscopy every 5 years or a colonoscopy every 10 years starting at age 69. Hepatitis C blood test. Hepatitis B blood test. Sexually transmitted disease (STD) testing. Diabetes screening. This is done by checking your blood sugar (glucose) after you have not eaten for a while (fasting). You may have this done every 1-3 years. Bone density scan. This is done to screen for osteoporosis. You may have this done starting at age 22. Mammogram. This may be done every 1-2 years. Talk to your health care provider about how often you should have regular mammograms. Talk with your health care provider about your test results, treatment options, and if necessary, the need for more tests. Vaccines  Your health care provider may recommend certain vaccines, such as: Influenza vaccine. This is recommended every year. Tetanus, diphtheria, and acellular pertussis (Tdap, Td) vaccine. You may need a Td booster every 10 years. Zoster vaccine. You may need this after age 55. Pneumococcal 13-valent conjugate (PCV13) vaccine. One dose is recommended  after age  61. Pneumococcal polysaccharide (PPSV23) vaccine. One dose is recommended after age 62. Talk to your health care provider about which screenings and vaccines you need and how often you need them. This information is not intended to replace advice given to you by your health care provider. Make sure you discuss any questions you have with your health care provider. Document Released: 10/30/2015 Document Revised: 06/22/2016 Document Reviewed: 08/04/2015 Elsevier Interactive Patient Education  2017 [place] Prevention in the Home Falls can cause injuries. They can happen to people of all ages. There are many things you can do to make your home safe and to help prevent falls. What can I do on the outside of my home? Regularly fix the edges of walkways and driveways and fix any cracks. Remove anything that might make you trip as you walk through a door, such as a raised step or threshold. Trim any bushes or trees on the path to your home. Use bright outdoor lighting. Clear any walking paths of anything that might make someone trip, such as rocks or tools. Regularly check to see if handrails are loose or broken. Make sure that both sides of any steps have handrails. Any raised decks and porches should have guardrails on the edges. Have any leaves, snow, or ice cleared regularly. Use sand or salt on walking paths during winter. Clean up any spills in your garage right away. This includes oil or grease spills. What can I do in the bathroom? Use night lights. Install grab bars by the toilet and in the tub and shower. Do not use towel bars as grab bars. Use non-skid mats or decals in the tub or shower. If you need to sit down in the shower, use a plastic, non-slip stool. Keep the floor dry. Clean up any water that spills on the floor as soon as it happens. Remove soap buildup in the tub or shower regularly. Attach bath mats securely with double-sided non-slip rug tape. Do not have throw  rugs and other things on the floor that can make you trip. What can I do in the bedroom? Use night lights. Make sure that you have a light by your bed that is easy to reach. Do not use any sheets or blankets that are too big for your bed. They should not hang down onto the floor. Have a firm chair that has side arms. You can use this for support while you get dressed. Do not have throw rugs and other things on the floor that can make you trip. What can I do in the kitchen? Clean up any spills right away. Avoid walking on wet floors. Keep items that you use a lot in easy-to-reach places. If you need to reach something above you, use a strong step stool that has a grab bar. Keep electrical cords out of the way. Do not use floor polish or wax that makes floors slippery. If you must use wax, use non-skid floor wax. Do not have throw rugs and other things on the floor that can make you trip. What can I do with my stairs? Do not leave any items on the stairs. Make sure that there are handrails on both sides of the stairs and use them. Fix handrails that are broken or loose. Make sure that handrails are as long as the stairways. Check any carpeting to make sure that it is firmly attached to the stairs. Fix any carpet that is loose or worn. Avoid having throw  rugs at the top or bottom of the stairs. If you do have throw rugs, attach them to the floor with carpet tape. Make sure that you have a light switch at the top of the stairs and the bottom of the stairs. If you do not have them, ask someone to add them for you. What else can I do to help prevent falls? Wear shoes that: Do not have high heels. Have rubber bottoms. Are comfortable and fit you well. Are closed at the toe. Do not wear sandals. If you use a stepladder: Make sure that it is fully opened. Do not climb a closed stepladder. Make sure that both sides of the stepladder are locked into place. Ask someone to hold it for you, if  possible. Clearly mark and make sure that you can see: Any grab bars or handrails. First and last steps. Where the edge of each step is. Use tools that help you move around (mobility aids) if they are needed. These include: Canes. Walkers. Scooters. Crutches. Turn on the lights when you go into a dark area. Replace any light bulbs as soon as they burn out. Set up your furniture so you have a clear path. Avoid moving your furniture around. If any of your floors are uneven, fix them. If there are any pets around you, be aware of where they are. Review your medicines with your doctor. Some medicines can make you feel dizzy. This can increase your chance of falling. Ask your doctor what other things that you can do to help prevent falls. This information is not intended to replace advice given to you by your health care provider. Make sure you discuss any questions you have with your health care provider. Document Released: 07/30/2009 Document Revised: 03/10/2016 Document Reviewed: 11/07/2014 Elsevier Interactive Patient Education  2017 [employer].

## 2022-03-15 NOTE — Progress Notes (Signed)
I connected with [name] [name] today by telephone and verified that I am speaking with the correct person using two identifiers. Location patient: home Location provider: work Persons participating in the virtual visit: patient, provider.   I discussed the limitations, risks, security and privacy concerns of performing an evaluation and management service by telephone and the availability of in person appointments. I also discussed with the patient that there may be a patient responsible charge related to this service. The patient expressed understanding and verbally consented to this telephonic visit.    Interactive audio and video telecommunications were attempted between this provider and patient, however failed, due to patient having technical difficulties OR patient did not have access to video capability.  We continued and completed visit with audio only.  Some vital signs may be absent or patient reported.   Time Spent with patient on telephone encounter: 30 minutes  Subjective:   [name] [name] is a 71 y.o. female who presents for an Initial Medicare Annual Wellness Visit.  Review of Systems     Cardiac Risk Factors include: advanced age (>40mn, >>12women);family history of premature cardiovascular disease     Objective:    There were no vitals filed for this visit. There is no height or weight on file to calculate BMI.     03/15/2022    3:51 PM 11/24/2021    1:20 PM 07/22/2020   10:06 AM 09/08/2016   11:07 PM 09/07/2016   10:14 AM 09/08/2015    9:21 AM 02/06/2014   12:30 PM  Advanced Directives  Does Patient Have a Medical Advance Directive? No No No No No No Patient does not have advance directive;Patient would like information  Would patient like information on creating a medical advance directive? No - Patient declined No - Patient declined No - Patient declined        Current Medications (verified) Outpatient Encounter Medications as of 03/15/2022  Medication  Sig   ciclopirox (PENLAC) 8 % solution Apply topically at bedtime. Apply over nail and surrounding skin. Apply daily over previous coat. After seven (7) days, may remove with alcohol and continue cycle.   ciclopirox (PENLAC) 8 % solution Apply topically at bedtime. Apply over nail and surrounding skin. Apply daily over previous coat. After seven (7) days, may remove with alcohol and continue cycle.   terbinafine (LAMISIL) 250 MG tablet Take 1 tablet (250 mg total) by mouth daily.   cyclobenzaprine (FLEXERIL) 10 MG tablet Take 1 tablet (10 mg total) by mouth 2 (two) times daily as needed for muscle spasms. (Patient not taking: Reported on 03/15/2022)   No facility-administered encounter medications on file as of 03/15/2022.    Allergies (verified) Peanut-containing drug products and Codeine   History: Past Medical History:  Diagnosis Date   Arthritis    right knee   Cancer ([facility]    breast   Full dentures    Personal history of radiation therapy    Radiation    Left Breast   Wears glasses    Past Surgical History:  Procedure Laterality Date   APPENDECTOMY     AXILLARY SENTINEL NODE BIOPSY Right 01/29/2014   Procedure: RIGHT AXILLARY SENTINEL LYMPHNODE BIOPSY;  Surgeon: HAdin Hector MD;  [facility]  Service: General;  Laterality: Right;   BREAST LUMPECTOMY Right 12/16/2013   Procedure: LUMPECTOMY;  Surgeon: HAdin Hector MD;  [facility]  Service: General;  Laterality: Right;   TUBAL LIGATION  Family History  Problem Relation Age of Onset   Hypertension Sister    Heart failure Sister    Diabetes Sister    Diabetes Brother    Hypertension Brother    Diabetes Brother    Breast cancer Neg Hx    Social History   Socioeconomic History   Marital status: Widowed    Spouse name: Not on file   Number of children: 2   Years of education: Not on file   Highest education level: Not on file  Occupational History    Occupation: COOK    Employer: [employer]  Tobacco Use   Smoking status: Former    Types: Cigarettes    Quit date: 12/10/1990    Years since quitting: 31.2   Smokeless tobacco: Never  Vaping Use   Vaping Use: Never used  Substance and Sexual Activity   Alcohol use: No   Drug use: No   Sexual activity: Not Currently  Other Topics Concern   Not on file  Social History Narrative   Not on file   Social Determinants of Health   Financial Resource Strain: Low Risk    Difficulty of Paying Living Expenses: Not hard at all  Food Insecurity: No Food Insecurity   Worried About Charity fundraiser in the Last Year: Never true   [place] in the Last Year: Never true  Transportation Needs: No Transportation Needs   Lack of Transportation (Medical): No   Lack of Transportation (Non-Medical): No  Physical Activity: Sufficiently Active   Days of Exercise per Week: 5 days   Minutes of Exercise per Session: 30 min  Stress: No Stress Concern Present   Feeling of Stress : Not at all  Social Connections: Moderately Integrated   Frequency of Communication with Friends and Family: More than three times a week   Frequency of Social Gatherings with Friends and Family: More than three times a week   Attends Religious Services: More than 4 times per year   Active Member of [employer] or Organizations: Yes   Attends Archivist Meetings: More than 4 times per year   Marital Status: Widowed    Tobacco Counseling Counseling given: Not Answered   Clinical Intake:  Pre-visit preparation completed: Yes  Pain : No/denies pain     BMI - recorded: 31.39 Nutritional Status: BMI > 30  Obese Nutritional Risks: None Diabetes: No  How often do you need to have someone help you when you read instructions, pamphlets, or other written materials from your doctor or pharmacy?: 1 - Never What is the last grade level you completed in school?: HSG  Diabetic? no  Interpreter Needed?:  No  Information entered by :: Lisette Abu, LPN.   Activities of Daily Living    03/15/2022    3:56 PM 09/22/2021   10:09 AM  In your present state of health, do you have any difficulty performing the following activities:  Hearing? 0 0  Vision? 0 0  Difficulty concentrating or making decisions? 0 0  Walking or climbing stairs? 0 0  Dressing or bathing? 0 0  Doing errands, shopping? 0 0  Preparing Food and eating ? N   Using the Toilet? N   In the past six months, have you accidently leaked urine? Y   Do you have problems with loss of bowel control? N   Managing your Medications? N   Managing your Finances? N   Housekeeping or managing your Housekeeping? N  Patient Care Team: Janith Lima, MD as PCP - General (Internal Medicine) Juanita Craver, MD as Consulting Physician (Gastroenterology) Delice Bison, Charlestine Massed, NP as Nurse Practitioner (Hematology and Oncology) Verdell Carmine, MD as Referring Physician (Internal Medicine) Alanda Slim Neena Rhymes, MD as Consulting Physician (Ophthalmology)  Indicate any recent Medical Services you may have received from other than [facility] providers in the past year (date may be approximate).     Assessment:   This is a routine wellness examination for [name] [name].  Hearing/Vision screen Hearing Screening - Comments:: Patient denied any hearing difficulty.   No hearing aids.  Vision Screening - Comments:: Patient does wear corrective lenses/contacts.  Eye exam done by: Helmut Muster, MD.   Dietary issues and exercise activities discussed: Current Exercise Habits: Home exercise routine, Type of exercise: walking, Time (Minutes): 30, Frequency (Times/Week): 5, Weekly Exercise (Minutes/Week): 150, Intensity: Moderate, Exercise limited by: None identified   Goals Addressed             This Visit's Progress    My goal is to increase my physical activity.        Depression Screen    03/15/2022    3:56 PM 09/22/2021   10:12 AM  10/03/2017   10:58 AM  PHQ 2/9 Scores  PHQ - 2 Score 0 0 0    Fall Risk    03/15/2022    3:53 PM 09/22/2021   10:11 AM 10/03/2017   10:58 AM  [place] in the past year? 0 0 No  Number falls in past yr: 0    Injury with Fall? 0    Risk for fall due to : No Fall Risks    Follow up Falls evaluation completed      [place]:  Any stairs in or around the home? Yes  (stairlift) If so, are there any without handrails? No  Home free of loose throw rugs in walkways, pet beds, electrical cords, etc? Yes  Adequate lighting in your home to reduce risk of falls? Yes   ASSISTIVE DEVICES UTILIZED TO PREVENT FALLS:  Life alert? Yes  (ADT) Use of a cane, walker or w/c? No  Grab bars in the bathroom? No  Shower chair or bench in shower? Yes  Elevated toilet seat or a handicapped toilet? Yes   TIMED UP AND GO:  Was the test performed? No .  Length of time to ambulate 10 feet: n/a sec.   Appearance of gait: Patient not evaluated for gait during this visit.  Cognitive Function:        03/15/2022    4:01 PM  6CIT Screen  What Year? 0 points  What month? 0 points  What time? 0 points  Count back from 20 0 points  Months in reverse 0 points  Repeat phrase 0 points  Total Score 0 points    Immunizations Immunization History  Administered Date(s) Administered   Fluad Quad(high Dose 65+) 09/22/2021   PFIZER(Purple Top)SARS-COV-2 Vaccination 02/06/2020, 03/02/2020, 09/21/2020   PNEUMOCOCCAL CONJUGATE-20 09/22/2021   Pfizer Covid-19 Vaccine Bivalent Booster 31yr & up 07/15/2021   Zoster Recombinat (Shingrix) 11/07/2019, 01/05/2020    TDAP status: Due, Education has been provided regarding the importance of this vaccine. Advised may receive this vaccine at local pharmacy or Health Dept. Aware to provide a copy of the vaccination record if obtained from local pharmacy or Health Dept. Verbalized acceptance and understanding.  Flu Vaccine  status: Up to date  Pneumococcal vaccine  status: Up to date  Covid-19 vaccine status: Completed vaccines  Qualifies for Shingles Vaccine? Yes   Zostavax completed No   Shingrix Completed?: Yes  Screening Tests Health Maintenance  Topic Date Due   TETANUS/TDAP  Never done   DEXA SCAN  Never done   INFLUENZA VACCINE  05/17/2022   MAMMOGRAM  06/25/2022   COLONOSCOPY (Pts 45-41yr Insurance coverage will need to be confirmed)  07/19/2022   Pneumonia Vaccine 71 Years old  Completed   COVID-19 Vaccine  Completed   Hepatitis C Screening  Completed   Zoster Vaccines- Shingrix  Completed   HPV VACCINES  Aged Out    Health Maintenance  Health Maintenance Due  Topic Date Due   TETANUS/TDAP  Never done   DEXA SCAN  Never done    Colorectal cancer screening: Type of screening: Colonoscopy. Completed 07/20/2015. Repeat every 7 years (Dr. JJuanita Craver  Mammogram status: Completed 06/25/2021. Repeat every year  Bone Density status: scheduled for 06/29/2022  Lung Cancer Screening: (Low Dose CT Chest recommended if Age 73-80years, 30 pack-year currently smoking OR have quit w/in 15years.) does not qualify.   Lung Cancer Screening Referral: no  Additional Screening:  Hepatitis C Screening: does qualify; Completed 09/22/2021  Vision Screening: Recommended annual ophthalmology exams for early detection of glaucoma and other disorders of the eye. Is the patient up to date with their annual eye exam?  Yes  Who is the provider or what is the name of the office in which the patient attends annual eye exams? AHelmut Muster MD. If pt is not established with a provider, would they like to be referred to a provider to establish care? No .   Dental Screening: Recommended annual dental exams for proper oral hygiene  Community Resource Referral / Chronic Care Management: CRR required this visit?  No   CCM required this visit?  No      Plan:     I have personally reviewed and noted the  following in the patient's chart:   Medical and social history Use of alcohol, tobacco or illicit drugs  Current medications and supplements including opioid prescriptions. Patient is not currently taking opioid prescriptions. Functional ability and status Nutritional status Physical activity Advanced directives List of other physicians Hospitalizations, surgeries, and ER visits in previous 12 months Vitals Screenings to include cognitive, depression, and falls Referrals and appointments  In addition, I have reviewed and discussed with patient certain preventive protocols, quality metrics, and best practice recommendations. A written personalized care plan for preventive services as well as general preventive health recommendations were provided to patient.     SSheral Flow LPN   [id number]  Nurse Notes:  There were no vitals filed for this visit. There is no height or weight on file to calculate BMI. Patient stated that she has no issues with gait or balance; does not use any assistive devices. Medications reviewed with patient; no opioid use noted.

## 2022-04-07 ENCOUNTER — Ambulatory Visit
Admission: RE | Admit: 2022-04-07 | Discharge: 2022-04-07 | Disposition: A | Discharge status: Home / Self Care | Payer: Medicare HMO | Source: Ambulatory Visit | Attending: Internal Medicine | Admitting: Internal Medicine

## 2022-04-07 VITALS — BP 107/64 | HR 63 | Temp 97.9°F | Resp 18

## 2022-04-07 DIAGNOSIS — L2389 Allergic contact dermatitis due to other agents: Secondary | ICD-10-CM | POA: Diagnosis not present

## 2022-04-07 MED ORDER — PREDNISONE 20 MG PO TABS
20.0000 mg | ORAL_TABLET | Freq: Every day | ORAL | 0 refills | Status: AC
Start: 2022-04-07 — End: 2022-04-12

## 2022-04-07 NOTE — ED Triage Notes (Signed)
Pt present posion ivy on both arms. Pt noticed the rash yesterday. The rash is itching and burning.

## 2022-04-07 NOTE — ED Provider Notes (Signed)
[name]    CSN: [id number] Arrival date & time: 04/07/22  1008      History   Chief Complaint Chief Complaint  Patient presents with   Poison Ivy    HPI [name] [name] is a 71 y.o. female.   Patient presents with rash to bilateral arms that she is attributing to poison ivy after working in her garden yesterday.  Rash is itchy.  Denies any purulent drainage from rash.  Denies fever, bodies, chills.  She has not taken any medications to alleviate symptoms. Denies shortness of breath of feelings of throat closing.    Poison Ivy    Past Medical History:  Diagnosis Date   Arthritis    right knee   Cancer ([place])    breast   Full dentures    Personal history of radiation therapy    Radiation    Left Breast   Wears glasses     Patient Active Problem List   Diagnosis Date Noted   Hyperlipidemia with target LDL less than 130 09/22/2021   Encounter for general adult medical examination with abnormal findings 09/22/2021   Prediabetes 09/22/2021   Need for hepatitis C screening test 09/22/2021   Stage 3a chronic kidney disease ([place]) 09/22/2021   Need for prophylactic vaccination with combined diphtheria-tetanus-pertussis (DTP) vaccine 09/22/2021   Flu vaccine need 09/22/2021   Need for vaccination 09/22/2021   Malignant neoplasm of lower-inner quadrant of right breast of female, estrogen receptor positive ([place]) 12/31/2013    Past Surgical History:  Procedure Laterality Date   APPENDECTOMY     AXILLARY SENTINEL NODE BIOPSY Right 01/29/2014   Procedure: RIGHT AXILLARY SENTINEL LYMPHNODE BIOPSY;  Surgeon: Adin Hector, MD;  Location: [facility];  Service: General;  Laterality: Right;   BREAST LUMPECTOMY Right 12/16/2013   Procedure: LUMPECTOMY;  Surgeon: Adin Hector, MD;  Location: [facility];  Service: General;  Laterality: Right;   TUBAL LIGATION      OB History   No obstetric history on file.      Home  Medications    Prior to Admission medications   Medication Sig Start Date End Date Taking? Authorizing Provider  predniSONE (DELTASONE) 20 MG tablet Take 1 tablet (20 mg total) by mouth daily for 5 days. 04/07/22 04/12/22 Yes Omaria Plunk, Michele Rockers, FNP  ciclopirox (PENLAC) 8 % solution Apply topically at bedtime. Apply over nail and surrounding skin. Apply daily over previous coat. After seven (7) days, may remove with alcohol and continue cycle. 09/06/21   McDonald, Stephan Minister, DPM  ciclopirox (PENLAC) 8 % solution Apply topically at bedtime. Apply over nail and surrounding skin. Apply daily over previous coat. After seven (7) days, may remove with alcohol and continue cycle. 01/04/22   McDonald, Stephan Minister, DPM  cyclobenzaprine (FLEXERIL) 10 MG tablet Take 1 tablet (10 mg total) by mouth 2 (two) times daily as needed for muscle spasms. Patient not taking: Reported on 03/15/2022 11/21/21   Francene Finders, PA-C    Family History Family History  Problem Relation Age of Onset   Hypertension Sister    Heart failure Sister    Diabetes Sister    Diabetes Brother    Hypertension Brother    Diabetes Brother    Breast cancer Neg Hx     Social History Social History   Tobacco Use   Smoking status: Former    Types: Cigarettes    Quit date: 12/10/1990    Years since  quitting: 31.3   Smokeless tobacco: Never  Vaping Use   Vaping Use: Never used  Substance Use Topics   Alcohol use: No   Drug use: No     Allergies   Peanut-containing drug products and Codeine   Review of Systems Review of Systems Per HPI  Physical Exam Triage Vital Signs ED Triage Vitals [04/07/22 1027]  Enc Vitals Group     BP 107/64     Pulse Rate 63     Resp 18     Temp 97.9 F (36.6 C)     Temp Source Oral     SpO2 98 %     Weight      Height      Head Circumference      Peak Flow      Pain Score 0     Pain Loc      Pain Edu?      Excl. in [place]?    No data found.  Updated Vital Signs BP 107/64 (BP Location:  Left Arm)   Pulse 63   Temp 97.9 F (36.6 C) (Oral)   Resp 18   SpO2 98%   Visual Acuity Right Eye Distance:   Left Eye Distance:   Bilateral Distance:    Right Eye Near:   Left Eye Near:    Bilateral Near:     Physical Exam Constitutional:      General: She is not in acute distress.    Appearance: Normal appearance. She is not toxic-appearing or diaphoretic.  HENT:     Head: Normocephalic and atraumatic.  Eyes:     Extraocular Movements: Extraocular movements intact.     Conjunctiva/sclera: Conjunctivae normal.  Pulmonary:     Effort: Pulmonary effort is normal.  Skin:    Comments: vesicular, macular papular rash present to bilateral forearms and wrists.  No purulent drainage noted.  Neurological:     General: No focal deficit present.     Mental Status: She is alert and oriented to person, place, and time. Mental status is at baseline.  Psychiatric:        Mood and Affect: Mood normal.        Behavior: Behavior normal.        Thought Content: Thought content normal.        Judgment: Judgment normal.      UC Treatments / Results  Labs (all labs ordered are listed, but only abnormal results are displayed) Labs Reviewed - No data to display  EKG   Radiology No results found.  Procedures Procedures (including critical care time)  Medications Ordered in UC Medications - No data to display  Initial Impression / Assessment and Plan / UC Course  I have reviewed the triage vital signs and the nursing notes.  Pertinent labs & imaging results that were available during my care of the patient were reviewed by me and considered in my medical decision making (see chart for details).     Rash is consistent with allergic contact dermatitis.  Will treat with low-dose and short course of prednisone.  There does not appear to be any obvious contraindication to prednisone.  Discussed supportive care with patient.  Discussed return precautions.  Patient verbalized  understanding and was agreeable with plan. Final Clinical Impressions(s) / UC Diagnoses   Final diagnoses:  Allergic contact dermatitis due to other agents     Discharge Instructions      It appears that you are having an allergic reaction to  poison ivy.  You have been prescribed prednisone steroid that will help decrease this allergic reaction, itching, inflammation.  Please follow-up if symptoms persist or worsen.    ED Prescriptions     Medication Sig Dispense Auth. Provider   predniSONE (DELTASONE) 20 MG tablet Take 1 tablet (20 mg total) by mouth daily for 5 days. 5 tablet [place], Michele Rockers, [place]      PDMP not reviewed this encounter.   Teodora Medici, [place] 04/07/22 1057

## 2022-04-07 NOTE — Discharge Instructions (Signed)
It appears that you are having an allergic reaction to poison ivy.  You have been prescribed prednisone steroid that will help decrease this allergic reaction, itching, inflammation.  Please follow-up if symptoms persist or worsen.

## 2022-04-13 ENCOUNTER — Encounter: Payer: Self-pay | Admitting: Internal Medicine

## 2022-04-13 ENCOUNTER — Ambulatory Visit (INDEPENDENT_AMBULATORY_CARE_PROVIDER_SITE_OTHER): Payer: Medicare HMO | Admitting: Internal Medicine

## 2022-04-13 VITALS — BP 128/76 | Temp 98.1°F | Ht 62.25 in | Wt 173.0 lb

## 2022-04-13 DIAGNOSIS — L237 Allergic contact dermatitis due to plants, except food: Secondary | ICD-10-CM | POA: Diagnosis not present

## 2022-04-13 DIAGNOSIS — R7303 Prediabetes: Secondary | ICD-10-CM | POA: Diagnosis not present

## 2022-04-13 DIAGNOSIS — N1831 Chronic kidney disease, stage 3a: Secondary | ICD-10-CM | POA: Diagnosis not present

## 2022-04-13 LAB — BASIC METABOLIC PANEL
BUN: 21 mg/dL (ref 6–23)
CO2: 28 mEq/L (ref 19–32)
Calcium: 9.3 mg/dL (ref 8.4–10.5)
Chloride: 107 mEq/L (ref 96–112)
Creatinine, Ser: 1.07 mg/dL (ref 0.40–1.20)
GFR: 51.84 mL/min — ABNORMAL LOW (ref >60.00)
Glucose, Bld: 104 mg/dL — ABNORMAL HIGH (ref 70–99)
Potassium: 3.9 mEq/L (ref 3.5–5.1)
Sodium: 141 mEq/L (ref 135–145)

## 2022-04-13 LAB — URINALYSIS, ROUTINE W REFLEX MICROSCOPIC
Bilirubin Urine: NEGATIVE
Ketones, ur: NEGATIVE
Nitrite: NEGATIVE
Specific Gravity, Urine: >=1.03 — AB (ref 1.000–1.030)
Total Protein, Urine: NEGATIVE
Urine Glucose: NEGATIVE
Urobilinogen, UA: 0.2 (ref 0.0–1.0)
pH: 6 (ref 5.0–8.0)

## 2022-04-13 MED ORDER — CLOBETASOL PROPIONATE 0.05 % EX OINT: 1.0000 | TOPICAL_OINTMENT | Freq: Two times a day (BID) | CUTANEOUS | 0 refills | Status: DC

## 2022-04-13 MED ORDER — HYDROXYZINE HCL 10 MG PO TABS: 10.0000 mg | ORAL_TABLET | Freq: Three times a day (TID) | ORAL | 0 refills | Status: DC | PRN

## 2022-04-13 NOTE — Patient Instructions (Signed)
Poison Ivy Dermatitis Poison ivy dermatitis is inflammation of the skin that is caused by chemicals in the leaves of the poison ivy plant. The skin reaction often involves redness, swelling, blisters, and extreme itching. What are the causes? This condition is caused by a chemical (urushiol) found in the sap of the poison ivy plant. This chemical is sticky and can be easily spread to people, animals, and objects. You can get poison ivy dermatitis by: Having direct contact with a poison ivy plant. Touching animals, other people, or objects that have come in contact with poison ivy and have the chemical on them. What increases the risk? This condition is more likely to develop in people who: Are outdoors often in wooded or [place] areas. Go outdoors without wearing protective clothing, such as closed shoes, long pants, and a long-sleeved shirt. What are the signs or symptoms? Symptoms of this condition include: Redness of the skin. Extreme itching. A rash that often includes bumps and blisters. The rash usually appears 48 hours after exposure, if you have been exposed before. If this is the first time you have been exposed, the rash may not appear until a week after exposure. Swelling. This may occur if the reaction is more severe. Symptoms usually last for 1-2 weeks. However, the first time you develop this condition, symptoms may last 3-4 weeks. How is this diagnosed? This condition may be diagnosed based on your symptoms and a physical exam. Your health care provider may also ask you about any recent outdoor activity. How is this treated? Treatment for this condition will vary depending on how severe it is. Treatment may include: Hydrocortisone cream or calamine lotion to relieve itching. Oatmeal baths to soothe the skin. Medicines, such as over-the-counter antihistamine tablets. Oral steroid medicine, for more severe reactions. Follow these instructions at home: Medicines Take or apply  over-the-counter and prescription medicines only as told by your health care provider. Use hydrocortisone cream or calamine lotion as needed to soothe the skin and relieve itching. General instructions Do not scratch or rub your skin. Apply a cold, wet cloth (cold compress) to the affected areas or take baths in cool water. This will help with itching. Avoid hot baths and showers. Take oatmeal baths as needed. Use colloidal oatmeal. You can get this at your local pharmacy or grocery store. Follow the instructions on the packaging. While you have the rash, wash clothes right after you wear them. Keep all follow-up visits as told by your health care provider. This is important. How is this prevented?  Learn to identify the poison ivy plant and avoid contact with the plant. This plant can be recognized by the number of leaves. Generally, poison ivy has three leaves with flowering branches on a single stem. The leaves are typically glossy, and they have jagged edges that come to a point at the front. If you have been exposed to poison ivy, thoroughly wash with soap and water right away. You have about 30 minutes to remove the plant resin before it will cause the rash. Be sure to wash under your fingernails, because any plant resin there will continue to spread the rash. When hiking or camping, wear clothes that will help you to avoid exposure on the skin. This includes long pants, a long-sleeved shirt, tall socks, and hiking boots. You can also apply preventive lotion to your skin to help limit exposure. If you suspect that your clothes or outdoor gear came in contact with poison ivy, rinse them off outside  with a garden hose before you bring them inside your house. When doing yard work or gardening, wear gloves, long sleeves, long pants, and boots. Wash your garden tools and gloves if they come in contact with poison ivy. If you suspect that your pet has come into contact with poison ivy, wash him or her  with pet shampoo and water. Make sure to wear gloves while washing your pet. Contact a health care provider if you have: Open sores in the rash area. More redness, swelling, or pain in the affected area. Redness that spreads beyond the rash area. Fluid, blood, or pus coming from the affected area. A fever. A rash over a large area of your body. A rash on your eyes, mouth, or genitals. A rash that does not improve after a few weeks. Get help right away if: Your face swells or your eyes swell shut. You have trouble breathing. You have trouble swallowing. These symptoms may represent a serious problem that is an emergency. Do not wait to see if the symptoms will go away. Get medical help right away. Call your local emergency services (911 in the [place] Do not drive yourself to the hospital. Summary Poison ivy dermatitis is inflammation of the skin that is caused by chemicals in the leaves of the poison ivy plant. Symptoms of this condition include redness, itching, a rash, and swelling. Do not scratch or rub your skin. Take or apply over-the-counter and prescription medicines only as told by your health care provider. This information is not intended to replace advice given to you by your health care provider. Make sure you discuss any questions you have with your health care provider. Document Revised: 07/19/2021 Document Reviewed: 07/19/2021 Elsevier Patient Education  2023 Elsevier Inc.  

## 2022-04-13 NOTE — Progress Notes (Signed)
Subjective:  Patient ID: [name] [name], female    DOB: [birth year]  Age: 71 y.o. MRN: [id number]  CC: Rash   HPI [name] [name] presents for f/up -  She complains of a 10-day history of itchy rash on both forearms.  She was seen elsewhere and prescribed a Medrol Dosepak but continues to complain of an itchy rash.  She is not taking any antihistamines or using any topical agents.  Outpatient Medications Prior to Visit  Medication Sig Dispense Refill   ciclopirox (PENLAC) 8 % solution Apply topically at bedtime. Apply over nail and surrounding skin. Apply daily over previous coat. After seven (7) days, may remove with alcohol and continue cycle. 6.6 mL 0   ciclopirox (PENLAC) 8 % solution Apply topically at bedtime. Apply over nail and surrounding skin. Apply daily over previous coat. After seven (7) days, may remove with alcohol and continue cycle. 6.6 mL 0   cyclobenzaprine (FLEXERIL) 10 MG tablet Take 1 tablet (10 mg total) by mouth 2 (two) times daily as needed for muscle spasms. (Patient not taking: Reported on 03/15/2022) 20 tablet 0   No facility-administered medications prior to visit.    ROS Review of Systems  Constitutional: Negative.  Negative for diaphoresis and fatigue.  HENT: Negative.    Eyes: Negative.   Respiratory:  Negative for cough, chest tightness, shortness of breath and wheezing.   Cardiovascular:  Negative for chest pain, palpitations and leg swelling.  Gastrointestinal:  Negative for abdominal pain and diarrhea.  Endocrine: Negative.   Genitourinary: Negative.  Negative for difficulty urinating.  Musculoskeletal: Negative.   Skin:  Positive for rash.  Neurological:  Negative for dizziness and weakness.  Hematological:  Negative for adenopathy. Does not bruise/bleed easily.  Psychiatric/Behavioral: Negative.      Objective:  BP 128/76 (BP Location: Right Arm, Patient Position: Sitting, Cuff Size: Large)   Temp 98.1 F (36.7 C) (Oral)   Ht  5' 2.25" (1.581 m)   Wt 173 lb (78.5 kg)   BMI 31.39 kg/m   BP Readings from Last 3 Encounters:  04/13/22 128/76  04/07/22 107/64  11/25/21 123/70    Wt Readings from Last 3 Encounters:  04/13/22 173 lb (78.5 kg)  09/22/21 173 lb (78.5 kg)  07/22/21 175 lb (79.4 kg)    Physical Exam Vitals reviewed.  HENT:     Mouth/Throat:     Mouth: Mucous membranes are moist.  Eyes:     General: No scleral icterus.    Conjunctiva/sclera: Conjunctivae normal.  Cardiovascular:     Rate and Rhythm: Normal rate and regular rhythm.     Heart sounds: No murmur heard. Pulmonary:     Effort: Pulmonary effort is normal.     Breath sounds: No stridor. No wheezing, rhonchi or rales.  Abdominal:     Palpations: There is no mass.     Tenderness: There is no abdominal tenderness. There is no guarding.     Hernia: No hernia is present.  Musculoskeletal:        General: Normal range of motion.     Cervical back: Neck supple.     Right lower leg: No edema.     Left lower leg: No edema.  Lymphadenopathy:     Cervical: No cervical adenopathy.  Skin:    General: Skin is warm.     Findings: Rash present. Rash is papular. Rash is not macular, nodular, purpuric, pustular or scaling.  Neurological:     General: No focal  deficit present.     Mental Status: Mental status is at baseline.  Psychiatric:        Mood and Affect: Mood normal.     Lab Results  Component Value Date   WBC 6.4 11/24/2021   HGB 13.3 11/24/2021   HCT 43.3 11/24/2021   PLT 171 11/24/2021   GLUCOSE 104 (H) 04/13/2022   CHOL 205 (H) 09/22/2021   TRIG 159.0 (H) 09/22/2021   HDL 43.30 09/22/2021   LDLCALC 130 (H) 09/22/2021   ALT 15 09/22/2021   AST 19 09/22/2021   NA 141 04/13/2022   K 3.9 04/13/2022   CL 107 04/13/2022   CREATININE 1.07 04/13/2022   BUN 21 04/13/2022   CO2 28 04/13/2022   TSH 3.32 09/22/2021   HGBA1C 6.2 09/22/2021    No results found.  Assessment & Plan:   [name] [name] was seen today for  rash.  Diagnoses and all orders for this visit:  Stage 3a chronic kidney disease ([name] [name])- Her renal function is stable. -     Basic metabolic panel; Future -     Urinalysis, Routine w reflex microscopic; Future -     Urinalysis, Routine w reflex microscopic -     Basic metabolic panel  Prediabetes- Her A1c is at 6.2%. -     Basic metabolic panel; Future -     Basic metabolic panel  Allergic contact dermatitis due to plants, except food -     clobetasol ointment (TEMOVATE) 0.05 %; Apply 1 Application topically 2 (two) times daily. -     hydrOXYzine (ATARAX) 10 MG tablet; Take 1 tablet (10 mg total) by mouth 3 (three) times daily as needed.   I have discontinued [name] [name]. [name] [name]'s cyclobenzaprine. I am also having her start on clobetasol ointment and hydrOXYzine. Additionally, I am having her maintain her ciclopirox.  Meds ordered this encounter  Medications   clobetasol ointment (TEMOVATE) 0.05 %    Sig: Apply 1 Application topically 2 (two) times daily.    Dispense:  45 g    Refill:  0   hydrOXYzine (ATARAX) 10 MG tablet    Sig: Take 1 tablet (10 mg total) by mouth 3 (three) times daily as needed.    Dispense:  30 tablet    Refill:  0     Follow-up: Return in about 6 months (around 10/13/2022).  Scarlette Calico, MD

## 2022-05-03 ENCOUNTER — Ambulatory Visit: Payer: Medicare HMO | Admitting: Podiatry

## 2022-05-03 ENCOUNTER — Encounter: Payer: Self-pay | Admitting: Podiatry

## 2022-05-03 DIAGNOSIS — B351 Tinea unguium: Secondary | ICD-10-CM | POA: Diagnosis not present

## 2022-05-03 NOTE — Progress Notes (Signed)
  Subjective:  Patient ID: Eda Keys, female    DOB: April 27, 1951,  MRN: [id number]  Chief Complaint  Patient presents with   Nail Problem     4 months  follow up after nail fungus treatment    71 y.o. female returns for follow-up with the above complaint. History confirmed with patient.  Overall doing well and pleased with her toenail progress  Objective:  Physical Exam: warm, good capillary refill, no trophic changes or ulcerative lesions, normal DP and PT pulses, and normal sensory exam.  Near complete resolution of onychomycosis           Assessment:   1. Onychomycosis        Plan:  Patient was evaluated and treated and all questions answered.  Doing much better its nearly fully resolved she may continue the ciclopirox until it resolves completely  This or other issues develop  Return if symptoms worsen or fail to improve.

## 2022-05-30 ENCOUNTER — Other Ambulatory Visit: Payer: Self-pay | Admitting: Internal Medicine

## 2022-05-30 DIAGNOSIS — Z1231 Encounter for screening mammogram for malignant neoplasm of breast: Secondary | ICD-10-CM

## 2022-06-28 ENCOUNTER — Other Ambulatory Visit: Payer: Medicare HMO

## 2022-06-28 ENCOUNTER — Ambulatory Visit
Admission: RE | Admit: 2022-06-28 | Discharge: 2022-06-28 | Disposition: A | Discharge status: Home / Self Care | Payer: Medicare HMO | Source: Ambulatory Visit | Attending: Internal Medicine | Admitting: Internal Medicine

## 2022-06-28 DIAGNOSIS — Z1231 Encounter for screening mammogram for malignant neoplasm of breast: Secondary | ICD-10-CM

## 2022-06-29 ENCOUNTER — Ambulatory Visit
Admission: RE | Admit: 2022-06-29 | Discharge: 2022-06-29 | Disposition: A | Discharge status: Home / Self Care | Payer: Medicare HMO | Source: Ambulatory Visit | Attending: Adult Health | Admitting: Adult Health

## 2022-06-29 ENCOUNTER — Telehealth: Payer: Self-pay

## 2022-06-29 DIAGNOSIS — E2839 Other primary ovarian failure: Secondary | ICD-10-CM

## 2022-06-29 DIAGNOSIS — Z780 Asymptomatic menopausal state: Secondary | ICD-10-CM | POA: Diagnosis not present

## 2022-06-29 NOTE — Telephone Encounter (Signed)
Pt is aware of results and verbalized thanks and understanding.

## 2022-06-29 NOTE — Telephone Encounter (Signed)
-----   Message from Gardenia Phlegm, NP sent at 06/29/2022 12:55 PM EDT ----- Normal bone density, please notify patient ----- Message ----- From: Interface, Rad Results In Sent: 06/29/2022  12:26 PM EDT To: Gardenia Phlegm, NP

## 2022-07-06 ENCOUNTER — Telehealth: Payer: Self-pay | Admitting: Adult Health

## 2022-07-06 NOTE — Telephone Encounter (Signed)
Rescheduled appointment per provider PAL. Patient is aware of the changes made to her upcoming appointment. 

## 2022-07-22 ENCOUNTER — Encounter: Payer: Medicare HMO | Admitting: Adult Health

## 2022-07-28 ENCOUNTER — Encounter: Payer: Self-pay | Admitting: Adult Health

## 2022-07-28 ENCOUNTER — Inpatient Hospital Stay: Payer: Medicare HMO | Attending: Adult Health | Admitting: Adult Health

## 2022-07-28 VITALS — BP 148/76 | HR 67 | Temp 98.1°F | Resp 18 | Ht 62.0 in | Wt 167.0 lb

## 2022-07-28 DIAGNOSIS — Z803 Family history of malignant neoplasm of breast: Secondary | ICD-10-CM | POA: Insufficient documentation

## 2022-07-28 DIAGNOSIS — Z87891 Personal history of nicotine dependence: Secondary | ICD-10-CM | POA: Diagnosis not present

## 2022-07-28 DIAGNOSIS — C50311 Malignant neoplasm of lower-inner quadrant of right female breast: Secondary | ICD-10-CM | POA: Diagnosis not present

## 2022-07-28 DIAGNOSIS — N1831 Chronic kidney disease, stage 3a: Secondary | ICD-10-CM | POA: Diagnosis not present

## 2022-07-28 DIAGNOSIS — Z923 Personal history of irradiation: Secondary | ICD-10-CM | POA: Diagnosis not present

## 2022-07-28 DIAGNOSIS — Z170 Estrogen receptor positive status [ER+]: Secondary | ICD-10-CM

## 2022-07-28 DIAGNOSIS — Z853 Personal history of malignant neoplasm of breast: Secondary | ICD-10-CM | POA: Insufficient documentation

## 2022-07-28 NOTE — Progress Notes (Signed)
[facility] Follow up:    [name] Lima, MD [place] [address]   DIAGNOSIS:  Cancer Staging  Malignant neoplasm of lower-inner quadrant of right breast of female, estrogen receptor positive ([place]) Staging form: Breast, AJCC 7th Edition - Clinical: No stage assigned - Unsigned Histopathologic type: Adenoid cystic carcinoma Laterality: Right Tumor size (mm): 52 - Pathologic: Stage IIB (T3, N0, cM0) - Signed by Thea Silversmith, MD on 02/06/2014 Histopathologic type: Adenoid cystic carcinoma Laterality: Right Tumor size (mm): 52   SUMMARY OF ONCOLOGIC HISTORY: [place] woman Status post right upper outer quadrant breast biopsy to 12/06/2013 for an atypical papillary lesion.   (1) right lumpectomy March 2015 showed an intracystic papillary carcinoma measuring 5.2 cm, grade 1, 100% estrogen and 100% progesterone receptor positive, with an MIB-1 of 8% and no HER-2 amplification.   (2) status post right axillary lymph node sampling 01/29/2014, both sentinel lymph nodes being clear.   (3) Oncotype score of 0 predicts a 10 years risk of outside the breast recurrence of 3% if the patient's only systemic therapy is tamoxifen for 5 years   (4) adjuvant radiation 03/17/2014-04/17/2014:   Right breast/ 50 Gy in 25 fractions at 2 Gy per fraction (last 2 treatments given BID)    (5) tamoxifen 05/17/2014-08/2019  CURRENT THERAPY: observation  INTERVAL HISTORY: [name] [name] 71 y.o. female returns for follow-up of her history of breast cancer.  Her most recent mammogram was completed on June 29, 2022 and showed no mammographic evidence of malignancy and breast density category B.  She underwent bone density testing that was completed on June 29, 2022 as well which was normal.   Patient Active Problem List   Diagnosis Date Noted   Allergic contact dermatitis due to plants, except food 04/13/2022   Hyperlipidemia with target LDL less  than 130 09/22/2021   Encounter for general adult medical examination with abnormal findings 09/22/2021   Prediabetes 09/22/2021   Stage 3a chronic kidney disease ([place]) 09/22/2021   Need for prophylactic vaccination with combined diphtheria-tetanus-pertussis (DTP) vaccine 09/22/2021   Flu vaccine need 09/22/2021   Need for vaccination 09/22/2021   Malignant neoplasm of lower-inner quadrant of right breast of female, estrogen receptor positive ([place]) 12/31/2013    is allergic to peanut-containing drug products and codeine.  MEDICAL HISTORY: Past Medical History:  Diagnosis Date   Arthritis    right knee   Cancer ([place])    breast   Full dentures    Personal history of radiation therapy    Radiation    Left Breast   Wears glasses     SURGICAL HISTORY: Past Surgical History:  Procedure Laterality Date   APPENDECTOMY     AXILLARY SENTINEL NODE BIOPSY Right 01/29/2014   Procedure: RIGHT AXILLARY SENTINEL LYMPHNODE BIOPSY;  Surgeon: Adin Hector, MD;  Location: [facility];  Service: General;  Laterality: Right;   BREAST LUMPECTOMY Right 12/16/2013   Procedure: LUMPECTOMY;  Surgeon: Adin Hector, MD;  Location: [facility];  Service: General;  Laterality: Right;   TUBAL LIGATION      SOCIAL HISTORY: Social History   Socioeconomic History   Marital status: Widowed    Spouse name: Not on file   Number of children: 2   Years of education: Not on file   Highest education level: Not on file  Occupational History   Occupation: COOK    Employer: [employer]  Tobacco Use   Smoking  status: Former    Types: Cigarettes    Quit date: 12/10/1990    Years since quitting: 31.6   Smokeless tobacco: Never  Vaping Use   Vaping Use: Never used  Substance and Sexual Activity   Alcohol use: No   Drug use: No   Sexual activity: Not Currently  Other Topics Concern   Not on file  Social History Narrative   Not on file   Social Determinants of Health    Financial Resource Strain: Low Risk  (03/15/2022)   Overall Financial Resource Strain (CARDIA)    Difficulty of Paying Living Expenses: Not hard at all  Food Insecurity: No Food Insecurity (03/15/2022)   Hunger Vital Sign    Worried About Running Out of Food in the Last Year: Never true    [place] in the Last Year: Never true  Transportation Needs: No Transportation Needs (03/15/2022)   PRAPARE - Hydrologist (Medical): No    Lack of Transportation (Non-Medical): No  Physical Activity: Sufficiently Active (03/15/2022)   Exercise Vital Sign    Days of Exercise per Week: 5 days    Minutes of Exercise per Session: 30 min  Stress: No Stress Concern Present (03/15/2022)   [place]    Feeling of Stress : Not at all  Social Connections: Moderately Integrated (03/15/2022)   Social Connection and Isolation Panel [NHANES]    Frequency of Communication with Friends and Family: More than three times a week    Frequency of Social Gatherings with Friends and Family: More than three times a week    Attends Religious Services: More than 4 times per year    Active Member of [employer] or Organizations: Yes    Attends Archivist Meetings: More than 4 times per year    Marital Status: Widowed  Intimate Partner Violence: Not At Risk (03/15/2022)   Humiliation, Afraid, Rape, and Kick questionnaire    Fear of Current or Ex-Partner: No    Emotionally Abused: No    Physically Abused: No    Sexually Abused: No    FAMILY HISTORY: Family History  Problem Relation Age of Onset   Hypertension Sister    Heart failure Sister    Diabetes Sister    Diabetes Brother    Hypertension Brother    Diabetes Brother    Breast cancer Neg Hx     Review of Systems  Constitutional:  Negative for appetite change, chills, fatigue, fever and unexpected weight change.  HENT:   Negative for hearing loss, lump/mass  and trouble swallowing.   Eyes:  Negative for eye problems and icterus.  Respiratory:  Negative for chest tightness, cough and shortness of breath.   Cardiovascular:  Negative for chest pain, leg swelling and palpitations.  Gastrointestinal:  Negative for abdominal distention, abdominal pain, constipation, diarrhea, nausea and vomiting.  Endocrine: Negative for hot flashes.  Genitourinary:  Negative for difficulty urinating.   Musculoskeletal:  Negative for arthralgias.  Skin:  Negative for itching and rash.  Neurological:  Negative for dizziness, extremity weakness, headaches and numbness.  Hematological:  Negative for adenopathy. Does not bruise/bleed easily.  Psychiatric/Behavioral:  Negative for depression. The patient is not nervous/anxious.       PHYSICAL EXAMINATION  ECOG PERFORMANCE STATUS: 0 - Asymptomatic  Vitals:   07/28/22 1119 07/28/22 1122  BP: (!) 150/87 (!) 148/76  Pulse: 72 67  Resp: 18   Temp: 98.1 F (  36.7 C)   SpO2: 95%     Physical Exam Constitutional:      General: She is not in acute distress.    Appearance: Normal appearance. She is not toxic-appearing.  HENT:     Head: Normocephalic and atraumatic.  Eyes:     General: No scleral icterus. Cardiovascular:     Rate and Rhythm: Normal rate and regular rhythm.     Pulses: Normal pulses.     Heart sounds: Normal heart sounds.  Pulmonary:     Effort: Pulmonary effort is normal.     Breath sounds: Normal breath sounds.  Chest:     Comments: Right breast status postlumpectomy and radiation no sign of local recurrence left breast is benign. Abdominal:     General: Abdomen is flat. Bowel sounds are normal. There is no distension.     Palpations: Abdomen is soft.     Tenderness: There is no abdominal tenderness.  Musculoskeletal:        General: No swelling.     Cervical back: Neck supple.  Lymphadenopathy:     Cervical: No cervical adenopathy.  Skin:    General: Skin is warm and dry.      Findings: No rash.  Neurological:     General: No focal deficit present.     Mental Status: She is alert.  Psychiatric:        Mood and Affect: Mood normal.        Behavior: Behavior normal.     LABORATORY DATA:  None for this visit  ASSESSMENT and THERAPY PLAN:   Malignant neoplasm of lower-inner quadrant of right breast of female, estrogen receptor positive ([place]) [name] [name] is a 71 year old woman with stage IIb right-sided estrogen positive breast cancer diagnosed in March 2015 status post right lumpectomy, adjuvant radiation, and 5 years of antiestrogen therapy with tamoxifen which completed in November 2020.  [name] [name] has no clinical or radiographic sign of breast cancer recurrence.  She will continue to undergo annual mammograms next due in September 2024.  Her bone density was normal in 2023 therefore she does not need retesting for 5 more years.  I recommended continued bone health practices including calcium vitamin D and weightbearing exercises.  Recommended healthy diet and exercise and to stay up-to-date with following up with her primary care provider and staying up-to-date with her cancer screenings--in particular colon cancer screening and talking to her primary care provider about receiving Tdap.  We will turn in 1 year for continued long-term follow-up but she knows to call if she has any questions or concerns because we can always see her sooner if needed.  All questions were answered. The patient knows to call the clinic with any problems, questions or concerns. We can certainly see the patient much sooner if necessary.  Total encounter time:20 minutes*in face-to-face visit time, chart review, lab review, care coordination, order entry, and documentation of the encounter time.    Wilber Bihari, NP 08/03/22 1:34 PM Medical Oncology and Hematology [facility], [place] [address] Tel. [phone]    Fax. [phone]  *Total  Encounter Time as defined by the Centers for Medicare and Medicaid Services includes, in addition to the face-to-face time of a patient visit (documented in the note above) non-face-to-face time: obtaining and reviewing outside history, ordering and reviewing medications, tests or procedures, care coordination (communications with other health care professionals or caregivers) and documentation in the medical record.

## 2022-07-29 ENCOUNTER — Telehealth: Payer: Self-pay | Admitting: Hematology and Oncology

## 2022-07-29 NOTE — Telephone Encounter (Signed)
Scheduled appointment per 10/12 los. Patient is aware. 

## 2022-08-03 NOTE — Assessment & Plan Note (Signed)
[name] [name] is a 71 year old woman with stage IIb right-sided estrogen positive breast cancer diagnosed in March 2015 status post right lumpectomy, adjuvant radiation, and 5 years of antiestrogen therapy with tamoxifen which completed in November 2020.  [name] [name] has no clinical or radiographic sign of breast cancer recurrence.  She will continue to undergo annual mammograms next due in September 2024.  Her bone density was normal in 2023 therefore she does not need retesting for 5 more years.  I recommended continued bone health practices including calcium vitamin D and weightbearing exercises.  Recommended healthy diet and exercise and to stay up-to-date with following up with her primary care provider and staying up-to-date with her cancer screenings--in particular colon cancer screening and talking to her primary care provider about receiving Tdap.  We will turn in 1 year for continued long-term follow-up but she knows to call if she has any questions or concerns because we can always see her sooner if needed.

## 2023-01-04 DIAGNOSIS — K760 Fatty (change of) liver, not elsewhere classified: Secondary | ICD-10-CM | POA: Diagnosis not present

## 2023-01-04 DIAGNOSIS — Z9189 Other specified personal risk factors, not elsewhere classified: Secondary | ICD-10-CM | POA: Diagnosis not present

## 2023-01-04 DIAGNOSIS — C50411 Malignant neoplasm of upper-outer quadrant of right female breast: Secondary | ICD-10-CM | POA: Diagnosis not present

## 2023-01-04 DIAGNOSIS — Z6828 Body mass index (BMI) 28.0-28.9, adult: Secondary | ICD-10-CM | POA: Diagnosis not present

## 2023-01-04 DIAGNOSIS — Z170 Estrogen receptor positive status [ER+]: Secondary | ICD-10-CM | POA: Diagnosis not present

## 2023-01-04 DIAGNOSIS — E669 Obesity, unspecified: Secondary | ICD-10-CM | POA: Diagnosis not present

## 2023-02-23 ENCOUNTER — Telehealth: Payer: Self-pay

## 2023-02-23 NOTE — Telephone Encounter (Signed)
Contacted [name] [name] to schedule their annual wellness visit. Appointment made for 03/16/23.  Agnes Lawrence, CMA (AAMA)  [facility] [phone]

## 2023-03-16 ENCOUNTER — Ambulatory Visit (INDEPENDENT_AMBULATORY_CARE_PROVIDER_SITE_OTHER): Payer: Medicare HMO

## 2023-03-16 VITALS — Ht 63.0 in | Wt 162.0 lb

## 2023-03-16 DIAGNOSIS — Z0000 Encounter for general adult medical examination without abnormal findings: Secondary | ICD-10-CM

## 2023-03-16 DIAGNOSIS — Z1211 Encounter for screening for malignant neoplasm of colon: Secondary | ICD-10-CM

## 2023-03-16 NOTE — Patient Instructions (Addendum)
[name] [name] , Thank you for taking time to come for your Medicare Wellness Visit. I appreciate your ongoing commitment to your health goals. Please review the following plan we discussed and let me know if I can assist you in the future.   These are the goals we discussed:  Goals       Lose weight (pt-stated)      My goal is to increase my physical activity.        This is a list of the screening recommended for you and due dates:  Health Maintenance  Topic Date Due   DTaP/Tdap/Td vaccine (1 - Tdap) Never done   COVID-19 Vaccine (5 - 2023-24 season) 04/01/2023*   Colon Cancer Screening  03/15/2024*   Flu Shot  05/18/2023   Mammogram  06/29/2023   Medicare Annual Wellness Visit  03/15/2024   Pneumonia Vaccine  Completed   DEXA scan (bone density measurement)  Completed   Hepatitis C Screening  Completed   Zoster (Shingles) Vaccine  Completed   HPV Vaccine  Aged Out  *Topic was postponed. The date shown is not the original due date.    Advanced directives: Advance directive discussed with you today. Even though you declined this today, please call our office should you change your mind, and we can give you the proper paperwork for you to fill out.   Conditions/risks identified: None  Next appointment: Follow up in one year for your annual wellness visit     Preventive Care 65 Years and Older, Female Preventive care refers to lifestyle choices and visits with your health care provider that can promote health and wellness. What does preventive care include? A yearly physical exam. This is also called an annual well check. Dental exams once or twice a year. Routine eye exams. Ask your health care provider how often you should have your eyes checked. Personal lifestyle choices, including: Daily care of your teeth and gums. Regular physical activity. Eating a healthy diet. Avoiding tobacco and drug use. Limiting alcohol use. Practicing safe sex. Taking low-dose aspirin every  day. Taking vitamin and mineral supplements as recommended by your health care provider. What happens during an annual well check? The services and screenings done by your health care provider during your annual well check will depend on your age, overall health, lifestyle risk factors, and family history of disease. Counseling  Your health care provider may ask you questions about your: Alcohol use. Tobacco use. Drug use. Emotional well-being. Home and relationship well-being. Sexual activity. Eating habits. History of falls. Memory and ability to understand (cognition). Work and work Astronomer. Reproductive health. Screening  You may have the following tests or measurements: Height, weight, and BMI. Blood pressure. Lipid and cholesterol levels. These may be checked every 5 years, or more frequently if you are over 29 years old. Skin check. Lung cancer screening. You may have this screening every year starting at age 81 if you have a 30-pack-year history of smoking and currently smoke or have quit within the past 15 years. Fecal occult blood test (FOBT) of the stool. You may have this test every year starting at age 69. Flexible sigmoidoscopy or colonoscopy. You may have a sigmoidoscopy every 5 years or a colonoscopy every 10 years starting at age 11. Hepatitis C blood test. Hepatitis B blood test. Sexually transmitted disease (STD) testing. Diabetes screening. This is done by checking your blood sugar (glucose) after you have not eaten for a while (fasting). You may have this done  every 1-3 years. Bone density scan. This is done to screen for osteoporosis. You may have this done starting at age 11. Mammogram. This may be done every 1-2 years. Talk to your health care provider about how often you should have regular mammograms. Talk with your health care provider about your test results, treatment options, and if necessary, the need for more tests. Vaccines  Your health care  provider may recommend certain vaccines, such as: Influenza vaccine. This is recommended every year. Tetanus, diphtheria, and acellular pertussis (Tdap, Td) vaccine. You may need a Td booster every 10 years. Zoster vaccine. You may need this after age 2. Pneumococcal 13-valent conjugate (PCV13) vaccine. One dose is recommended after age 38. Pneumococcal polysaccharide (PPSV23) vaccine. One dose is recommended after age 22. Talk to your health care provider about which screenings and vaccines you need and how often you need them. This information is not intended to replace advice given to you by your health care provider. Make sure you discuss any questions you have with your health care provider. Document Released: 10/30/2015 Document Revised: 06/22/2016 Document Reviewed: 08/04/2015 Elsevier Interactive Patient Education  2017 [employer].  Fall Prevention in the Home Falls can cause injuries. They can happen to people of all ages. There are many things you can do to make your home safe and to help prevent falls. What can I do on the outside of my home? Regularly fix the edges of walkways and driveways and fix any cracks. Remove anything that might make you trip as you walk through a door, such as a raised step or threshold. Trim any bushes or trees on the path to your home. Use bright outdoor lighting. Clear any walking paths of anything that might make someone trip, such as rocks or tools. Regularly check to see if handrails are loose or broken. Make sure that both sides of any steps have handrails. Any raised decks and porches should have guardrails on the edges. Have any leaves, snow, or ice cleared regularly. Use sand or salt on walking paths during winter. Clean up any spills in your garage right away. This includes oil or grease spills. What can I do in the bathroom? Use night lights. Install grab bars by the toilet and in the tub and shower. Do not use towel bars as grab  bars. Use non-skid mats or decals in the tub or shower. If you need to sit down in the shower, use a plastic, non-slip stool. Keep the floor dry. Clean up any water that spills on the floor as soon as it happens. Remove soap buildup in the tub or shower regularly. Attach bath mats securely with double-sided non-slip rug tape. Do not have throw rugs and other things on the floor that can make you trip. What can I do in the bedroom? Use night lights. Make sure that you have a light by your bed that is easy to reach. Do not use any sheets or blankets that are too big for your bed. They should not hang down onto the floor. Have a firm chair that has side arms. You can use this for support while you get dressed. Do not have throw rugs and other things on the floor that can make you trip. What can I do in the kitchen? Clean up any spills right away. Avoid walking on wet floors. Keep items that you use a lot in easy-to-reach places. If you need to reach something above you, use a strong step stool that has  a grab bar. Keep electrical cords out of the way. Do not use floor polish or wax that makes floors slippery. If you must use wax, use non-skid floor wax. Do not have throw rugs and other things on the floor that can make you trip. What can I do with my stairs? Do not leave any items on the stairs. Make sure that there are handrails on both sides of the stairs and use them. Fix handrails that are broken or loose. Make sure that handrails are as long as the stairways. Check any carpeting to make sure that it is firmly attached to the stairs. Fix any carpet that is loose or worn. Avoid having throw rugs at the top or bottom of the stairs. If you do have throw rugs, attach them to the floor with carpet tape. Make sure that you have a light switch at the top of the stairs and the bottom of the stairs. If you do not have them, ask someone to add them for you. What else can I do to help prevent  falls? Wear shoes that: Do not have high heels. Have rubber bottoms. Are comfortable and fit you well. Are closed at the toe. Do not wear sandals. If you use a stepladder: Make sure that it is fully opened. Do not climb a closed stepladder. Make sure that both sides of the stepladder are locked into place. Ask someone to hold it for you, if possible. Clearly mark and make sure that you can see: Any grab bars or handrails. First and last steps. Where the edge of each step is. Use tools that help you move around (mobility aids) if they are needed. These include: Canes. Walkers. Scooters. Crutches. Turn on the lights when you go into a dark area. Replace any light bulbs as soon as they burn out. Set up your furniture so you have a clear path. Avoid moving your furniture around. If any of your floors are uneven, fix them. If there are any pets around you, be aware of where they are. Review your medicines with your doctor. Some medicines can make you feel dizzy. This can increase your chance of falling. Ask your doctor what other things that you can do to help prevent falls. This information is not intended to replace advice given to you by your health care provider. Make sure you discuss any questions you have with your health care provider. Document Released: 07/30/2009 Document Revised: 03/10/2016 Document Reviewed: 11/07/2014 Elsevier Interactive Patient Education  2017 [employer].

## 2023-03-16 NOTE — Progress Notes (Signed)
Subjective:   [name] [name] is a 72 y.o. female who presents for Medicare Annual (Subsequent) preventive examination.  Review of Systems    Virtual Visit via Telephone Note  I connected with  [name] [name] on 03/16/23 at  9:30 AM EDT by telephone and verified that I am speaking with the correct person using two identifiers.  Location: Patient: Home Provider: Office Persons participating in the virtual visit: patient/Nurse Health Advisor   I discussed the limitations, risks, security and privacy concerns of performing an evaluation and management service by telephone and the availability of in person appointments. The patient expressed understanding and agreed to proceed.  Interactive audio and video telecommunications were attempted between this nurse and patient, however failed, due to patient having technical difficulties OR patient did not have access to video capability.  We continued and completed visit with audio only.  Some vital signs may be absent or patient reported.   Tillie Rung, LPN  Cardiac Risk Factors include: advanced age (>73men, >26 women)     Objective:    Today's Vitals   03/16/23 1027  Weight: 162 lb (73.5 kg)  Height: 5\' 3"  (1.6 m)   Body mass index is 28.7 kg/m.     03/16/2023   10:36 AM 03/15/2022    3:51 PM 11/24/2021    1:20 PM 07/22/2020   10:06 AM 09/08/2016   11:07 PM 09/07/2016   10:14 AM 09/08/2015    9:21 AM  Advanced Directives  Does Patient Have a Medical Advance Directive? No No No No No No No  Would patient like information on creating a medical advance directive? No - Patient declined No - Patient declined No - Patient declined No - Patient declined       Current Medications (verified) Outpatient Encounter Medications as of 03/16/2023  Medication Sig   Cholecalciferol (VITAMIN D3) 1000 units CAPS Take by mouth daily.   ciclopirox (PENLAC) 8 % solution Apply topically at bedtime. Apply over nail and surrounding  skin. Apply daily over previous coat. After seven (7) days, may remove with alcohol and continue cycle.   clobetasol ointment (TEMOVATE) 0.05 % Apply 1 Application topically 2 (two) times daily. (Patient not taking: Reported on 07/28/2022)   ferrous sulfate 324 MG TBEC Take 324 mg by mouth.   hydrOXYzine (ATARAX) 10 MG tablet Take 1 tablet (10 mg total) by mouth 3 (three) times daily as needed. (Patient not taking: Reported on 07/28/2022)   zinc gluconate 50 MG tablet Take 50 mg by mouth daily.   No facility-administered encounter medications on file as of 03/16/2023.    Allergies (verified) Peanut-containing drug products and Codeine   History: Past Medical History:  Diagnosis Date   Arthritis    right knee   Cancer (HCC)    breast   Full dentures    Personal history of radiation therapy    Radiation    Left Breast   Wears glasses    Past Surgical History:  Procedure Laterality Date   APPENDECTOMY     AXILLARY SENTINEL NODE BIOPSY Right 01/29/2014   Procedure: RIGHT AXILLARY SENTINEL LYMPHNODE BIOPSY;  Surgeon: Ernestene Mention, MD;  Location: [facility];  Service: General;  Laterality: Right;   BREAST LUMPECTOMY Right 12/16/2013   Procedure: LUMPECTOMY;  Surgeon: Ernestene Mention, MD;  Location: [facility];  Service: General;  Laterality: Right;   TUBAL LIGATION     Family History  Problem Relation Age of Onset  Hypertension Sister    Heart failure Sister    Diabetes Sister    Diabetes Brother    Hypertension Brother    Diabetes Brother    Breast cancer Neg Hx    Social History   Socioeconomic History   Marital status: Widowed    Spouse name: Not on file   Number of children: 2   Years of education: Not on file   Highest education level: Not on file  Occupational History   Occupation: COOK    Employer: [employer]  Tobacco Use   Smoking status: Former    Types: Cigarettes    Quit date: 12/10/1990    Years since quitting: 32.2    Smokeless tobacco: Never  Vaping Use   Vaping Use: Never used  Substance and Sexual Activity   Alcohol use: No   Drug use: No   Sexual activity: Not Currently  Other Topics Concern   Not on file  Social History Narrative   Not on file   Social Determinants of Health   Financial Resource Strain: Low Risk  (03/15/2022)   Overall Financial Resource Strain (CARDIA)    Difficulty of Paying Living Expenses: Not hard at all  Food Insecurity: No Food Insecurity (03/16/2023)   Hunger Vital Sign    Worried About Running Out of Food in the Last Year: Never true    Ran Out of Food in the Last Year: Never true  Transportation Needs: No Transportation Needs (03/15/2022)   PRAPARE - Administrator, Civil Service (Medical): No    Lack of Transportation (Non-Medical): No  Physical Activity: Sufficiently Active (03/15/2022)   Exercise Vital Sign    Days of Exercise per Week: 5 days    Minutes of Exercise per Session: 30 min  Stress: No Stress Concern Present (03/15/2022)   [employer] of Occupational Health - Occupational Stress Questionnaire    Feeling of Stress : Not at all  Social Connections: Moderately Integrated (03/15/2022)   Social Connection and Isolation Panel [NHANES]    Frequency of Communication with Friends and Family: More than three times a week    Frequency of Social Gatherings with Friends and Family: More than three times a week    Attends Religious Services: More than 4 times per year    Active Member of [employer] or Organizations: Yes    Attends Banker Meetings: More than 4 times per year    Marital Status: Widowed    Tobacco Counseling Counseling given: Not Answered   Clinical Intake:  Pre-visit preparation completed: No  Pain : No/denies pain     BMI - recorded: 28.7 Nutritional Status: BMI 25 -29 Overweight Nutritional Risks: None Diabetes: No  How often do you need to have someone help you when you read instructions, pamphlets, or  other written materials from your doctor or pharmacy?: 1 - Never  Diabetic?  No  Interpreter Needed?: No  Information entered by :: Theresa Mulligan LPN   Activities of Daily Living    03/16/2023   10:33 AM  In your present state of health, do you have any difficulty performing the following activities:  Hearing? 0  Vision? 0  Difficulty concentrating or making decisions? 0  Walking or climbing stairs? 0  Dressing or bathing? 0  Doing errands, shopping? 0  Preparing Food and eating ? N  Using the Toilet? N  In the past six months, have you accidently leaked urine? Y  Comment Wears breifs. Followed by PCP  Do you have problems with loss of bowel control? N  Managing your Medications? N  Managing your Finances? N  Housekeeping or managing your Housekeeping? N    Patient Care Team: Etta Grandchild, MD as PCP - General (Internal Medicine) Charna Elizabeth, MD as Consulting Physician (Gastroenterology) Axel Filler, Larna Daughters, NP as Nurse Practitioner (Hematology and Oncology) Mathis Bud, MD as Referring Physician (Internal Medicine) Genia Del Daisy Blossom, MD as Consulting Physician (Ophthalmology)  Indicate any recent Medical Services you may have received from other than [facility] providers in the past year (date may be approximate).     Assessment:   This is a routine wellness examination for [name] [name].  Hearing/Vision screen Hearing Screening - Comments:: Denies hearing difficulties   Vision Screening - Comments:: Wears rx glasses - [facility]  Dietary issues and exercise activities discussed: Exercise limited by: None identified   Goals Addressed               This Visit's Progress     Lose weight (pt-stated)         Depression Screen    03/15/2022    3:56 PM 09/22/2021   10:12 AM 10/03/2017   10:58 AM  PHQ 2/9 Scores  PHQ - 2 Score 0 0 0    Fall Risk    03/16/2023   10:35 AM 03/15/2022    3:53 PM 09/22/2021   10:11  AM 10/03/2017   10:58 AM  Fall Risk   Falls in the past year? 1 0 0 No  Number falls in past yr: 0 0    Injury with Fall? 0 0    Risk for fall due to : No Fall Risks No Fall Risks    Follow up Falls prevention discussed Falls evaluation completed      FALL RISK PREVENTION PERTAINING TO THE HOME:  Any stairs in or around the home? Yes  If so, are there any without handrails? No  Home free of loose throw rugs in walkways, pet beds, electrical cords, etc? Yes  Adequate lighting in your home to reduce risk of falls? Yes   ASSISTIVE DEVICES UTILIZED TO PREVENT FALLS:  Life alert? No  Use of a cane, walker or w/c? No  Grab bars in the bathroom? Yes Shower chair or bench in shower? Yes Elevated toilet seat or a handicapped toilet? Yes  TIMED UP AND GO:  Was the test performed? No . Audio Visit  Cognitive Function:        03/16/2023   10:36 AM 03/15/2022    4:01 PM  6CIT Screen  What Year? 0 points 0 points  What month? 0 points 0 points  What time? 0 points 0 points  Count back from 20 0 points 0 points  Months in reverse 0 points 0 points  Repeat phrase 0 points 0 points  Total Score 0 points 0 points    Immunizations Immunization History  Administered Date(s) Administered   Fluad Quad(high Dose 65+) 09/22/2021   PFIZER(Purple Top)SARS-COV-2 Vaccination 02/06/2020, 03/02/2020, 09/21/2020   PNEUMOCOCCAL CONJUGATE-20 09/22/2021   Pfizer Covid-19 Vaccine Bivalent Booster 46yrs & up 07/15/2021   Zoster Recombinat (Shingrix) 11/07/2019, 01/05/2020    TDAP status: Due, Education has been provided regarding the importance of this vaccine. Advised may receive this vaccine at local pharmacy or Health Dept. Aware to provide a copy of the vaccination record if obtained from local pharmacy or Health Dept. Verbalized acceptance and understanding.  Flu  Vaccine status: Up to date  Pneumococcal vaccine status: Up to date  Covid-19 vaccine status: Completed vaccines  Qualifies  for Shingles Vaccine? Yes   Zostavax completed Yes   Shingrix Completed?: Yes  Screening Tests Health Maintenance  Topic Date Due   DTaP/Tdap/Td (1 - Tdap) Never done   COVID-19 Vaccine (5 - 2023-24 season) 04/01/2023 (Originally 06/17/2022)   Colonoscopy  03/15/2024 (Originally 07/19/2022)   INFLUENZA VACCINE  05/18/2023   MAMMOGRAM  06/29/2023   Medicare Annual Wellness (AWV)  03/15/2024   Pneumonia Vaccine 19+ Years old  Completed   DEXA SCAN  Completed   Hepatitis C Screening  Completed   Zoster Vaccines- Shingrix  Completed   HPV VACCINES  Aged Out    Health Maintenance  Health Maintenance Due  Topic Date Due   DTaP/Tdap/Td (1 - Tdap) Never done    Colorectal cancer screening: Referral to GI placed 03/16/23. Pt aware the office will call re: appt.  Mammogram status: Completed 06/28/22. Repeat every year  Bone Density status: Completed 06/29/22. Results reflect: Bone density results: OSTEOPOROSIS. Repeat every   years.  Lung Cancer Screening: (Low Dose CT Chest recommended if Age 42-80 years, 30 pack-year currently smoking OR have quit w/in 15years.) does not qualify.     Additional Screening:  Hepatitis C Screening: does qualify; Completed 09/22/21  Vision Screening: Recommended annual ophthalmology exams for early detection of glaucoma and other disorders of the eye. Is the patient up to date with their annual eye exam?  Yes  Who is the provider or what is the name of the office in which the patient attends annual eye exams? [facility] If pt is not established with a provider, would they like to be referred to a provider to establish care? No .   Dental Screening: Recommended annual dental exams for proper oral hygiene  Community Resource Referral / Chronic Care Management:  CRR required this visit?  No   CCM required this visit?  No      Plan:     I have personally reviewed and noted the following in the patient's chart:   Medical and social history Use of  alcohol, tobacco or illicit drugs  Current medications and supplements including opioid prescriptions. Patient is not currently taking opioid prescriptions. Functional ability and status Nutritional status Physical activity Advanced directives List of other physicians Hospitalizations, surgeries, and ER visits in previous 12 months Vitals Screenings to include cognitive, depression, and falls Referrals and appointments  In addition, I have reviewed and discussed with patient certain preventive protocols, quality metrics, and best practice recommendations. A written personalized care plan for preventive services as well as general preventive health recommendations were provided to patient.     Tillie Rung, LPN   1/61/0960   Nurse Notes: None

## 2023-03-17 ENCOUNTER — Ambulatory Visit: Payer: Medicare HMO

## 2023-03-31 DIAGNOSIS — H40013 Open angle with borderline findings, low risk, bilateral: Secondary | ICD-10-CM | POA: Diagnosis not present

## 2023-04-04 DIAGNOSIS — K5900 Constipation, unspecified: Secondary | ICD-10-CM | POA: Diagnosis not present

## 2023-04-04 DIAGNOSIS — D509 Iron deficiency anemia, unspecified: Secondary | ICD-10-CM | POA: Diagnosis not present

## 2023-04-04 DIAGNOSIS — K641 Second degree hemorrhoids: Secondary | ICD-10-CM | POA: Diagnosis not present

## 2023-04-28 DIAGNOSIS — E785 Hyperlipidemia, unspecified: Secondary | ICD-10-CM | POA: Diagnosis not present

## 2023-04-28 DIAGNOSIS — R7303 Prediabetes: Secondary | ICD-10-CM | POA: Diagnosis not present

## 2023-04-28 DIAGNOSIS — Z853 Personal history of malignant neoplasm of breast: Secondary | ICD-10-CM | POA: Diagnosis not present

## 2023-04-28 DIAGNOSIS — E663 Overweight: Secondary | ICD-10-CM | POA: Diagnosis not present

## 2023-04-28 DIAGNOSIS — Z6828 Body mass index (BMI) 28.0-28.9, adult: Secondary | ICD-10-CM | POA: Diagnosis not present

## 2023-05-19 ENCOUNTER — Other Ambulatory Visit: Payer: Self-pay | Admitting: Internal Medicine

## 2023-05-19 DIAGNOSIS — Z1231 Encounter for screening mammogram for malignant neoplasm of breast: Secondary | ICD-10-CM

## 2023-07-04 ENCOUNTER — Ambulatory Visit
Admission: RE | Admit: 2023-07-04 | Discharge: 2023-07-04 | Disposition: A | Discharge status: Home / Self Care | Payer: Medicare HMO | Source: Ambulatory Visit | Attending: Internal Medicine | Admitting: Internal Medicine

## 2023-07-04 DIAGNOSIS — Z1231 Encounter for screening mammogram for malignant neoplasm of breast: Secondary | ICD-10-CM

## 2023-07-17 ENCOUNTER — Ambulatory Visit (INDEPENDENT_AMBULATORY_CARE_PROVIDER_SITE_OTHER): Payer: Medicare HMO | Admitting: Internal Medicine

## 2023-07-17 ENCOUNTER — Encounter: Payer: Self-pay | Admitting: Internal Medicine

## 2023-07-17 VITALS — BP 134/78 | HR 85 | Temp 98.1°F | Resp 16 | Ht 63.0 in | Wt 161.0 lb

## 2023-07-17 DIAGNOSIS — E785 Hyperlipidemia, unspecified: Secondary | ICD-10-CM | POA: Diagnosis not present

## 2023-07-17 DIAGNOSIS — N1831 Chronic kidney disease, stage 3a: Secondary | ICD-10-CM | POA: Diagnosis not present

## 2023-07-17 DIAGNOSIS — I10 Essential (primary) hypertension: Secondary | ICD-10-CM | POA: Insufficient documentation

## 2023-07-17 DIAGNOSIS — Z0001 Encounter for general adult medical examination with abnormal findings: Secondary | ICD-10-CM | POA: Diagnosis not present

## 2023-07-17 DIAGNOSIS — R7303 Prediabetes: Secondary | ICD-10-CM

## 2023-07-17 DIAGNOSIS — Z23 Encounter for immunization: Secondary | ICD-10-CM | POA: Diagnosis not present

## 2023-07-17 MED ORDER — BOOSTRIX 5-2.5-18.5 LF-MCG/0.5 IM SUSP: 0.5000 mL | Freq: Once | INTRAMUSCULAR | 0 refills | Status: AC

## 2023-07-17 NOTE — Patient Instructions (Signed)

## 2023-07-17 NOTE — Progress Notes (Unsigned)
Subjective:  Patient ID: [name] [name], female    DOB: [birth year]  Age: 72 y.o. MRN: [id number]  CC: Annual Exam and Hypertension   HPI [name] [name] presents for a CPX and f/up ----  Discussed the use of AI scribe software for clinical note transcription with the patient, who gave verbal consent to proceed.  History of Present Illness   The patient, with a history of arthritis, presents with no acute complaints. They report an active lifestyle, walking approximately 2-3 flights of stairs without DOE, CP, or edema. They have been gradually increasing their endurance, at one point walking around the block twice, but without any associated chest pain or shortness of breath. They deny any lower extremity swelling.  For their arthritis pain, they occasionally take Tylenol. Their diet is primarily composed of salads, fruits, chicken, and fish, with a conscious avoidance of red meat.       Outpatient Medications Prior to Visit  Medication Sig Dispense Refill   Cholecalciferol (VITAMIN D3) 1000 units CAPS Take by mouth daily.     ciclopirox (PENLAC) 8 % solution Apply topically at bedtime. Apply over nail and surrounding skin. Apply daily over previous coat. After seven (7) days, may remove with alcohol and continue cycle. 6.6 mL 0   clobetasol ointment (TEMOVATE) 0.05 % Apply 1 Application topically 2 (two) times daily. 45 g 0   ferrous sulfate 324 MG TBEC Take 324 mg by mouth.     hydrOXYzine (ATARAX) 10 MG tablet Take 1 tablet (10 mg total) by mouth 3 (three) times daily as needed. 30 tablet 0   zinc gluconate 50 MG tablet Take 50 mg by mouth daily.     No facility-administered medications prior to visit.    ROS Review of Systems  Objective:  BP 134/78 (BP Location: Left Arm, Patient Position: Sitting, Cuff Size: Large)   Pulse 85   Temp 98.1 F (36.7 C) (Oral)   Resp 16   Ht 5\' 3"  (1.6 m)   Wt 161 lb (73 kg)   SpO2 95%   BMI 28.52 kg/m   BP Readings from Last  3 Encounters:  07/17/23 134/78  07/28/22 (!) 148/76  04/13/22 128/76    Wt Readings from Last 3 Encounters:  07/17/23 161 lb (73 kg)  03/16/23 162 lb (73.5 kg)  07/28/22 167 lb (75.8 kg)    Physical Exam Cardiovascular:     Comments: EKG- NSR, 63 bpm No LVH, Q waves, or ST/T waves  Musculoskeletal:     Right lower leg: No edema.     Left lower leg: No edema.     Lab Results  Component Value Date   WBC 6.0 07/17/2023   HGB 13.6 07/17/2023   HCT 41.7 07/17/2023   PLT 184.0 07/17/2023   GLUCOSE 80 07/17/2023   CHOL 195 07/17/2023   TRIG (H) 07/17/2023    431.0 Triglyceride is over 400; calculations on Lipids are invalid.   HDL 38.20 (L) 07/17/2023   LDLDIRECT 110.0 07/17/2023   LDLCALC 130 (H) 09/22/2021   ALT 11 07/17/2023   AST 16 07/17/2023   NA 140 07/17/2023   K 4.5 07/17/2023   CL 105 07/17/2023   CREATININE 1.17 07/17/2023   BUN 20 07/17/2023   CO2 28 07/17/2023   TSH 3.25 07/17/2023   HGBA1C 5.9 07/17/2023    MM 3D SCREENING MAMMOGRAM BILATERAL BREAST  Result Date: 07/05/2023 CLINICAL DATA:  Screening. EXAM: DIGITAL SCREENING BILATERAL MAMMOGRAM WITH TOMOSYNTHESIS AND CAD TECHNIQUE: Bilateral  screening digital craniocaudal and mediolateral oblique mammograms were obtained. Bilateral screening digital breast tomosynthesis was performed. The images were evaluated with computer-aided detection. COMPARISON:  Previous exam(s). ACR Breast Density Category b: There are scattered areas of fibroglandular density. FINDINGS: There are no findings suspicious for malignancy. IMPRESSION: No mammographic evidence of malignancy. A result letter of this screening mammogram will be mailed directly to the patient. RECOMMENDATION: Screening mammogram in one year. (Code:SM-B-01Y) BI-RADS CATEGORY  1: Negative. Electronically Signed   By: Elberta Fortis M.D.   On: 07/05/2023 11:01    Assessment & Plan:  Hyperlipidemia with target LDL less than 130 -     Lipid panel; Future -      TSH; Future -     Hepatic function panel; Future  Stage 3a chronic kidney disease (HCC) -     Urinalysis, Routine w reflex microscopic; Future -     Basic metabolic panel; Future -     CBC with Differential/Platelet; Future  Prediabetes -     Hemoglobin A1c; Future -     Basic metabolic panel; Future  Need for prophylactic vaccination with combined diphtheria-tetanus-pertussis (DTP) vaccine -     Boostrix; Inject 0.5 mLs into the muscle once for 1 dose.  Dispense: 0.5 mL; Refill: 0  Encounter for general adult medical examination with abnormal findings -     EKG 12-Lead  Need for vaccination -     Flu Vaccine Trivalent High Dose (Fluad)  Primary hypertension  Other orders -     LDL cholesterol, direct     Follow-up: Return in about 6 months (around 01/14/2024).  Sanda Linger, MD

## 2023-07-18 ENCOUNTER — Encounter: Payer: Self-pay | Admitting: Internal Medicine

## 2023-07-18 LAB — URINALYSIS, ROUTINE W REFLEX MICROSCOPIC
Bilirubin Urine: NEGATIVE
Hgb urine dipstick: NEGATIVE
Ketones, ur: NEGATIVE
Nitrite: NEGATIVE
RBC / HPF: NONE SEEN (ref 0–?)
Specific Gravity, Urine: >=1.03 — AB (ref 1.000–1.030)
Total Protein, Urine: NEGATIVE
Urine Glucose: NEGATIVE
Urobilinogen, UA: 0.2 (ref 0.0–1.0)
pH: 6 (ref 5.0–8.0)

## 2023-07-18 LAB — CBC WITH DIFFERENTIAL/PLATELET
Basophils Absolute: 0.1 10*3/uL (ref 0.0–0.1)
Basophils Relative: 1 % (ref 0.0–3.0)
Eosinophils Absolute: 0.1 10*3/uL (ref 0.0–0.7)
Eosinophils Relative: 2 % (ref 0.0–5.0)
HCT: 41.7 % (ref 36.0–46.0)
Hemoglobin: 13.6 g/dL (ref 12.0–15.0)
Lymphocytes Relative: 39 % (ref 12.0–46.0)
Lymphs Abs: 2.3 10*3/uL (ref 0.7–4.0)
MCHC: 32.5 g/dL (ref 30.0–36.0)
MCV: 82.5 fL (ref 78.0–100.0)
Monocytes Absolute: 0.5 10*3/uL (ref 0.1–1.0)
Monocytes Relative: 9 % (ref 3.0–12.0)
Neutro Abs: 2.9 10*3/uL (ref 1.4–7.7)
Neutrophils Relative %: 49 % (ref 43.0–77.0)
Platelets: 184 10*3/uL (ref 150.0–400.0)
RBC: 5.06 Mil/uL (ref 3.87–5.11)
RDW: 14.1 % (ref 11.5–15.5)
WBC: 6 10*3/uL (ref 4.0–10.5)

## 2023-07-18 LAB — HEPATIC FUNCTION PANEL
ALT: 11 U/L (ref 0–35)
AST: 16 U/L (ref 0–37)
Albumin: 4.2 g/dL (ref 3.5–5.2)
Alkaline Phosphatase: 81 U/L (ref 39–117)
Bilirubin, Direct: 0 mg/dL (ref 0.0–0.3)
Total Bilirubin: 0.4 mg/dL (ref 0.2–1.2)
Total Protein: 7.3 g/dL (ref 6.0–8.3)

## 2023-07-18 LAB — LIPID PANEL
Cholesterol: 195 mg/dL (ref 0–200)
HDL: 38.2 mg/dL — ABNORMAL LOW (ref >39.00)
Total CHOL/HDL Ratio: 5
Triglycerides: 431 mg/dL — ABNORMAL HIGH (ref 0.0–149.0)

## 2023-07-18 LAB — BASIC METABOLIC PANEL
BUN: 20 mg/dL (ref 6–23)
CO2: 28 meq/L (ref 19–32)
Calcium: 10 mg/dL (ref 8.4–10.5)
Chloride: 105 meq/L (ref 96–112)
Creatinine, Ser: 1.17 mg/dL (ref 0.40–1.20)
GFR: 46.16 mL/min — ABNORMAL LOW (ref >60.00)
Glucose, Bld: 80 mg/dL (ref 70–99)
Potassium: 4.5 meq/L (ref 3.5–5.1)
Sodium: 140 meq/L (ref 135–145)

## 2023-07-18 LAB — HEMOGLOBIN A1C: Hgb A1c MFr Bld: 5.9 % (ref 4.6–6.5)

## 2023-07-18 LAB — LDL CHOLESTEROL, DIRECT: Direct LDL: 110 mg/dL

## 2023-07-18 LAB — TSH: TSH: 3.25 u[IU]/mL (ref 0.35–5.50)

## 2023-07-18 MED ORDER — ROSUVASTATIN CALCIUM 10 MG PO TABS: 10.0000 mg | ORAL_TABLET | Freq: Every day | ORAL | 1 refills | Status: DC

## 2023-07-21 ENCOUNTER — Telehealth: Payer: Self-pay | Admitting: Internal Medicine

## 2023-07-21 NOTE — Telephone Encounter (Signed)
Patient called and would like to discuss her lab results - please call her at [phone]

## 2023-07-24 NOTE — Telephone Encounter (Signed)
LVM to discuss 10/1 result letter"  "Your kidney function is stable. Your LDL-cholesterol is high. I have prescribed a statin to reduce your risk or heart attack and stroke. The other labs were normal."

## 2023-07-29 NOTE — Progress Notes (Unsigned)
[facility] Cancer Follow up:    [name] Grandchild, MD [address] [place] [address]   DIAGNOSIS: Cancer Staging  Malignant neoplasm of lower-inner quadrant of right breast of female, estrogen receptor positive (HCC) Staging form: Breast, AJCC 7th Edition - Clinical: No stage assigned - Unsigned Histopathologic type: Adenoid cystic carcinoma Laterality: Right Tumor size (mm): 52 - Pathologic: Stage IIB (T3, N0, cM0) - Signed by Lurline Hare, MD on 02/06/2014 Histopathologic type: Adenoid cystic carcinoma Laterality: Right Tumor size (mm): 52   SUMMARY OF ONCOLOGIC HISTORY: [place] woman Status post right upper outer quadrant breast biopsy to 12/06/2013 for an atypical papillary lesion.   (1) right lumpectomy March 2015 showed an intracystic papillary carcinoma measuring 5.2 cm, grade 1, 100% estrogen and 100% progesterone receptor positive, with an MIB-1 of 8% and no HER-2 amplification.   (2) status post right axillary lymph node sampling 01/29/2014, both sentinel lymph nodes being clear.   (3) Oncotype score of 0 predicts a 10 years risk of outside the breast recurrence of 3% if the patient's only systemic therapy is tamoxifen for 5 years   (4) adjuvant radiation 03/17/2014-04/17/2014:   Right breast/ 50 Gy in 25 fractions at 2 Gy per fraction (last 2 treatments given BID)    (5) tamoxifen 05/17/2014-08/2019  CURRENT THERAPY: observation  INTERVAL HISTORY: [name] [name] 72 y.o. female returns for f/u of her history of right breast cancer.  She continues on observation alone.  Her most recent mammogram occurred on 07/05/2023 demonstrating no mammographic evidence of malignancy and breast density category B.    Patient Active Problem List   Diagnosis Date Noted   Primary hypertension 07/17/2023   Hyperlipidemia with target LDL less than 130 09/22/2021   Encounter for general adult medical examination with abnormal findings 09/22/2021    Prediabetes 09/22/2021   Stage 3a chronic kidney disease (HCC) 09/22/2021   Need for prophylactic vaccination with combined diphtheria-tetanus-pertussis (DTP) vaccine 09/22/2021   Flu vaccine need 09/22/2021   Need for vaccination 09/22/2021   Malignant neoplasm of lower-inner quadrant of right breast of female, estrogen receptor positive (HCC) 12/31/2013    is allergic to peanut-containing drug products and codeine.  MEDICAL HISTORY: Past Medical History:  Diagnosis Date   Arthritis    right knee   Cancer (HCC)    breast   Full dentures    Personal history of radiation therapy    Radiation    Left Breast   Wears glasses     SURGICAL HISTORY: Past Surgical History:  Procedure Laterality Date   APPENDECTOMY     AXILLARY SENTINEL NODE BIOPSY Right 01/29/2014   Procedure: RIGHT AXILLARY SENTINEL LYMPHNODE BIOPSY;  Surgeon: Ernestene Mention, MD;  Location: [facility];  Service: General;  Laterality: Right;   BREAST LUMPECTOMY Right 12/16/2013   Procedure: LUMPECTOMY;  Surgeon: Ernestene Mention, MD;  Location: [facility];  Service: General;  Laterality: Right;   TUBAL LIGATION      SOCIAL HISTORY: Social History   Socioeconomic History   Marital status: Widowed    Spouse name: Not on file   Number of children: 2   Years of education: Not on file   Highest education level: Not on file  Occupational History   Occupation: COOK    Employer: [employer]  Tobacco Use   Smoking status: Former    Current packs/day: 0.00    Types: Cigarettes    Quit date: 12/10/1990  Years since quitting: 32.6   Smokeless tobacco: Never  Vaping Use   Vaping status: Never Used  Substance and Sexual Activity   Alcohol use: No   Drug use: No   Sexual activity: Not Currently  Other Topics Concern   Not on file  Social History Narrative   Not on file   Social Determinants of Health   Financial Resource Strain: Low Risk  (03/15/2022)   Overall Financial  Resource Strain (CARDIA)    Difficulty of Paying Living Expenses: Not hard at all  Food Insecurity: No Food Insecurity (03/16/2023)   Hunger Vital Sign    Worried About Running Out of Food in the Last Year: Never true    Ran Out of Food in the Last Year: Never true  Transportation Needs: No Transportation Needs (03/15/2022)   PRAPARE - Administrator, Civil Service (Medical): No    Lack of Transportation (Non-Medical): No  Physical Activity: Sufficiently Active (03/15/2022)   Exercise Vital Sign    Days of Exercise per Week: 5 days    Minutes of Exercise per Session: 30 min  Stress: No Stress Concern Present (03/15/2022)   [employer] of Occupational Health - Occupational Stress Questionnaire    Feeling of Stress : Not at all  Social Connections: Moderately Integrated (03/15/2022)   Social Connection and Isolation Panel [NHANES]    Frequency of Communication with Friends and Family: More than three times a week    Frequency of Social Gatherings with Friends and Family: More than three times a week    Attends Religious Services: More than 4 times per year    Active Member of [employer] or Organizations: Yes    Attends Banker Meetings: More than 4 times per year    Marital Status: Widowed  Intimate Partner Violence: Not At Risk (03/15/2022)   Humiliation, Afraid, Rape, and Kick questionnaire    Fear of Current or Ex-Partner: No    Emotionally Abused: No    Physically Abused: No    Sexually Abused: No    FAMILY HISTORY: Family History  Problem Relation Age of Onset   Hypertension Sister    Heart failure Sister    Diabetes Sister    Diabetes Brother    Hypertension Brother    Diabetes Brother    Breast cancer Neg Hx     Review of Systems  Constitutional:  Negative for appetite change, chills, fatigue, fever and unexpected weight change.  HENT:   Negative for hearing loss, lump/mass and trouble swallowing.   Eyes:  Negative for eye problems and icterus.   Respiratory:  Negative for chest tightness, cough and shortness of breath.   Cardiovascular:  Negative for chest pain, leg swelling and palpitations.  Gastrointestinal:  Negative for abdominal distention, abdominal pain, constipation, diarrhea, nausea and vomiting.  Endocrine: Negative for hot flashes.  Genitourinary:  Negative for difficulty urinating.   Musculoskeletal:  Negative for arthralgias.  Skin:  Negative for itching and rash.  Neurological:  Negative for dizziness, extremity weakness, headaches and numbness.  Hematological:  Negative for adenopathy. Does not bruise/bleed easily.  Psychiatric/Behavioral:  Negative for depression. The patient is not nervous/anxious.       PHYSICAL EXAMINATION    There were no vitals filed for this visit.  Physical Exam Constitutional:      General: She is not in acute distress.    Appearance: Normal appearance. She is not toxic-appearing.  HENT:     Head: Normocephalic and atraumatic.  Mouth/Throat:     Mouth: Mucous membranes are moist.     Pharynx: Oropharynx is clear. No oropharyngeal exudate or posterior oropharyngeal erythema.  Eyes:     General: No scleral icterus. Cardiovascular:     Rate and Rhythm: Normal rate and regular rhythm.     Pulses: Normal pulses.     Heart sounds: Normal heart sounds.  Pulmonary:     Effort: Pulmonary effort is normal.     Breath sounds: Normal breath sounds.  Chest:     Comments: Right breast s/p lumpectomy and radiation, no sign of local recurrence, left breast benign.  Abdominal:     General: Abdomen is flat. Bowel sounds are normal. There is no distension.     Palpations: Abdomen is soft.     Tenderness: There is no abdominal tenderness.  Musculoskeletal:        General: No swelling.     Cervical back: Neck supple.  Lymphadenopathy:     Cervical: No cervical adenopathy.  Skin:    General: Skin is warm and dry.     Findings: No rash.  Neurological:     General: No focal deficit  present.     Mental Status: She is alert.  Psychiatric:        Mood and Affect: Mood normal.        Behavior: Behavior normal.       ASSESSMENT and THERAPY PLAN:   No problem-specific Assessment & Plan notes found for this encounter.    All questions were answered. The patient knows to call the clinic with any problems, questions or concerns. We can certainly see the patient much sooner if necessary.  Total encounter time:*** minutes*in face-to-face visit time, chart review, lab review, care coordination, order entry, and documentation of the encounter time.    Lillard Anes, NP 07/29/23 3:34 PM Medical Oncology and Hematology [facility] [address] [place], [place] [address] Tel. [phone]    Fax. [phone]  *Total Encounter Time as defined by the Centers for Medicare and Medicaid Services includes, in addition to the face-to-face time of a patient visit (documented in the note above) non-face-to-face time: obtaining and reviewing outside history, ordering and reviewing medications, tests or procedures, care coordination (communications with other health care professionals or caregivers) and documentation in the medical record.

## 2023-07-31 ENCOUNTER — Inpatient Hospital Stay: Payer: Medicare HMO | Attending: Adult Health | Admitting: Adult Health

## 2023-07-31 VITALS — BP 118/61 | HR 84 | Temp 97.4°F | Resp 18 | Ht 63.0 in | Wt 161.7 lb

## 2023-07-31 DIAGNOSIS — Z853 Personal history of malignant neoplasm of breast: Secondary | ICD-10-CM | POA: Diagnosis not present

## 2023-07-31 DIAGNOSIS — Z923 Personal history of irradiation: Secondary | ICD-10-CM | POA: Insufficient documentation

## 2023-07-31 DIAGNOSIS — C50311 Malignant neoplasm of lower-inner quadrant of right female breast: Secondary | ICD-10-CM | POA: Diagnosis not present

## 2023-07-31 DIAGNOSIS — Z87891 Personal history of nicotine dependence: Secondary | ICD-10-CM | POA: Insufficient documentation

## 2023-07-31 DIAGNOSIS — Z170 Estrogen receptor positive status [ER+]: Secondary | ICD-10-CM | POA: Diagnosis not present

## 2023-07-31 NOTE — Assessment & Plan Note (Signed)
Parneet is a 72 year old woman with stage IIb right-sided estrogen positive breast cancer diagnosed in March 2015 status post right lumpectomy, adjuvant radiation, and 5 years of antiestrogen therapy with tamoxifen which completed in November 2020.  History of right breast cancer: Marketa has no clinical or radiographic sign of breast cancer recurrence.  I recommended continued annual mammograms next due in September 2025. Bone health: Her most recent bone density testing in 2023 was normal.  Repeat is not recommended until 2028. Health maintenance: Recommended continued healthy diet, exercise, and staying up-to-date with her other cancer screenings and follow-up with her primary care provider.  Tamia will return in 1 year for continued long-term follow-up at her request.

## 2023-10-22 ENCOUNTER — Other Ambulatory Visit: Payer: Self-pay | Admitting: Internal Medicine

## 2023-10-22 DIAGNOSIS — E785 Hyperlipidemia, unspecified: Secondary | ICD-10-CM

## 2024-01-04 DIAGNOSIS — Z9189 Other specified personal risk factors, not elsewhere classified: Secondary | ICD-10-CM | POA: Diagnosis not present

## 2024-01-04 DIAGNOSIS — Z170 Estrogen receptor positive status [ER+]: Secondary | ICD-10-CM | POA: Diagnosis not present

## 2024-01-04 DIAGNOSIS — C50411 Malignant neoplasm of upper-outer quadrant of right female breast: Secondary | ICD-10-CM | POA: Diagnosis not present

## 2024-01-04 DIAGNOSIS — K760 Fatty (change of) liver, not elsewhere classified: Secondary | ICD-10-CM | POA: Diagnosis not present

## 2024-01-04 DIAGNOSIS — E66811 Obesity, class 1: Secondary | ICD-10-CM | POA: Diagnosis not present

## 2024-01-15 ENCOUNTER — Other Ambulatory Visit: Payer: Self-pay | Admitting: Internal Medicine

## 2024-01-15 DIAGNOSIS — E785 Hyperlipidemia, unspecified: Secondary | ICD-10-CM

## 2024-01-15 NOTE — Telephone Encounter (Signed)
 Copied from CRM [id number]. Topic: Clinical - Medication Refill >> Jan 15, 2024  2:53 PM Efraim Kaufmann C wrote: Most Recent Primary Care Visit:  Provider: Etta Grandchild  Department: [facility]  Visit Type: OFFICE VISIT  Date: 07/17/2023  Medication: rosuvastatin (CRESTOR) 10 MG tablet   Has the patient contacted their pharmacy? No (Agent: If no, request that the patient contact the pharmacy for the refill. If patient does not wish to contact the pharmacy document the reason why and proceed with request.) (Agent: If yes, when and what did the pharmacy advise?)  Is this the correct pharmacy for this prescription? Yes If no, delete pharmacy and type the correct one.  This is the patient's preferred pharmacy:  [facility] [phone] [place], [place] - [address] [place] [address] Phone: [phone] Fax: [phone]   Has the prescription been filled recently? No  Is the patient out of the medication? Yes  Has the patient been seen for an appointment in the last year OR does the patient have an upcoming appointment? Yes  Can we respond through MyChart? No  Agent: Please be advised that Rx refills may take up to 3 business days. We ask that you follow-up with your pharmacy.

## 2024-03-09 ENCOUNTER — Other Ambulatory Visit: Payer: Self-pay | Admitting: Internal Medicine

## 2024-03-09 DIAGNOSIS — E785 Hyperlipidemia, unspecified: Secondary | ICD-10-CM

## 2024-03-13 ENCOUNTER — Other Ambulatory Visit: Payer: Self-pay | Admitting: Internal Medicine

## 2024-03-13 DIAGNOSIS — E785 Hyperlipidemia, unspecified: Secondary | ICD-10-CM

## 2024-03-21 ENCOUNTER — Other Ambulatory Visit: Payer: Self-pay | Admitting: Internal Medicine

## 2024-03-21 DIAGNOSIS — E785 Hyperlipidemia, unspecified: Secondary | ICD-10-CM

## 2024-03-25 ENCOUNTER — Ambulatory Visit (INDEPENDENT_AMBULATORY_CARE_PROVIDER_SITE_OTHER): Payer: Medicare HMO

## 2024-03-25 VITALS — Ht 63.0 in | Wt 161.0 lb

## 2024-03-25 DIAGNOSIS — Z0000 Encounter for general adult medical examination without abnormal findings: Secondary | ICD-10-CM | POA: Diagnosis not present

## 2024-03-25 DIAGNOSIS — Z1231 Encounter for screening mammogram for malignant neoplasm of breast: Secondary | ICD-10-CM

## 2024-03-25 DIAGNOSIS — Z1211 Encounter for screening for malignant neoplasm of colon: Secondary | ICD-10-CM | POA: Diagnosis not present

## 2024-03-25 DIAGNOSIS — Z0100 Encounter for examination of eyes and vision without abnormal findings: Secondary | ICD-10-CM

## 2024-03-25 NOTE — Patient Instructions (Signed)
 Ms. [name] [name] , Thank you for taking time out of your busy schedule to complete your Annual Wellness Visit with me. I enjoyed our conversation and look forward to speaking with you again next year. I, as well as your care team,  appreciate your ongoing commitment to your health goals. Please review the following plan we discussed and let me know if I can assist you in the future. Your Game plan/ To Do List    Referrals: If you haven't heard from the office you've been referred to, please reach out to them at the phone provided.  Referral for a Screening Mammogram and a Screening Colonoscopy w/Dr Tova Fresh.  Also referred patient to Dr Alto Atta for a routine eye exam. Follow up Visits: Next Medicare AWV with our clinical staff: 03/26/2025   Have you seen your provider in the last 6 months (3 months if uncontrolled diabetes)? Yes Next Office Visit with your provider: 04/10/2024  Clinician Recommendations:  Aim for 30 minutes of exercise or brisk walking, 6-8 glasses of water, and 5 servings of fruits and vegetables each day. Educated and advised on getting the COVID and Tdap (Tetenus) vaccines at [facility] in 2025.      This is a list of the screening recommended for you and due dates:  Health Maintenance  Topic Date Due   DTaP/Tdap/Td vaccine (1 - Tdap) Never done   Colon Cancer Screening  07/19/2022   COVID-19 Vaccine (5 - 2024-25 season) 06/18/2023   Flu Shot  05/17/2024   Mammogram  07/03/2024   Medicare Annual Wellness Visit  03/25/2025   Pneumonia Vaccine  Completed   DEXA scan (bone density measurement)  Completed   Hepatitis C Screening  Completed   Zoster (Shingles) Vaccine  Completed   HPV Vaccine  Aged Out   Meningitis B Vaccine  Aged Out    Advanced directives: (Provided) Advance directive discussed with you today. I have provided a copy for you to complete at home and have notarized. Once this is complete, please bring a copy in to our office so we can scan it into your chart.   Advance Care Planning is important because it:  [x]  Makes sure you receive the medical care that is consistent with your values, goals, and preferences  [x]  It provides guidance to your family and loved ones and reduces their decisional burden about whether or not they are making the right decisions based on your wishes.  Follow the link provided in your after visit summary or read over the paperwork we have mailed to you to help you started getting your Advance Directives in place. If you need assistance in completing these, please reach out to us  so that we can help you!

## 2024-03-25 NOTE — Progress Notes (Signed)
 Subjective:   [name] [name] is a 73 y.o. who presents for a Medicare Wellness preventive visit.  As a reminder, Annual Wellness Visits don't include a physical exam, and some assessments may be limited, especially if this visit is performed virtually. We may recommend an in-person follow-up visit with your provider if needed.  Visit Complete: Virtual I connected with  [name] [name] on 03/25/24 by a audio enabled telemedicine application and verified that I am speaking with the correct person using two identifiers.  Patient Location: Home  Provider Location: Office/Clinic  I discussed the limitations of evaluation and management by telemedicine. The patient expressed understanding and agreed to proceed.  Vital Signs: Because this visit was a virtual/telehealth visit, some criteria may be missing or patient reported. Any vitals not documented were not able to be obtained and vitals that have been documented are patient reported.  VideoDeclined- This patient declined Librarian, academic. Therefore the visit was completed with audio only.  Persons Participating in Visit: Patient.  AWV Questionnaire: No: Patient Medicare AWV questionnaire was not completed prior to this visit.  Cardiac Risk Factors include: advanced age (>36men, >8 women);hypertension;dyslipidemia     Objective:     Today's Vitals   03/25/24 0937  Weight: 161 lb (73 kg)  Height: 5\' 3"  (1.6 m)   Body mass index is 28.52 kg/m.     03/25/2024    9:35 AM 03/16/2023   10:36 AM 03/15/2022    3:51 PM 11/24/2021    1:20 PM 07/22/2020   10:06 AM 09/08/2016   11:07 PM 09/07/2016   10:14 AM  Advanced Directives  Does Patient Have a Medical Advance Directive? No No No No No No No  Would patient like information on creating a medical advance directive? Yes (MAU/Ambulatory/Procedural Areas - Information given) No - Patient declined No - Patient declined No - Patient declined No -  Patient declined      Current Medications (verified) Outpatient Encounter Medications as of 03/25/2024  Medication Sig   Cholecalciferol (VITAMIN D3) 1000 units CAPS Take by mouth daily.   ferrous sulfate 324 MG TBEC Take 324 mg by mouth.   hydrOXYzine  (ATARAX ) 10 MG tablet Take 1 tablet (10 mg total) by mouth 3 (three) times daily as needed.   rosuvastatin  (CRESTOR ) 10 MG tablet TAKE 1 TABLET (10 MG TOTAL) BY MOUTH DAILY. SCHEDULE AN APPT FOR FURTHER REFILLS   zinc gluconate 50 MG tablet Take 50 mg by mouth daily.   [DISCONTINUED] ciclopirox  (PENLAC ) 8 % solution Apply topically at bedtime. Apply over nail and surrounding skin. Apply daily over previous coat. After seven (7) days, may remove with alcohol and continue cycle.   [DISCONTINUED] clobetasol  ointment (TEMOVATE ) 0.05 % Apply 1 Application topically 2 (two) times daily.   No facility-administered encounter medications on file as of 03/25/2024.    Allergies (verified) Peanut-containing drug products and Codeine   History: Past Medical History:  Diagnosis Date   Arthritis    right knee   Cancer (HCC)    breast   Full dentures    Personal history of radiation therapy    Radiation    Left Breast   Wears glasses    Past Surgical History:  Procedure Laterality Date   APPENDECTOMY     AXILLARY SENTINEL NODE BIOPSY Right 01/29/2014   Procedure: RIGHT AXILLARY SENTINEL LYMPHNODE BIOPSY;  Surgeon: Levert Ready, MD;  Location: [facility];  Service: General;  Laterality: Right;   BREAST  LUMPECTOMY Right 12/16/2013   Procedure: LUMPECTOMY;  Surgeon: Levert Ready, MD;  Location: [facility];  Service: General;  Laterality: Right;   TUBAL LIGATION     Family History  Problem Relation Age of Onset   Hypertension Sister    Heart failure Sister    Diabetes Sister    Diabetes Brother    Hypertension Brother    Diabetes Brother    Breast cancer Neg Hx    Social History   Socioeconomic History    Marital status: Widowed    Spouse name: Not on file   Number of children: 2   Years of education: Not on file   Highest education level: Not on file  Occupational History   Occupation: COOK    Employer: [employer]  Tobacco Use   Smoking status: Former    Current packs/day: 0.00    Types: Cigarettes    Quit date: 12/10/1990    Years since quitting: 33.3   Smokeless tobacco: Never  Vaping Use   Vaping status: Never Used  Substance and Sexual Activity   Alcohol use: No   Drug use: No   Sexual activity: Not Currently  Other Topics Concern   Not on file  Social History Narrative   Not on file   Social Drivers of Health   Financial Resource Strain: Low Risk  (03/25/2024)   Overall Financial Resource Strain (CARDIA)    Difficulty of Paying Living Expenses: Not hard at all  Food Insecurity: No Food Insecurity (03/25/2024)   Hunger Vital Sign    Worried About Running Out of Food in the Last Year: Never true    Ran Out of Food in the Last Year: Never true  Transportation Needs: No Transportation Needs (03/25/2024)   PRAPARE - Administrator, Civil Service (Medical): No    Lack of Transportation (Non-Medical): No  Physical Activity: Sufficiently Active (03/25/2024)   Exercise Vital Sign    Days of Exercise per Week: 7 days    Minutes of Exercise per Session: 60 min  Stress: No Stress Concern Present (03/25/2024)   [employer] of Occupational Health - Occupational Stress Questionnaire    Feeling of Stress : Not at all  Social Connections: Moderately Integrated (03/25/2024)   Social Connection and Isolation Panel [NHANES]    Frequency of Communication with Friends and Family: More than three times a week    Frequency of Social Gatherings with Friends and Family: More than three times a week    Attends Religious Services: More than 4 times per year    Active Member of [employer] or Organizations: Yes    Attends Banker Meetings: More than 4 times per year     Marital Status: Widowed    Tobacco Counseling Counseling given: No    Clinical Intake:  Pre-visit preparation completed: Yes  Pain : No/denies pain     BMI - recorded: 28.52 Nutritional Risks: None Diabetes: No  Lab Results  Component Value Date   HGBA1C 5.9 07/17/2023   HGBA1C 6.2 09/22/2021   HGBA1C 6.2 10/19/2018     How often do you need to have someone help you when you read instructions, pamphlets, or other written materials from your doctor or pharmacy?: 1 - Never  Interpreter Needed?: No  Information entered by :: Kandy Orris, CMA   Activities of Daily Living     03/25/2024    9:40 AM  In your present state of health, do you have any  difficulty performing the following activities:  Hearing? 0  Vision? 0  Difficulty concentrating or making decisions? 0  Walking or climbing stairs? 0  Dressing or bathing? 0  Doing errands, shopping? 0  Preparing Food and eating ? N  Using the Toilet? N  In the past six months, have you accidently leaked urine? Y  Comment wears a pantyliner  Do you have problems with loss of bowel control? N  Managing your Medications? N  Managing your Finances? N  Housekeeping or managing your Housekeeping? N    Patient Care Team: Arcadio Knuckles, MD as PCP - General (Internal Medicine) Tami Falcon, MD as Consulting Physician (Gastroenterology) Debbie Fails, Laura Polio, NP as Nurse Practitioner (Hematology and Oncology) Tolbert Fothergill, MD as Referring Physician (Internal Medicine) Alto Atta Scot Cutter, MD as Consulting Physician (Ophthalmology)  I have updated your Care Teams any recent Medical Services you may have received from other providers in the past year.     Assessment:    This is a routine wellness examination for [name] [name].  Hearing/Vision screen Hearing Screening - Comments:: Denies hearing difficulties   Vision Screening - Comments:: Wears rx glasses - due to see Dr Alto Atta   Goals Addressed                This Visit's Progress     Patient Stated (pt-stated)        Patient stated she plans to continue exercising and will drink more water       Depression Screen    03/25/2024    9:42 AM 03/15/2022    3:56 PM 09/22/2021   10:12 AM 10/03/2017   10:58 AM  PHQ 2/9 Scores  PHQ - 2 Score 0 0 0 0  PHQ- 9 Score 0       Fall Risk     03/25/2024    9:41 AM 03/16/2023   10:35 AM 03/15/2022    3:53 PM 09/22/2021   10:11 AM 10/03/2017   10:58 AM  Fall Risk   Falls in the past year? 0 1 0 0 No  Number falls in past yr: 0 0 0    Injury with Fall? 0 0 0    Risk for fall due to : No Fall Risks No Fall Risks No Fall Risks    Follow up Falls evaluation completed;Falls prevention discussed Falls prevention discussed Falls evaluation completed      MEDICARE RISK AT HOME:  Medicare Risk at Home Any stairs in or around the home?: No If so, are there any without handrails?: No Home free of loose throw rugs in walkways, pet beds, electrical cords, etc?: Yes Adequate lighting in your home to reduce risk of falls?: Yes Life alert?: No Use of a cane, walker or w/c?: No Grab bars in the bathroom?: No Shower chair or bench in shower?: Yes Elevated toilet seat or a handicapped toilet?: Yes  TIMED UP AND GO:  Was the test performed?  No  Cognitive Function: 6CIT completed        03/25/2024    9:44 AM 03/16/2023   10:36 AM 03/15/2022    4:01 PM  6CIT Screen  What Year? 0 points 0 points 0 points  What month? 0 points 0 points 0 points  What time? 0 points 0 points 0 points  Count back from 20 0 points 0 points 0 points  Months in reverse 0 points 0 points 0 points  Repeat phrase 0 points 0 points 0 points  Total Score 0  points 0 points 0 points    Immunizations Immunization History  Administered Date(s) Administered   Fluad Quad(high Dose 65+) 09/22/2021   Fluad Trivalent(High Dose 65+) 07/17/2023   PFIZER(Purple Top)SARS-COV-2 Vaccination 02/06/2020, 03/02/2020, 09/21/2020   PNEUMOCOCCAL  CONJUGATE-20 09/22/2021   Pfizer Covid-19 Vaccine Bivalent Booster 59yrs & up 07/15/2021   Zoster Recombinant(Shingrix) 11/07/2019, 01/05/2020    Screening Tests Health Maintenance  Topic Date Due   DTaP/Tdap/Td (1 - Tdap) Never done   Colonoscopy  07/19/2022   COVID-19 Vaccine (5 - 2024-25 season) 06/18/2023   INFLUENZA VACCINE  05/17/2024   MAMMOGRAM  07/03/2024   Medicare Annual Wellness (AWV)  03/25/2025   Pneumonia Vaccine 47+ Years old  Completed   DEXA SCAN  Completed   Hepatitis C Screening  Completed   Zoster Vaccines- Shingrix  Completed   HPV VACCINES  Aged Out   Meningococcal B Vaccine  Aged Out    Health Maintenance  Health Maintenance Due  Topic Date Due   DTaP/Tdap/Td (1 - Tdap) Never done   Colonoscopy  07/19/2022   COVID-19 Vaccine (5 - 2024-25 season) 06/18/2023   Health Maintenance Items Addressed:  Mammogram ordered, Referral sent to GI for colonoscopy  Additional Screening:  Vision Screening: Recommended annual ophthalmology exams for early detection of glaucoma and other disorders of the eye. Referral to Dr Alto Atta for a routine eye exam.    Dental Screening: Recommended annual dental exams for proper oral hygiene  Community Resource Referral / Chronic Care Management: CRR required this visit?  No   CCM required this visit?  No   Plan:    I have personally reviewed and noted the following in the patient's chart:   Medical and social history Use of alcohol, tobacco or illicit drugs  Current medications and supplements including opioid prescriptions. Patient is not currently taking opioid prescriptions. Functional ability and status Nutritional status Physical activity Advanced directives List of other physicians Hospitalizations, surgeries, and ER visits in previous 12 months Vitals Screenings to include cognitive, depression, and falls Referrals and appointments  In addition, I have reviewed and discussed with patient certain  preventive protocols, quality metrics, and best practice recommendations. A written personalized care plan for preventive services as well as general preventive health recommendations were provided to patient.   Patria Bookbinder, CMA   03/25/2024   After Visit Summary: (Mail) Due to this being a telephonic visit, the after visit summary with patients personalized plan was offered to patient via mail   Notes: Nothing significant to report at this time.

## 2024-04-02 ENCOUNTER — Other Ambulatory Visit (HOSPITAL_COMMUNITY): Payer: Self-pay | Admitting: Gastroenterology

## 2024-04-02 DIAGNOSIS — E782 Mixed hyperlipidemia: Secondary | ICD-10-CM | POA: Diagnosis not present

## 2024-04-02 DIAGNOSIS — R9389 Abnormal findings on diagnostic imaging of other specified body structures: Secondary | ICD-10-CM

## 2024-04-02 DIAGNOSIS — R933 Abnormal findings on diagnostic imaging of other parts of digestive tract: Secondary | ICD-10-CM | POA: Diagnosis not present

## 2024-04-02 DIAGNOSIS — I10 Essential (primary) hypertension: Secondary | ICD-10-CM | POA: Diagnosis not present

## 2024-04-02 DIAGNOSIS — K869 Disease of pancreas, unspecified: Secondary | ICD-10-CM

## 2024-04-02 DIAGNOSIS — Z1211 Encounter for screening for malignant neoplasm of colon: Secondary | ICD-10-CM | POA: Diagnosis not present

## 2024-04-10 ENCOUNTER — Ambulatory Visit: Payer: Self-pay | Admitting: Internal Medicine

## 2024-04-10 ENCOUNTER — Ambulatory Visit (INDEPENDENT_AMBULATORY_CARE_PROVIDER_SITE_OTHER): Admitting: Internal Medicine

## 2024-04-10 ENCOUNTER — Encounter: Payer: Self-pay | Admitting: Internal Medicine

## 2024-04-10 VITALS — BP 122/76 | HR 74 | Temp 98.4°F | Resp 16 | Ht 63.0 in | Wt 163.6 lb

## 2024-04-10 DIAGNOSIS — E66811 Obesity, class 1: Secondary | ICD-10-CM | POA: Insufficient documentation

## 2024-04-10 DIAGNOSIS — E785 Hyperlipidemia, unspecified: Secondary | ICD-10-CM | POA: Diagnosis not present

## 2024-04-10 DIAGNOSIS — R42 Dizziness and giddiness: Secondary | ICD-10-CM | POA: Diagnosis not present

## 2024-04-10 DIAGNOSIS — I10 Essential (primary) hypertension: Secondary | ICD-10-CM | POA: Diagnosis not present

## 2024-04-10 DIAGNOSIS — N1831 Chronic kidney disease, stage 3a: Secondary | ICD-10-CM | POA: Diagnosis not present

## 2024-04-10 DIAGNOSIS — R9431 Abnormal electrocardiogram [ECG] [EKG]: Secondary | ICD-10-CM

## 2024-04-10 LAB — LIPID PANEL
Cholesterol: 123 mg/dL (ref 0–200)
HDL: 48.1 mg/dL (ref >39.00)
LDL Cholesterol: 51 mg/dL (ref 0–99)
NonHDL: 75.39
Total CHOL/HDL Ratio: 3
Triglycerides: 123 mg/dL (ref 0.0–149.0)
VLDL: 24.6 mg/dL (ref 0.0–40.0)

## 2024-04-10 LAB — HEPATIC FUNCTION PANEL
ALT: 11 U/L (ref 0–35)
AST: 17 U/L (ref 0–37)
Albumin: 4.6 g/dL (ref 3.5–5.2)
Alkaline Phosphatase: 75 U/L (ref 39–117)
Bilirubin, Direct: 0.1 mg/dL (ref 0.0–0.3)
Total Bilirubin: 0.8 mg/dL (ref 0.2–1.2)
Total Protein: 7.6 g/dL (ref 6.0–8.3)

## 2024-04-10 LAB — URINALYSIS, ROUTINE W REFLEX MICROSCOPIC
Bilirubin Urine: NEGATIVE
Hgb urine dipstick: NEGATIVE
Nitrite: NEGATIVE
Specific Gravity, Urine: >=1.03 — AB (ref 1.000–1.030)
Total Protein, Urine: NEGATIVE
Urine Glucose: NEGATIVE
Urobilinogen, UA: 1 (ref 0.0–1.0)
pH: 6 (ref 5.0–8.0)

## 2024-04-10 LAB — CBC WITH DIFFERENTIAL/PLATELET
Basophils Absolute: 0 10*3/uL (ref 0.0–0.1)
Basophils Relative: 0.8 % (ref 0.0–3.0)
Eosinophils Absolute: 0.1 10*3/uL (ref 0.0–0.7)
Eosinophils Relative: 1.3 % (ref 0.0–5.0)
HCT: 43.2 % (ref 36.0–46.0)
Hemoglobin: 14.1 g/dL (ref 12.0–15.0)
Lymphocytes Relative: 30.9 % (ref 12.0–46.0)
Lymphs Abs: 1.8 10*3/uL (ref 0.7–4.0)
MCHC: 32.6 g/dL (ref 30.0–36.0)
MCV: 82.4 fl (ref 78.0–100.0)
Monocytes Absolute: 0.4 10*3/uL (ref 0.1–1.0)
Monocytes Relative: 7.3 % (ref 3.0–12.0)
Neutro Abs: 3.4 10*3/uL (ref 1.4–7.7)
Neutrophils Relative %: 59.7 % (ref 43.0–77.0)
Platelets: 158 10*3/uL (ref 150.0–400.0)
RBC: 5.25 Mil/uL — ABNORMAL HIGH (ref 3.87–5.11)
RDW: 13.7 % (ref 11.5–15.5)
WBC: 5.7 10*3/uL (ref 4.0–10.5)

## 2024-04-10 LAB — BASIC METABOLIC PANEL WITH GFR
BUN: 22 mg/dL (ref 6–23)
CO2: 29 meq/L (ref 19–32)
Calcium: 10.2 mg/dL (ref 8.4–10.5)
Chloride: 103 meq/L (ref 96–112)
Creatinine, Ser: 1.11 mg/dL (ref 0.40–1.20)
GFR: 48.92 mL/min — ABNORMAL LOW (ref >60.00)
Glucose, Bld: 95 mg/dL (ref 70–99)
Potassium: 4.2 meq/L (ref 3.5–5.1)
Sodium: 139 meq/L (ref 135–145)

## 2024-04-10 LAB — TROPONIN I (HIGH SENSITIVITY): High Sens Troponin I: 3 ng/L (ref 2–17)

## 2024-04-10 MED ORDER — ROSUVASTATIN CALCIUM 10 MG PO TABS: 10.0000 mg | ORAL_TABLET | Freq: Every day | ORAL | 1 refills | Status: DC

## 2024-04-10 MED ORDER — EMPAGLIFLOZIN 10 MG PO TABS: 10.0000 mg | ORAL_TABLET | Freq: Every day | ORAL | 1 refills | Status: DC

## 2024-04-10 NOTE — Progress Notes (Signed)
 Subjective:  [name] ID: [name] [name], female    DOB: 1951-07-06  Age: 73 y.o. MRN: [id number]  CC: Medical Management of Chronic Issues (Overdue 6 month follow up and medication refill )   HPI [name] [name] presents for f/up -----  Discussed the use of AI scribe software for clinical note transcription with the [name], [name] gave verbal consent to proceed.  History of Present Illness   [name] [name] is a 73 year old female [name] presents for a follow-up regarding an upcoming colonoscopy and MRI of the abdomen.  She is scheduled for a colonoscopy next week and an MRI of the abdomen this Friday. The MRI is being conducted due to a consultation where a kidney issue was mentioned, although she is not aware of any specific diagnosis. She recalls having been seen last year around June without any mention of kidney issues in her blood work.  She experiences occasional pains in her sides over the past couple of months. These pains occur sporadically, whether she is lying down or sitting up. No blood in her urine and she has not had a recent urine test.  No dizziness, lightheadedness, chest pain, or shortness of breath during daily activities, including climbing fourteen flights of stairs multiple times a day. She does not use her stair lift due to a fear of heights and considers the stair climbing as exercise. Occasionally, she feels lightheaded in the morning if she moves quickly without eating, but this is not severe enough to cause concern for fainting.       Outpatient Medications Prior to Visit  Medication Sig Dispense Refill   Cholecalciferol (VITAMIN D3) 1000 units CAPS Take by mouth daily.     zinc gluconate 50 MG tablet Take 50 mg by mouth daily.     rosuvastatin  (CRESTOR ) 10 MG tablet TAKE 1 TABLET (10 MG TOTAL) BY MOUTH DAILY. SCHEDULE AN APPT FOR FURTHER REFILLS 30 tablet 0   ferrous sulfate 324 MG TBEC Take 324 mg by mouth.     hydrOXYzine  (ATARAX ) 10 MG tablet  Take 1 tablet (10 mg total) by mouth 3 (three) times daily as needed. 30 tablet 0   No facility-administered medications prior to visit.    ROS Review of Systems  Objective:  BP 122/76 (BP Location: Left Arm, [name] Position: Sitting, Cuff Size: Normal)   Pulse 74   Temp 98.4 F (36.9 C) (Oral)   Resp 16   Ht 5' 3 (1.6 m)   Wt 163 lb 9.6 oz (74.2 kg)   SpO2 97%   BMI 28.98 kg/m   BP Readings from Last 3 Encounters:  04/10/24 122/76  07/31/23 118/61  07/17/23 134/78    Wt Readings from Last 3 Encounters:  04/10/24 163 lb 9.6 oz (74.2 kg)  03/25/24 161 lb (73 kg)  07/31/23 161 lb 11.2 oz (73.3 kg)    Physical Exam Vitals reviewed.  Constitutional:      Appearance: Normal appearance.  HENT:     Nose: Nose normal.     Mouth/Throat:     Mouth: Mucous membranes are moist.   Eyes:     General: No scleral icterus.    Conjunctiva/sclera: Conjunctivae normal.    Cardiovascular:     Rate and Rhythm: Normal rate and regular rhythm.     Heart sounds: No murmur heard.    No friction rub. No gallop.     Comments: EKG-- NSR, 69 bpm NS T wave changes - new No LVH or  Q waves Pulmonary:     Effort: Pulmonary effort is normal.     Breath sounds: No stridor. No wheezing, rhonchi or rales.  Abdominal:     General: Abdomen is flat.     Palpations: There is no mass.     Tenderness: There is no abdominal tenderness. There is no guarding.     Hernia: No hernia is present.   Musculoskeletal:        General: Normal range of motion.     Right lower leg: No edema.     Left lower leg: No edema.   Skin:    General: Skin is warm and dry.   Neurological:     General: No focal deficit present.     Mental Status: She is alert. Mental status is at baseline.   Psychiatric:        Mood and Affect: Mood normal.        Behavior: Behavior normal.     Lab Results  Component Value Date   WBC 5.7 04/10/2024   HGB 14.1 04/10/2024   HCT 43.2 04/10/2024   PLT 158.0 04/10/2024    GLUCOSE 95 04/10/2024   CHOL 123 04/10/2024   TRIG 123.0 04/10/2024   HDL 48.10 04/10/2024   LDLDIRECT 110.0 07/17/2023   LDLCALC 51 04/10/2024   ALT 11 04/10/2024   AST 17 04/10/2024   NA 139 04/10/2024   K 4.2 04/10/2024   CL 103 04/10/2024   CREATININE 1.11 04/10/2024   BUN 22 04/10/2024   CO2 29 04/10/2024   TSH 3.25 07/17/2023   HGBA1C 5.9 07/17/2023    MM 3D SCREENING MAMMOGRAM BILATERAL BREAST Result Date: 07/05/2023 CLINICAL DATA:  Screening. EXAM: DIGITAL SCREENING BILATERAL MAMMOGRAM WITH TOMOSYNTHESIS AND CAD TECHNIQUE: Bilateral screening digital craniocaudal and mediolateral oblique mammograms were obtained. Bilateral screening digital breast tomosynthesis was performed. The images were evaluated with computer-aided detection. COMPARISON:  Previous exam(s). ACR Breast Density Category b: There are scattered areas of fibroglandular density. FINDINGS: There are no findings suspicious for malignancy. IMPRESSION: No mammographic evidence of malignancy. A result letter of this screening mammogram will be mailed directly to the [name]. RECOMMENDATION: Screening mammogram in one year. (Code:SM-B-01Y) BI-RADS CATEGORY  1: Negative. Electronically Signed   By: Toribio Agreste M.D.   On: 07/05/2023 11:01    Assessment & Plan:  Primary hypertension- BP is well controlled. -     Basic metabolic panel with GFR; Future -     CBC with Differential/Platelet; Future -     Urinalysis, Routine w reflex microscopic; Future -     EKG 12-Lead  Hyperlipidemia with target LDL less than 130 -     Lipid panel; Future -     Hepatic function panel; Future -     Rosuvastatin  Calcium ; Take 1 tablet (10 mg total) by mouth daily.  Dispense: 90 tablet; Refill: 1  Stage 3a chronic kidney disease (HCC)- Will start an SGLT-2 inhibitor. -     Basic metabolic panel with GFR; Future -     Urinalysis, Routine w reflex microscopic; Future -     Empagliflozin ; Take 1 tablet (10 mg total) by mouth daily  before breakfast.  Dispense: 90 tablet; Refill: 1  Dizziness -     EKG 12-Lead -     Troponin I (High Sensitivity); Future -     CT CORONARY MORPH W/CTA COR W/SCORE W/CA W/CM &/OR WO/CM; Future  Abnormal electrocardiogram (ECG) (EKG) -     Troponin I (High Sensitivity); Future -  CT CORONARY MORPH W/CTA COR W/SCORE W/CA W/CM &/OR WO/CM; Future     Follow-up: Return in about 6 months (around 10/10/2024).  Debby Molt, MD

## 2024-04-10 NOTE — Patient Instructions (Signed)
 Chronic Kidney Disease in Adults: What to Know Chronic kidney disease (CKD) is when lasting damage happens to the kidneys slowly over time. The kidneys are two organs that do many important things in the body. These include: Taking waste and extra fluid out of the blood to make pee (urine). Making hormones. Keeping the right amount of fluids and chemicals in the body. A small amount of kidney damage may not cause problems. You must take steps to help keep the kidney damage from getting worse. A lot of damage may cause kidney failure. Kidney failure means the kidneys can no longer work right. What are the causes? Diabetes. High blood pressure. Diseases that affect the heart and blood vessels. Other kidney diseases. Diseases that affect the body's defense system (immune system). A problem with the flow of pee. This may be caused by: Kidney stones. Cancer. An enlarged prostate, in males. A kidney infection or urinary tract infection (UTI) that keeps coming back. What increases the risk? Getting older. The chances of having CKD increase with age. A family history of kidney disease or kidney failure. Having a disease caused by genes. Taking medicines that can harm the kidneys. Being near or having contact with harmful substances. Being very overweight. Using tobacco now or in the past. What are the signs or symptoms? Common symptoms of CKD include: Feeling very tired and having less energy. Swelling of the face, legs, ankles, or feet. Throwing up or feeling like you may throw up. Not wanting to eat as much as normal. Being confused or not able to focus. Twitches and cramps in the leg muscles or other muscles. Dry, itchy skin. Other symptoms may include: Shortness of breath. Trouble sleeping. Making less pee, or making more pee, especially at night. A taste of metal in your mouth. You may also become anemic. Anemia means there's not enough red blood cells in your blood. You may get  symptoms slowly. You may not notice them until the kidney damage gets very bad. How is this diagnosed? CKD may be diagnosed based on: Tests on your blood or pee. Imaging tests, like an ultrasound or a CT scan. A kidney biopsy. For this test, a sample of kidney tissue is removed to be looked at under a microscope. These tests will help to find out how serious the CKD is. How is this treated? Often, there's no cure for CKD. Treatment can help with symptoms and help keep the disease from getting worse. Treatment may include: Treating other problems that are causing your CKD or making it worse. Diet changes. You may need to: Avoid alcohol. Avoid foods that are high in salt, potassium, phosphorous, and protein. Taking medicines for symptoms and to help control other conditions. Dialysis. This treatment gets harmful waste out of your body. It may be needed if you have kidney failure. Follow these instructions at home: Medicines Take your medicines only as told. The amount of some medicines you take may need to be changed. Do not take any new medicines, vitamins, or supplements unless your health care provider says it's okay. These may make kidney damage worse. Lifestyle Do not smoke, vape, or use nicotine or tobacco. If you drink alcohol: Limit how much you have to: 0-1 drink a day if you're female. 0-2 drinks a day if you're female. Know how much alcohol is in your drink. In the [place] one drink is one 12 oz bottle of beer (355 mL), one 5 oz glass of wine (148 mL), or one 1 oz  glass of hard liquor (44 mL). Stay at a healthy weight. If you need help, ask your provider. General instructions  Eat and drink as told. Track your blood pressure at home. Tell your provider about any changes. If you have diabetes, track your blood sugar as told. Exercise at least 30 minutes a day, 5 days a week. Keep your shots (vaccinations) up to date. Keep all follow-up visits. Your provider may need to change  your treatments over time. Where to find support American Kidney Fund: [place].com.au Kidney School: kidneyschool.org American Association of Kidney Patients: https://www.miller-montoya.com/ Where to find more information National Kidney Foundation: kidney.org Centers for Disease Control and Prevention. To learn more: Go to DiningCalendar.de. Click "Search". Type "chronic kidney disease" in the search box. Contact a health care provider if: You have new symptoms. You get symptoms of end-stage kidney disease. These include: Headaches. Numbness in your hands or feet. Leg cramps. Easy bruising. Get help right away if: You have a fever. You make less pee than usual. You have pain or bleeding when you pee or poop. You have chest pain. You have shortness of breath. These symptoms may be an emergency. Call 911 right away. Do not wait to see if the symptoms will go away. Do not drive yourself to the hospital. This information is not intended to replace advice given to you by your health care provider. Make sure you discuss any questions you have with your health care provider. Document Revised: 08/15/2023 Document Reviewed: 04/07/2023 Elsevier Patient Education  2024 [employer].

## 2024-04-12 ENCOUNTER — Ambulatory Visit (HOSPITAL_COMMUNITY)
Admission: RE | Admit: 2024-04-12 | Discharge: 2024-04-12 | Disposition: A | Discharge status: Home / Self Care | Source: Ambulatory Visit | Attending: Gastroenterology | Admitting: Gastroenterology

## 2024-04-12 ENCOUNTER — Other Ambulatory Visit (HOSPITAL_COMMUNITY): Payer: Self-pay | Admitting: Gastroenterology

## 2024-04-12 DIAGNOSIS — M47816 Spondylosis without myelopathy or radiculopathy, lumbar region: Secondary | ICD-10-CM | POA: Diagnosis not present

## 2024-04-12 DIAGNOSIS — R9389 Abnormal findings on diagnostic imaging of other specified body structures: Secondary | ICD-10-CM | POA: Insufficient documentation

## 2024-04-12 DIAGNOSIS — M51369 Other intervertebral disc degeneration, lumbar region without mention of lumbar back pain or lower extremity pain: Secondary | ICD-10-CM | POA: Diagnosis not present

## 2024-04-12 DIAGNOSIS — K869 Disease of pancreas, unspecified: Secondary | ICD-10-CM | POA: Insufficient documentation

## 2024-04-12 MED ORDER — GADOBUTROL 1 MMOL/ML IV SOLN
7.0000 mL | Freq: Once | INTRAVENOUS | Status: AC | PRN
  Administered 2024-04-12: 7 mL via INTRAVENOUS

## 2024-04-15 ENCOUNTER — Telehealth: Payer: Self-pay | Admitting: Internal Medicine

## 2024-04-15 DIAGNOSIS — K648 Other hemorrhoids: Secondary | ICD-10-CM | POA: Diagnosis not present

## 2024-04-15 DIAGNOSIS — K5730 Diverticulosis of large intestine without perforation or abscess without bleeding: Secondary | ICD-10-CM | POA: Diagnosis not present

## 2024-04-15 DIAGNOSIS — K635 Polyp of colon: Secondary | ICD-10-CM | POA: Diagnosis not present

## 2024-04-15 DIAGNOSIS — D122 Benign neoplasm of ascending colon: Secondary | ICD-10-CM | POA: Diagnosis not present

## 2024-04-15 DIAGNOSIS — Z1211 Encounter for screening for malignant neoplasm of colon: Secondary | ICD-10-CM | POA: Diagnosis not present

## 2024-04-15 NOTE — Telephone Encounter (Signed)
 She understands that she want to know what is the blood work saying.

## 2024-04-15 NOTE — Telephone Encounter (Unsigned)
 Copied from CRM [id number]. Topic: Clinical - Medication Question >> Apr 15, 2024  1:56 PM Macario HERO wrote: Reason for CRM: Patient is requesting a call back from Dr. Joshua or his nurse regarding her kidney medication and empagliflozin  (JARDIANCE ) 10 MG TABS tablet [[id number]].

## 2024-04-15 NOTE — Telephone Encounter (Signed)
 Copied from CRM [id number]. Topic: General - Call Back - No Documentation >> Apr 15, 2024  4:23 PM Chiquita SQUIBB wrote: Reason for CRM: Patient is calling nurse Jaz back regarding her medications, please contact the patient back.

## 2024-04-15 NOTE — Telephone Encounter (Signed)
 She has mild kidney disease

## 2024-04-15 NOTE — Telephone Encounter (Signed)
 Called and spoke with the patient and advised her of Dr. Joshua comments. She gave a verbal understanding.

## 2024-04-15 NOTE — Telephone Encounter (Signed)
 Patient received a letter in the mail and can't understand the numbers. Could you please comment on the labs so that I can call her back ?

## 2024-04-15 NOTE — Telephone Encounter (Signed)
 Start jardiance  to protect the kidneys

## 2024-04-16 NOTE — Telephone Encounter (Signed)
 Copied from CRM [id number]. Topic: General - Other >> Apr 16, 2024  9:20 AM Thersia BROCKS wrote: Reason for CRM: Patient called in wanting to speak with nurses, will like a callback

## 2024-04-16 NOTE — Telephone Encounter (Signed)
 Copied from CRM [id number]. Topic: General - Other >> Apr 16, 2024  9:20 AM Thersia BROCKS wrote: Reason for CRM: Patient called in wanting to speak with nurses, will like a callback >> Apr 16, 2024 12:46 PM Drema MATSU wrote: Patient to speak with nurses advised she should receive a call by the end of day today.

## 2024-04-17 ENCOUNTER — Telehealth: Payer: Self-pay | Admitting: Internal Medicine

## 2024-04-17 NOTE — Telephone Encounter (Signed)
 Copied from CRM [id number]. Topic: General - Other >> Apr 17, 2024 11:03 AM Ernestene P wrote: Pt advise nobody has contacted her back, she would like nurse to call asap.

## 2024-04-17 NOTE — Telephone Encounter (Signed)
 Copied from CRM [id number]. Topic: Clinical - Medication Question >> Apr 17, 2024 11:08 AM Thersia BROCKS wrote: Reason for CRM: Patient called in regarding empagliflozin  (JARDIANCE ) 10 MG TABS tablet , would like for a nurse to call her back as she stated she can not afford to keep taking it. Has been trying to get in contact with nurse on what she needs to do.. Stated to please leave a voicemail if they are unable to contact her

## 2024-04-18 ENCOUNTER — Telehealth: Payer: Self-pay | Admitting: Internal Medicine

## 2024-04-18 NOTE — Telephone Encounter (Signed)
 This has been handled in another telephone call.

## 2024-04-18 NOTE — Telephone Encounter (Signed)
 Patient should qualify to get Jardiance  for free from [facility]. Household size:1 Monthly Income: 2400. Patient elects to come by in office next week to sign application, has 5 weeks of samples on hand while this processes. Asks that pharmacist [place] call her when application is ready to sign at the office.

## 2024-04-18 NOTE — Telephone Encounter (Signed)
 This patient needs sone assistance possible getting her Jardiance . Patient states that this medication is 400$ for a 90 day supply. Please advise.

## 2024-04-18 NOTE — Telephone Encounter (Signed)
 Copied from CRM [id number]. Topic: General - Other >> Apr 18, 2024 11:54 AM Franky GRADE wrote: Reason for CRM: Patient would like to speak with [facility], She states Jaz was helping her with something at the office but forgot to ask her a few questions.

## 2024-04-26 ENCOUNTER — Ambulatory Visit (HOSPITAL_COMMUNITY)
Admission: RE | Admit: 2024-04-26 | Source: Ambulatory Visit | Attending: Internal Medicine | Admitting: Internal Medicine

## 2024-04-26 ENCOUNTER — Encounter (HOSPITAL_COMMUNITY): Payer: Self-pay

## 2024-04-26 NOTE — Telephone Encounter (Signed)
 Everything has been handled with the pharmacist. For patient assistance with her Jardiance 

## 2024-05-02 ENCOUNTER — Telehealth: Payer: Self-pay

## 2024-05-02 NOTE — Telephone Encounter (Signed)
 Received a call from pt ,pt want it to know the status of her [employer](Jardiance  ) application.told pt had to call company to follow up. Spoke with [employer] representative said pt needs to submit proof of income and after they can have an answer for pt. Gave pt a call back to let her know we need to send her proof of income,spoke with pt said she was going to look for her proof of income and will bring it to provider office,pt will call back once she find it.

## 2024-05-07 NOTE — Telephone Encounter (Signed)
 Received APPROVAL letter from [employer] (Jardiance ) tru 10/16/24,left a HIPAA VM at pt number, pt will received med with in 7-10 days.

## 2024-07-05 ENCOUNTER — Ambulatory Visit
Admission: RE | Admit: 2024-07-05 | Discharge: 2024-07-05 | Disposition: A | Discharge status: Home / Self Care | Source: Ambulatory Visit | Attending: Internal Medicine | Admitting: Internal Medicine

## 2024-07-05 DIAGNOSIS — Z1231 Encounter for screening mammogram for malignant neoplasm of breast: Secondary | ICD-10-CM | POA: Diagnosis not present

## 2024-09-18 ENCOUNTER — Telehealth: Payer: Self-pay

## 2024-09-18 ENCOUNTER — Telehealth: Payer: Self-pay | Admitting: Internal Medicine

## 2024-09-18 NOTE — Telephone Encounter (Signed)
 Patient dropped off document Patient Assistance Application and Handicap Placard documentation, to be filled out by provider. Patient requested to send it back via Call Patient to pick up within 7-days. Document is located in providers tray at front office.Please advise at [phone]

## 2024-09-18 NOTE — Telephone Encounter (Signed)
 Patient has provided their patient assitance forms in for renewal. These have been placed in Dr.Jones' mailbox

## 2024-09-19 DIAGNOSIS — K760 Fatty (change of) liver, not elsewhere classified: Secondary | ICD-10-CM | POA: Insufficient documentation

## 2024-09-19 DIAGNOSIS — K5730 Diverticulosis of large intestine without perforation or abscess without bleeding: Secondary | ICD-10-CM | POA: Insufficient documentation

## 2024-09-19 DIAGNOSIS — D509 Iron deficiency anemia, unspecified: Secondary | ICD-10-CM | POA: Insufficient documentation

## 2024-09-19 NOTE — Telephone Encounter (Signed)
 Paperwork has been completed and sat on Dr. Joshua desk awaiting for a signature.

## 2024-09-20 NOTE — Telephone Encounter (Signed)
 Patient has been made aware and her paperwork has been placed up front

## 2024-09-26 ENCOUNTER — Encounter (HOSPITAL_COMMUNITY): Payer: Self-pay

## 2024-09-26 ENCOUNTER — Emergency Department (HOSPITAL_COMMUNITY)
Admission: EM | Admit: 2024-09-26 | Discharge: 2024-09-26 | Disposition: A | Discharge status: Home / Self Care | Attending: Emergency Medicine | Admitting: Emergency Medicine

## 2024-09-26 ENCOUNTER — Emergency Department (HOSPITAL_COMMUNITY)

## 2024-09-26 DIAGNOSIS — R1032 Left lower quadrant pain: Secondary | ICD-10-CM | POA: Diagnosis present

## 2024-09-26 DIAGNOSIS — Z91010 Allergy to peanuts: Secondary | ICD-10-CM | POA: Diagnosis not present

## 2024-09-26 DIAGNOSIS — Z853 Personal history of malignant neoplasm of breast: Secondary | ICD-10-CM | POA: Insufficient documentation

## 2024-09-26 LAB — CBC
HCT: 46.3 % — ABNORMAL HIGH (ref 36.0–46.0)
Hemoglobin: 14.1 g/dL (ref 12.0–15.0)
MCH: 27.5 pg (ref 26.0–34.0)
MCHC: 30.5 g/dL (ref 30.0–36.0)
MCV: 90.3 fL (ref 80.0–100.0)
Platelets: 129 K/uL — ABNORMAL LOW (ref 150–400)
RBC: 5.13 MIL/uL — ABNORMAL HIGH (ref 3.87–5.11)
RDW: 13.6 % (ref 11.5–15.5)
WBC: 8.9 K/uL (ref 4.0–10.5)
nRBC: 0 % (ref 0.0–0.2)

## 2024-09-26 LAB — COMPREHENSIVE METABOLIC PANEL WITH GFR
ALT: 13 U/L (ref 0–44)
AST: 18 U/L (ref 15–41)
Albumin: 4 g/dL (ref 3.5–5.0)
Alkaline Phosphatase: 72 U/L (ref 38–126)
Anion gap: 8 (ref 5–15)
BUN: 17 mg/dL (ref 8–23)
CO2: 27 mmol/L (ref 22–32)
Calcium: 9.5 mg/dL (ref 8.9–10.3)
Chloride: 103 mmol/L (ref 98–111)
Creatinine, Ser: 0.98 mg/dL (ref 0.44–1.00)
GFR, Estimated: >60 mL/min (ref >60)
Glucose, Bld: 119 mg/dL — ABNORMAL HIGH (ref 70–99)
Potassium: 4.3 mmol/L (ref 3.5–5.1)
Sodium: 138 mmol/L (ref 135–145)
Total Bilirubin: 0.9 mg/dL (ref 0.0–1.2)
Total Protein: 7.1 g/dL (ref 6.5–8.1)

## 2024-09-26 LAB — CBG MONITORING, ED: Glucose-Capillary: 105 mg/dL — ABNORMAL HIGH (ref 70–99)

## 2024-09-26 MED ORDER — METHOCARBAMOL 500 MG PO TABS: 500.0000 mg | ORAL_TABLET | Freq: Three times a day (TID) | ORAL | 0 refills | Status: DC | PRN

## 2024-09-26 MED ORDER — KETOROLAC TROMETHAMINE 15 MG/ML IJ SOLN
15.0000 mg | Freq: Once | INTRAMUSCULAR | Status: AC
  Administered 2024-09-26: 15 mg via INTRAMUSCULAR
  Filled 2024-09-26: qty 1

## 2024-09-26 NOTE — ED Triage Notes (Signed)
 Pt c/o pain to her inner thigh and groin area on her left leg that started this morning at 0100. Tried ice and heat pack without relief. Woke up this morning and too painful to walk. Also reports dizziness and feeling sick to her stomach when she got out of bed this morning.

## 2024-09-26 NOTE — Discharge Instructions (Signed)
 The pain is likely in the muscle or ligaments in your left groin.  Infection felt less likely.  Although something like shingles could be causing the pain and a rash which are [employer].  Muscle relaxers may help.  Also some Tylenol  or Motrin.  Follow-up with your doctor as needed.

## 2024-09-26 NOTE — ED Provider Notes (Signed)
 [facility] Provider Note   CSN: [id number] Arrival date & time: 09/26/24  9263     Patient presents with: Dizziness   [name] [name] is a 73 y.o. female.    Dizziness Patient presents with left thigh pain.  Started this morning.  Good pulse.  Worse with certain movement.  No trauma.  Did have slight dizziness and pain in the leg but states it was just from the pain.    Past Medical History:  Diagnosis Date   Arthritis    right knee   Cancer (HCC)    breast   Full dentures    Personal history of radiation therapy    Radiation    Left Breast   Wears glasses     Prior to Admission medications  Medication Sig Start Date End Date Taking? Authorizing Provider  methocarbamol (ROBAXIN) 500 MG tablet Take 1 tablet (500 mg total) by mouth every 8 (eight) hours as needed. 09/26/24  Yes Patsey Lot, MD  Cholecalciferol (VITAMIN D3) 1000 units CAPS Take by mouth daily.    [provider]  empagliflozin  (JARDIANCE ) 10 MG TABS tablet Take 1 tablet (10 mg total) by mouth daily before breakfast. 04/10/24   Joshua Debby CROME, MD  rosuvastatin  (CRESTOR ) 10 MG tablet Take 1 tablet (10 mg total) by mouth daily. 04/10/24   Joshua Debby CROME, MD  zinc gluconate 50 MG tablet Take 50 mg by mouth daily.    [provider]    Allergies: Peanut-containing drug products and Codeine    Review of Systems  Neurological:  Positive for dizziness.    Updated Vital Signs BP (!) 127/59 (BP Location: Right Arm)   Pulse 79   Temp 99.7 F (37.6 C)   Resp 17   SpO2 100%   Physical Exam Vitals reviewed.  Cardiovascular:     Rate and Rhythm: Regular rhythm.  Abdominal:     Tenderness: There is no abdominal tenderness.  Musculoskeletal:        General: Tenderness present.     Cervical back: Neck supple.     Comments: Tenderness to left medial thigh.  No rash.  Initially pain with movement but after treatment feeling much  better and able to move freely.  Skin:    General: Skin is warm.     Capillary Refill: Capillary refill takes less than 2 seconds.  Neurological:     Mental Status: She is alert.     (all labs ordered are listed, but only abnormal results are displayed) Labs Reviewed  COMPREHENSIVE METABOLIC PANEL WITH GFR - Abnormal; Notable for the following components:      Result Value   Glucose, Bld 119 (*)    All other components within normal limits  CBC - Abnormal; Notable for the following components:   RBC 5.13 (*)    HCT 46.3 (*)    Platelets 129 (*)    All other components within normal limits  CBG MONITORING, ED - Abnormal; Notable for the following components:   Glucose-Capillary 105 (*)    All other components within normal limits    EKG: EKG Interpretation Date/Time:  Thursday September 26 2024 08:22:25 EST Ventricular Rate:  81 PR Interval:  172 QRS Duration:  72 QT Interval:  356 QTC Calculation: 413 R Axis:   45  Text Interpretation: Normal sinus rhythm When compared with ECG of 24-Nov-2021 13:17, PREVIOUS ECG IS PRESENT Confirmed by Cottie Cough [phone]) on 09/26/2024 10:12:02 AM  Radiology: DG Hip Unilat W or Wo Pelvis 2-3 Views Left Result Date: 09/26/2024 CLINICAL DATA:  Left inguinal and thigh pain EXAM: DG HIP (WITH OR WITHOUT PELVIS) 2-3V LEFT COMPARISON:  None Available. FINDINGS: Frontal view of the pelvis as well as frontal and frogleg lateral views of the left hip are obtained. No acute fracture, subluxation, or dislocation. There is mild symmetrical bilateral hip osteoarthritis. Sacroiliac joints are unremarkable. Soft tissues are normal. IMPRESSION: 1. No acute displaced fracture. 2. Mild symmetrical bilateral hip osteoarthritis. Electronically Signed   By: Ozell Daring M.D.   On: 09/26/2024 17:39     Procedures   Medications Ordered in the ED  ketorolac  (TORADOL ) 15 MG/ML injection 15 mg (15 mg Intramuscular Given 09/26/24 1707)                                     Medical Decision Making Amount and/or Complexity of Data Reviewed Labs: ordered. Radiology: ordered.  Risk Prescription drug management.  Patient with left thigh pain.  Differential diagnose includes musculoskeletal pain.  Also causes such as hematoma.  There is been no trauma however.  X-ray done reassuring.  Blood work reassuring.  Feeling much better after treatment.  Doubt DVT or arterial occlusion.  Feeling better.  He can have follow-up with PCP.  Will discharge with symptomatic treatment.      Final diagnoses:  Left inguinal pain    ED Discharge Orders          Ordered    methocarbamol (ROBAXIN) 500 MG tablet  Every 8 hours PRN        09/26/24 1837               Patsey Lot, MD 09/26/24 2311

## 2024-10-02 ENCOUNTER — Telehealth: Payer: Self-pay

## 2024-10-02 NOTE — Telephone Encounter (Signed)
 Gave pt a call pt is due for re-enrollment on [facility] (Jardiance ) left a HIPAA VM ,mail out pap to pt home and faxed provider portion.

## 2024-10-07 NOTE — Telephone Encounter (Signed)
 Received provider portion [facility] (Jardiance ) waiting on pt portion.

## 2024-10-09 ENCOUNTER — Other Ambulatory Visit (HOSPITAL_COMMUNITY): Payer: Self-pay

## 2024-10-10 ENCOUNTER — Other Ambulatory Visit: Payer: Self-pay | Admitting: Internal Medicine

## 2024-10-10 DIAGNOSIS — E785 Hyperlipidemia, unspecified: Secondary | ICD-10-CM

## 2024-10-14 ENCOUNTER — Ambulatory Visit: Payer: Self-pay | Admitting: Internal Medicine

## 2024-10-14 ENCOUNTER — Encounter: Payer: Self-pay | Admitting: Internal Medicine

## 2024-10-14 ENCOUNTER — Ambulatory Visit (INDEPENDENT_AMBULATORY_CARE_PROVIDER_SITE_OTHER): Admitting: Internal Medicine

## 2024-10-14 VITALS — BP 118/68 | HR 69 | Temp 97.9°F | Resp 16 | Ht 63.0 in | Wt 159.4 lb

## 2024-10-14 DIAGNOSIS — I10 Essential (primary) hypertension: Secondary | ICD-10-CM

## 2024-10-14 DIAGNOSIS — D696 Thrombocytopenia, unspecified: Secondary | ICD-10-CM | POA: Diagnosis not present

## 2024-10-14 DIAGNOSIS — D539 Nutritional anemia, unspecified: Secondary | ICD-10-CM | POA: Diagnosis not present

## 2024-10-14 DIAGNOSIS — Z0001 Encounter for general adult medical examination with abnormal findings: Secondary | ICD-10-CM

## 2024-10-14 DIAGNOSIS — Z0000 Encounter for general adult medical examination without abnormal findings: Secondary | ICD-10-CM | POA: Diagnosis not present

## 2024-10-14 DIAGNOSIS — N1831 Chronic kidney disease, stage 3a: Secondary | ICD-10-CM

## 2024-10-14 DIAGNOSIS — E785 Hyperlipidemia, unspecified: Secondary | ICD-10-CM | POA: Diagnosis not present

## 2024-10-14 DIAGNOSIS — Z23 Encounter for immunization: Secondary | ICD-10-CM | POA: Diagnosis not present

## 2024-10-14 DIAGNOSIS — K760 Fatty (change of) liver, not elsewhere classified: Secondary | ICD-10-CM

## 2024-10-14 DIAGNOSIS — D510 Vitamin B12 deficiency anemia due to intrinsic factor deficiency: Secondary | ICD-10-CM | POA: Insufficient documentation

## 2024-10-14 DIAGNOSIS — R7303 Prediabetes: Secondary | ICD-10-CM | POA: Diagnosis not present

## 2024-10-14 LAB — TSH: TSH: 3.23 u[IU]/mL (ref 0.35–5.50)

## 2024-10-14 LAB — CBC WITH DIFFERENTIAL/PLATELET
Basophils Absolute: 0 K/uL (ref 0.0–0.1)
Basophils Relative: 0.9 % (ref 0.0–3.0)
Eosinophils Absolute: 0.1 K/uL (ref 0.0–0.7)
Eosinophils Relative: 1.7 % (ref 0.0–5.0)
HCT: 43.5 % (ref 36.0–46.0)
Hemoglobin: 14.1 g/dL (ref 12.0–15.0)
Lymphocytes Relative: 39.4 % (ref 12.0–46.0)
Lymphs Abs: 1.8 K/uL (ref 0.7–4.0)
MCHC: 32.5 g/dL (ref 30.0–36.0)
MCV: 82.6 fl (ref 78.0–100.0)
Monocytes Absolute: 0.3 K/uL (ref 0.1–1.0)
Monocytes Relative: 6.3 % (ref 3.0–12.0)
Neutro Abs: 2.3 K/uL (ref 1.4–7.7)
Neutrophils Relative %: 51.7 % (ref 43.0–77.0)
Platelets: 129 K/uL — ABNORMAL LOW (ref 150.0–400.0)
RBC: 5.27 Mil/uL — ABNORMAL HIGH (ref 3.87–5.11)
RDW: 14 % (ref 11.5–15.5)
WBC: 4.5 K/uL (ref 4.0–10.5)

## 2024-10-14 LAB — PROTIME-INR
INR: 1.1 ratio — ABNORMAL HIGH (ref 0.8–1.0)
Prothrombin Time: 11.4 s (ref 9.6–13.1)

## 2024-10-14 LAB — URINALYSIS, ROUTINE W REFLEX MICROSCOPIC
Bilirubin Urine: NEGATIVE
Ketones, ur: NEGATIVE
Nitrite: NEGATIVE
Specific Gravity, Urine: 1.01 (ref 1.000–1.030)
Total Protein, Urine: NEGATIVE
Urine Glucose: >=1000 — AB
Urobilinogen, UA: 0.2 (ref 0.0–1.0)
pH: 6 (ref 5.0–8.0)

## 2024-10-14 LAB — FOLATE: Folate: 12.6 ng/mL

## 2024-10-14 LAB — VITAMIN B12: Vitamin B-12: 136 pg/mL — ABNORMAL LOW (ref 211–911)

## 2024-10-14 LAB — HEMOGLOBIN A1C: Hgb A1c MFr Bld: 6.1 % (ref 4.6–6.5)

## 2024-10-14 MED ORDER — CYANOCOBALAMIN 2000 MCG PO TABS: 2000.0000 ug | ORAL_TABLET | Freq: Every day | ORAL | 0 refills | Status: AC

## 2024-10-14 MED ORDER — EMPAGLIFLOZIN 10 MG PO TABS: 10.0000 mg | ORAL_TABLET | Freq: Every day | ORAL | 1 refills | Status: AC

## 2024-10-14 NOTE — Progress Notes (Unsigned)
 "  Subjective:  Patient ID: [name] [name], female    DOB: Mar 12, 1951  Age: 73 y.o. MRN: [id number]  CC: Hypertension, Annual Exam, and Anemia   HPI [name] [name] presents for a CPX and f/up ---  Discussed the use of AI scribe software for clinical note transcription with the patient, who gave verbal consent to proceed.  History of Present Illness [name] [name] is a 73 year old female who presents with muscle spasms and right rib cage pain.  She experienced a severe muscle spasm in her left leg, which was so intense that she was unable to walk. She took a muscle relaxer twice over a day and a half and received an injection at the hospital, which alleviated the pain. No swelling in her legs or feet, chest pain, shortness of breath, dizziness, or lightheadedness since the episode.  She has a history of muscle spasms in her back several years ago, treated with muscle relaxers. She associates previous muscle spasms with eating peanuts, which she has avoided.  She reports excruciating pain in her right rib cage when reaching back, such as when in a car. This pain has been present for about two years and occurs only with certain movements. She describes the pain as 'cruciating' when reaching back or trying to grab something.  No flu symptoms, such as cough, fever, chills, or night sweats. She drinks a lot of water and occasionally feels lightheaded if she does not eat, attributing this to not being a 'real eater.'  During a recent emergency room visit, she was told she was anemic and that her platelets were low.     Outpatient Medications Prior to Visit  Medication Sig Dispense Refill   Cholecalciferol (VITAMIN D3) 1000 units CAPS Take by mouth daily.     rosuvastatin  (CRESTOR ) 10 MG tablet TAKE 1 TABLET BY MOUTH EVERY DAY 90 tablet 1   zinc gluconate 50 MG tablet Take 50 mg by mouth daily.     empagliflozin  (JARDIANCE ) 10 MG TABS tablet Take 1 tablet (10 mg total) by  mouth daily before breakfast. 90 tablet 1   methocarbamol  (ROBAXIN ) 500 MG tablet Take 1 tablet (500 mg total) by mouth every 8 (eight) hours as needed. 8 tablet 0   No facility-administered medications prior to visit.    ROS Review of Systems  Objective:  BP 118/68 (BP Location: Left Arm, Patient Position: Sitting, Cuff Size: Normal)   Pulse 69   Temp 97.9 F (36.6 C) (Oral)   Resp 16   Ht 5' 3 (1.6 m)   Wt 159 lb 6.4 oz (72.3 kg)   SpO2 99%   BMI 28.24 kg/m   BP Readings from Last 3 Encounters:  10/14/24 118/68  09/26/24 (!) 127/59  04/10/24 122/76    Wt Readings from Last 3 Encounters:  10/14/24 159 lb 6.4 oz (72.3 kg)  04/10/24 163 lb 9.6 oz (74.2 kg)  03/25/24 161 lb (73 kg)    Physical Exam  Lab Results  Component Value Date   WBC 4.5 10/14/2024   HGB 14.1 10/14/2024   HCT 43.5 10/14/2024   PLT 129.0 (L) 10/14/2024   GLUCOSE 119 (H) 09/26/2024   CHOL 123 04/10/2024   TRIG 123.0 04/10/2024   HDL 48.10 04/10/2024   LDLDIRECT 110.0 07/17/2023   LDLCALC 51 04/10/2024   ALT 13 09/26/2024   AST 18 09/26/2024   NA 138 09/26/2024   K 4.3 09/26/2024   CL 103 09/26/2024   CREATININE 0.98  09/26/2024   BUN 17 09/26/2024   CO2 27 09/26/2024   TSH 3.23 10/14/2024   INR 1.1 (H) 10/14/2024   HGBA1C 6.1 10/14/2024    DG Hip Unilat W or Wo Pelvis 2-3 Views Left Result Date: 09/26/2024 CLINICAL DATA:  Left inguinal and thigh pain EXAM: DG HIP (WITH OR WITHOUT PELVIS) 2-3V LEFT COMPARISON:  None Available. FINDINGS: Frontal view of the pelvis as well as frontal and frogleg lateral views of the left hip are obtained. No acute fracture, subluxation, or dislocation. There is mild symmetrical bilateral hip osteoarthritis. Sacroiliac joints are unremarkable. Soft tissues are normal. IMPRESSION: 1. No acute displaced fracture. 2. Mild symmetrical bilateral hip osteoarthritis. Electronically Signed   By: Ozell Daring M.D.   On: 09/26/2024 17:39   Estimated Creatinine  Clearance: 47.3 mL/min (by C-G formula based on SCr of 0.98 mg/dL).   The ASCVD Risk score (Arnett DK, et al., 2019) failed to calculate for the following reasons:   The valid total cholesterol range is 130 to 320 mg/dL   Fibrosis 4 Score = 2.9  Fib-4 interpretation is not validated for people under 35 or over 90 years of age. However, scores under 2.0 are generally considered low risk.   Assessment & Plan:  Deficiency anemia -     Vitamin B12; Future -     CBC with Differential/Platelet; Future -     Folate; Future -     Methylmalonic acid, serum; Future  Thrombocytopenia -     Vitamin B12; Future -     CBC with Differential/Platelet; Future -     Folate; Future -     Methylmalonic acid, serum; Future  Prediabetes -     Hemoglobin A1c; Future  Primary hypertension -     TSH; Future -     Urinalysis, Routine w reflex microscopic; Future  Hyperlipidemia with target LDL less than 130 -     TSH; Future  Fatty infiltration of liver -     Protime-INR; Future  Encounter for general adult medical examination with abnormal findings  Need for immunization against influenza -     Flu vaccine HIGH DOSE PF(Fluzone Trivalent)  Vitamin B12 deficiency anemia due to intrinsic factor deficiency -     Cyanocobalamin ; Take 1 tablet (2,000 mcg total) by mouth daily.  Dispense: 90 tablet; Refill: 0  Stage 3a chronic kidney disease (HCC) -     Empagliflozin ; Take 1 tablet (10 mg total) by mouth daily before breakfast.  Dispense: 90 tablet; Refill: 1     Follow-up: Return in about 6 months (around 04/14/2025).  Debby Molt, MD "

## 2024-10-14 NOTE — Patient Instructions (Signed)

## 2024-10-15 ENCOUNTER — Other Ambulatory Visit (HOSPITAL_COMMUNITY): Payer: Self-pay

## 2024-10-18 ENCOUNTER — Ambulatory Visit

## 2024-10-18 DIAGNOSIS — E538 Deficiency of other specified B group vitamins: Secondary | ICD-10-CM

## 2024-10-18 LAB — METHYLMALONIC ACID, SERUM: Methylmalonic Acid, Quant: 245 nmol/L (ref 69–390)

## 2024-10-18 MED ORDER — CYANOCOBALAMIN 1000 MCG/ML IJ SOLN
1000.0000 ug | Freq: Once | INTRAMUSCULAR | Status: AC
  Administered 2024-10-18: 1000 ug via INTRAMUSCULAR

## 2024-10-18 NOTE — Progress Notes (Signed)
After obtaining consent, and per orders of Dr. Jones, injection of B12 was given by [place] Rylander P Lior Hoen. Patient instructed to report any adverse reaction to me immediately.  

## 2025-03-26 ENCOUNTER — Ambulatory Visit
